# Patient Record
Sex: Female | Born: 1945 | Hispanic: No | Marital: Married | Smoking: Current every day smoker
Health system: Southern US, Community
[De-identification: ages and names within clinical notes are randomized; demographics above are authoritative.]

## PROBLEM LIST (undated history)

## (undated) DIAGNOSIS — F419 Anxiety disorder, unspecified: Secondary | ICD-10-CM

## (undated) DIAGNOSIS — I10 Essential (primary) hypertension: Secondary | ICD-10-CM

## (undated) DIAGNOSIS — D381 Neoplasm of uncertain behavior of trachea, bronchus and lung: Secondary | ICD-10-CM

## (undated) DIAGNOSIS — Z87442 Personal history of urinary calculi: Secondary | ICD-10-CM

## (undated) DIAGNOSIS — K219 Gastro-esophageal reflux disease without esophagitis: Secondary | ICD-10-CM

## (undated) DIAGNOSIS — N289 Disorder of kidney and ureter, unspecified: Secondary | ICD-10-CM

## (undated) DIAGNOSIS — C801 Malignant (primary) neoplasm, unspecified: Secondary | ICD-10-CM

## (undated) DIAGNOSIS — I2510 Atherosclerotic heart disease of native coronary artery without angina pectoris: Secondary | ICD-10-CM

## (undated) DIAGNOSIS — Z9289 Personal history of other medical treatment: Secondary | ICD-10-CM

## (undated) DIAGNOSIS — M8580 Other specified disorders of bone density and structure, unspecified site: Secondary | ICD-10-CM

## (undated) DIAGNOSIS — M1990 Unspecified osteoarthritis, unspecified site: Secondary | ICD-10-CM

## (undated) HISTORY — DX: Neoplasm of uncertain behavior of trachea, bronchus and lung: D38.1

## (undated) HISTORY — PX: SKIN GRAFT: SHX250

## (undated) HISTORY — DX: Other specified disorders of bone density and structure, unspecified site: M85.80

## (undated) HISTORY — DX: Gastro-esophageal reflux disease without esophagitis: K21.9

---
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
## 2004-12-27 ENCOUNTER — Ambulatory Visit: Payer: Self-pay | Admitting: Internal Medicine

## 2005-01-08 ENCOUNTER — Ambulatory Visit: Payer: Self-pay | Admitting: Internal Medicine

## 2013-08-24 ENCOUNTER — Encounter (HOSPITAL_BASED_OUTPATIENT_CLINIC_OR_DEPARTMENT_OTHER): Payer: Self-pay | Admitting: Emergency Medicine

## 2013-08-24 ENCOUNTER — Emergency Department (HOSPITAL_BASED_OUTPATIENT_CLINIC_OR_DEPARTMENT_OTHER)
Admission: EM | Admit: 2013-08-24 | Discharge: 2013-08-24 | Disposition: A | Discharge status: Other Acute Inpatient Hospital | Payer: Medicare Other | Attending: Emergency Medicine | Admitting: Emergency Medicine

## 2013-08-24 ENCOUNTER — Emergency Department (HOSPITAL_BASED_OUTPATIENT_CLINIC_OR_DEPARTMENT_OTHER): Payer: Medicare Other

## 2013-08-24 DIAGNOSIS — K529 Noninfective gastroenteritis and colitis, unspecified: Secondary | ICD-10-CM

## 2013-08-24 DIAGNOSIS — R42 Dizziness and giddiness: Secondary | ICD-10-CM | POA: Insufficient documentation

## 2013-08-24 DIAGNOSIS — K5289 Other specified noninfective gastroenteritis and colitis: Secondary | ICD-10-CM | POA: Insufficient documentation

## 2013-08-24 DIAGNOSIS — F17200 Nicotine dependence, unspecified, uncomplicated: Secondary | ICD-10-CM | POA: Insufficient documentation

## 2013-08-24 LAB — BASIC METABOLIC PANEL
BUN: 20 mg/dL (ref 6–23)
CHLORIDE: 99 meq/L (ref 96–112)
CO2: 30 meq/L (ref 19–32)
Calcium: 10.1 mg/dL (ref 8.4–10.5)
Creatinine, Ser: 0.7 mg/dL (ref 0.50–1.10)
GFR calc Af Amer: >90 mL/min (ref >90)
GFR calc non Af Amer: 86 mL/min — ABNORMAL LOW (ref >90)
Glucose, Bld: 171 mg/dL — ABNORMAL HIGH (ref 70–99)
POTASSIUM: 3.8 meq/L (ref 3.7–5.3)
Sodium: 146 mEq/L (ref 137–147)

## 2013-08-24 LAB — CBC WITH DIFFERENTIAL/PLATELET
BASOS ABS: 0 10*3/uL (ref 0.0–0.1)
BASOS PCT: 0 % (ref 0–1)
Eosinophils Absolute: 0 10*3/uL (ref 0.0–0.7)
Eosinophils Relative: 0 % (ref 0–5)
HCT: 42.9 % (ref 36.0–46.0)
HEMOGLOBIN: 14 g/dL (ref 12.0–15.0)
LYMPHS PCT: 4 % — AB (ref 12–46)
Lymphs Abs: 0.8 10*3/uL (ref 0.7–4.0)
MCH: 30.6 pg (ref 26.0–34.0)
MCHC: 32.6 g/dL (ref 30.0–36.0)
MCV: 93.9 fL (ref 78.0–100.0)
MONOS PCT: 4 % (ref 3–12)
Monocytes Absolute: 0.8 10*3/uL (ref 0.1–1.0)
NEUTROS ABS: 17.3 10*3/uL — AB (ref 1.7–7.7)
NEUTROS PCT: 91 % — AB (ref 43–77)
Platelets: 496 10*3/uL — ABNORMAL HIGH (ref 150–400)
RBC: 4.57 MIL/uL (ref 3.87–5.11)
RDW: 12.9 % (ref 11.5–15.5)
WBC: 19 10*3/uL — ABNORMAL HIGH (ref 4.0–10.5)

## 2013-08-24 LAB — URINALYSIS, ROUTINE W REFLEX MICROSCOPIC
Bilirubin Urine: NEGATIVE
Glucose, UA: NEGATIVE mg/dL
Hgb urine dipstick: NEGATIVE
Ketones, ur: NEGATIVE mg/dL
LEUKOCYTES UA: NEGATIVE
NITRITE: NEGATIVE
PH: 8.5 — AB (ref 5.0–8.0)
Protein, ur: NEGATIVE mg/dL
SPECIFIC GRAVITY, URINE: 1.01 (ref 1.005–1.030)
Urobilinogen, UA: 0.2 mg/dL (ref 0.0–1.0)

## 2013-08-24 MED ORDER — ONDANSETRON HCL 4 MG/2ML IJ SOLN
4.0000 mg | Freq: Once | INTRAMUSCULAR | Status: AC
  Administered 2013-08-24: 4 mg via INTRAVENOUS

## 2013-08-24 MED ORDER — MORPHINE SULFATE 4 MG/ML IJ SOLN
INTRAMUSCULAR | Status: AC
  Administered 2013-08-24: 4 mg
  Filled 2013-08-24: qty 1

## 2013-08-24 MED ORDER — ONDANSETRON HCL 4 MG/2ML IJ SOLN
4.0000 mg | Freq: Once | INTRAMUSCULAR | Status: AC
  Administered 2013-08-24: 4 mg via INTRAVENOUS
  Filled 2013-08-24: qty 2

## 2013-08-24 MED ORDER — MORPHINE SULFATE 2 MG/ML IJ SOLN
2.0000 mg | Freq: Once | INTRAMUSCULAR | Status: AC
  Administered 2013-08-24: 2 mg via INTRAVENOUS
  Filled 2013-08-24: qty 1

## 2013-08-24 MED ORDER — SODIUM CHLORIDE 0.9 % IV BOLUS (SEPSIS)
1000.0000 mL | Freq: Once | INTRAVENOUS | Status: AC
  Administered 2013-08-24: 1000 mL via INTRAVENOUS

## 2013-08-24 MED ORDER — ONDANSETRON HCL 4 MG/2ML IJ SOLN
INTRAMUSCULAR | Status: AC
  Administered 2013-08-24: 4 mg via INTRAVENOUS
  Filled 2013-08-24: qty 2

## 2013-08-24 NOTE — ED Notes (Signed)
Pt and husband developed vomiting and diarrhea at 10 pm Tuesday night. Denies fever, c/o abd cramping.

## 2013-08-24 NOTE — ED Provider Notes (Signed)
CSN: [id number]     Arrival date & time 08/24/13  0151 History   First MD Initiated Contact with Patient 08/24/13 [phone]     Chief Complaint  Patient presents with  . Emesis     (Consider location/radiation/quality/duration/timing/severity/associated sxs/prior Treatment) HPI Comments: PT comes in with cc of emesis, diarrhea. Pt started having some crampy abd pain, followed by multiple episodes of emesis. Pt has 10+ episodes of emesis already, and 5+ episodes of stools, loose. No blood in either one. Pt is also having chills and is feeling weak. Husband is having similar sx.   Patient is a 68 y.o. female presenting with vomiting. The history is provided by the patient.  Emesis Associated symptoms: abdominal pain, chills and diarrhea   Associated symptoms: no headaches     History reviewed. No pertinent past medical history. History reviewed. No pertinent past surgical history. No family history on file. History  Substance Use Topics  . Smoking status: Current Some Day Smoker  . Smokeless tobacco: Not on file  . Alcohol Use: Yes   OB History   Grav Para Term Preterm Abortions TAB SAB Ect Mult Living                 Review of Systems  Constitutional: Positive for chills and activity change. Negative for fever.  Respiratory: Negative for shortness of breath.   Cardiovascular: Negative for chest pain.  Gastrointestinal: Positive for nausea, vomiting, abdominal pain and diarrhea.  Genitourinary: Negative for dysuria.  Musculoskeletal: Negative for neck pain.  Skin: Negative for rash.  Neurological: Positive for weakness and light-headedness. Negative for headaches.  All other systems reviewed and are negative.      Allergies  Review of patient's allergies indicates no known allergies.  Home Medications  No current outpatient prescriptions on file. BP 156/91  Pulse 92  Temp(Src) 98.4 F (36.9 C) (Oral)  Resp 22  Ht 5\' 1"  (1.549 m)  Wt 100 lb (45.36 kg)  BMI 18.90  kg/m2  SpO2 95% Physical Exam  Nursing note and vitals reviewed. Constitutional: She is oriented to person, place, and time. She appears well-developed and well-nourished.  HENT:  Head: Normocephalic and atraumatic.  Eyes: EOM are normal. Pupils are equal, round, and reactive to light.  Neck: Neck supple.  Cardiovascular: Normal rate, regular rhythm and normal heart sounds.   No murmur heard. Pulmonary/Chest: Effort normal. No respiratory distress.  Abdominal: Soft. She exhibits no distension. There is no tenderness. There is no rebound and no guarding.  Neurological: She is alert and oriented to person, place, and time.  Skin: Skin is warm and dry.    ED Course  Procedures (including critical care time) Labs Review Labs Reviewed  CBC WITH DIFFERENTIAL - Abnormal; Notable for the following:    WBC 19.0 (*)    Platelets 496 (*)    Neutrophils Relative % 91 (*)    Neutro Abs 17.3 (*)    Lymphocytes Relative 4 (*)    All other components within normal limits  BASIC METABOLIC PANEL - Abnormal; Notable for the following:    Glucose, Bld 171 (*)    GFR calc non Af Amer 86 (*)    All other components within normal limits  URINALYSIS, ROUTINE W REFLEX MICROSCOPIC - Abnormal; Notable for the following:    pH 8.5 (*)    All other components within normal limits  CLOSTRIDIUM DIFFICILE BY PCR   Imaging Review No results found.  EKG Interpretation   None  MDM   Final diagnoses:  None    Pt comes in with cc of abd pain, nausea, emesis, diarrhea. Several episodes of emesis and diarrhea already. Pt's abd exam is benign. This appears to be viral gastro / food poisoning clinically - given that husband is having similar sx just now. CBC is showing WBC > 19K. Repeat exam - still no peritoneal signs. Oral challenge started, but patient is feeling unwell still, so we will admit for n/v/dehydration. No indication for CT imaging at this time. No dysentery, no fevers so no  antibiotics started    Varney Biles, MD 08/24/13 [id number]

## 2014-07-07 HISTORY — PX: CATARACT EXTRACTION W/ INTRAOCULAR LENS  IMPLANT, BILATERAL: SHX1307

## 2014-11-02 ENCOUNTER — Encounter: Payer: Self-pay | Admitting: Internal Medicine

## 2015-04-03 ENCOUNTER — Other Ambulatory Visit: Payer: Self-pay | Admitting: Obstetrics and Gynecology

## 2015-04-03 DIAGNOSIS — Z139 Encounter for screening, unspecified: Secondary | ICD-10-CM

## 2015-05-21 ENCOUNTER — Encounter: Payer: Self-pay | Admitting: Gastroenterology

## 2015-07-20 ENCOUNTER — Ambulatory Visit (AMBULATORY_SURGERY_CENTER): Payer: Self-pay | Admitting: *Deleted

## 2015-07-20 ENCOUNTER — Other Ambulatory Visit: Payer: Self-pay | Admitting: Gastroenterology

## 2015-07-20 VITALS — Ht 61.0 in | Wt 96.2 lb

## 2015-07-20 DIAGNOSIS — Z1211 Encounter for screening for malignant neoplasm of colon: Secondary | ICD-10-CM

## 2015-07-20 NOTE — Progress Notes (Signed)
No allergies to eggs or soy. No problems with anesthesia.  Pt given Emmi instructions for colonoscopy  No oxygen use  No diet drug use  

## 2015-08-03 ENCOUNTER — Ambulatory Visit (AMBULATORY_SURGERY_CENTER): Payer: Medicare HMO | Admitting: Gastroenterology

## 2015-08-03 ENCOUNTER — Encounter (HOSPITAL_BASED_OUTPATIENT_CLINIC_OR_DEPARTMENT_OTHER): Payer: Self-pay | Admitting: Emergency Medicine

## 2015-08-03 ENCOUNTER — Emergency Department (HOSPITAL_BASED_OUTPATIENT_CLINIC_OR_DEPARTMENT_OTHER)
Admission: EM | Admit: 2015-08-03 | Discharge: 2015-08-03 | Disposition: A | Discharge status: Home / Self Care | Payer: Medicare HMO | Attending: Emergency Medicine | Admitting: Emergency Medicine

## 2015-08-03 ENCOUNTER — Telehealth: Payer: Self-pay | Admitting: Gastroenterology

## 2015-08-03 ENCOUNTER — Emergency Department (HOSPITAL_BASED_OUTPATIENT_CLINIC_OR_DEPARTMENT_OTHER): Payer: Medicare HMO

## 2015-08-03 ENCOUNTER — Encounter: Payer: Self-pay | Admitting: Gastroenterology

## 2015-08-03 VITALS — BP 186/97 | HR 92 | Temp 98.2°F | Resp 20 | Ht 61.0 in | Wt 96.0 lb

## 2015-08-03 DIAGNOSIS — Z1211 Encounter for screening for malignant neoplasm of colon: Secondary | ICD-10-CM | POA: Diagnosis present

## 2015-08-03 DIAGNOSIS — Z8739 Personal history of other diseases of the musculoskeletal system and connective tissue: Secondary | ICD-10-CM | POA: Insufficient documentation

## 2015-08-03 DIAGNOSIS — R112 Nausea with vomiting, unspecified: Secondary | ICD-10-CM

## 2015-08-03 DIAGNOSIS — K219 Gastro-esophageal reflux disease without esophagitis: Secondary | ICD-10-CM | POA: Insufficient documentation

## 2015-08-03 DIAGNOSIS — Z79899 Other long term (current) drug therapy: Secondary | ICD-10-CM | POA: Insufficient documentation

## 2015-08-03 DIAGNOSIS — R079 Chest pain, unspecified: Secondary | ICD-10-CM | POA: Diagnosis not present

## 2015-08-03 DIAGNOSIS — F17210 Nicotine dependence, cigarettes, uncomplicated: Secondary | ICD-10-CM | POA: Diagnosis not present

## 2015-08-03 DIAGNOSIS — R109 Unspecified abdominal pain: Secondary | ICD-10-CM | POA: Insufficient documentation

## 2015-08-03 LAB — CBC WITH DIFFERENTIAL/PLATELET
BASOS ABS: 0 10*3/uL (ref 0.0–0.1)
BASOS PCT: 0 %
EOS ABS: 0 10*3/uL (ref 0.0–0.7)
Eosinophils Relative: 0 %
HEMATOCRIT: 43.4 % (ref 36.0–46.0)
HEMOGLOBIN: 14 g/dL (ref 12.0–15.0)
Lymphocytes Relative: 11 %
Lymphs Abs: 1.1 10*3/uL (ref 0.7–4.0)
MCH: 29.9 pg (ref 26.0–34.0)
MCHC: 32.3 g/dL (ref 30.0–36.0)
MCV: 92.5 fL (ref 78.0–100.0)
MONO ABS: 0.2 10*3/uL (ref 0.1–1.0)
MONOS PCT: 2 %
NEUTROS ABS: 9.2 10*3/uL — AB (ref 1.7–7.7)
NEUTROS PCT: 87 %
Platelets: 359 10*3/uL (ref 150–400)
RBC: 4.69 MIL/uL (ref 3.87–5.11)
RDW: 13 % (ref 11.5–15.5)
WBC: 10.5 10*3/uL (ref 4.0–10.5)

## 2015-08-03 LAB — TROPONIN I

## 2015-08-03 LAB — BASIC METABOLIC PANEL
ANION GAP: 14 (ref 5–15)
BUN: 12 mg/dL (ref 6–20)
CALCIUM: 9.3 mg/dL (ref 8.9–10.3)
CO2: 25 mmol/L (ref 22–32)
CREATININE: 0.58 mg/dL (ref 0.44–1.00)
Chloride: 101 mmol/L (ref 101–111)
Glucose, Bld: 143 mg/dL — ABNORMAL HIGH (ref 65–99)
Potassium: 3.6 mmol/L (ref 3.5–5.1)
Sodium: 140 mmol/L (ref 135–145)

## 2015-08-03 MED ORDER — METOCLOPRAMIDE HCL 5 MG/ML IJ SOLN
10.0000 mg | Freq: Once | INTRAMUSCULAR | Status: AC
  Administered 2015-08-03: 10 mg via INTRAVENOUS
  Filled 2015-08-03: qty 2

## 2015-08-03 MED ORDER — SODIUM CHLORIDE 0.9 % IV SOLN
Freq: Once | INTRAVENOUS | Status: AC
  Administered 2015-08-03: 500 mL via INTRAVENOUS

## 2015-08-03 MED ORDER — SODIUM CHLORIDE 0.9 % IV SOLN: 500.0000 mL | INTRAVENOUS | Status: DC

## 2015-08-03 MED ORDER — PROMETHAZINE HCL 12.5 MG PO TABS: 12.5000 mg | ORAL_TABLET | Freq: Three times a day (TID) | ORAL | Status: DC | PRN

## 2015-08-03 MED ORDER — SODIUM CHLORIDE 0.9 % IV BOLUS (SEPSIS)
1000.0000 mL | Freq: Once | INTRAVENOUS | Status: AC
Start: 2015-08-03 — End: 2015-08-03
  Administered 2015-08-03: 1000 mL via INTRAVENOUS

## 2015-08-03 MED ORDER — PROMETHAZINE HCL 25 MG/ML IJ SOLN
12.5000 mg | Freq: Once | INTRAMUSCULAR | Status: AC
  Administered 2015-08-03: 12.5 mg via INTRAVENOUS
  Filled 2015-08-03: qty 1

## 2015-08-03 MED ORDER — PANTOPRAZOLE SODIUM 40 MG IV SOLR
40.0000 mg | Freq: Once | INTRAVENOUS | Status: AC
  Administered 2015-08-03: 40 mg via INTRAVENOUS
  Filled 2015-08-03: qty 40

## 2015-08-03 NOTE — ED Notes (Signed)
Pt able to eat crackers and drink ginger ale

## 2015-08-03 NOTE — Progress Notes (Signed)
Pt came into recovery awake but could not pass any gas, adb was soft non distended, had pt try different techniques to relieve the air, pt started to feel nausea and c/o abd cramping, Dr Janey Greaser came in gave report, findings and ordered a dose of simethicone for pt, simethicone was given to pt, pt was on left side and said she thought she was getting better but still had not passed any gas, abd was still soft non distended, pt still continued to have discomfort in recovery after all techniques were tried to help relieve the gas, pt began to vomit in recovery, pt was sat up in bed and warm wash cloth was given, Dr Silverio Decamp was notified of pt condition, Dr advised that pt was very tiny and thought the smallest amount of air in the colon was the culprit of the pt problems, advised to have pt get dressed and walk, once pt felt better pt was ambulated around recovery by Shelia C transporter, pt was then escorted to restroom where she reports she was able to pass a little air out of her rectum, pt was allowed to get dressed at this point, while pt was getting dressed pt began to vomit again, pt was sat in quiet room to rest, pt as walked again in recovery when she thought she felt better, she then threw up again in recovery, (pt had not been given medications due to her being allergic to zofran and not having an IV) pt was then sat back in quiet room and given ice chips, pt was there for about 15 more mins then stated that she was ready to go home, pt was discharged at this time.

## 2015-08-03 NOTE — Telephone Encounter (Signed)
Called pt back to check on her, as I was speaking with pt she began to vomit again, advised pt she would probably need to go to the ED but I would check with Dr Silverio Decamp and call her back, spoke with Dr Silverio Decamp and she wants her to go to ED since pt can not keep liquids down, advised pt of this, pt verbalized understanding-adm

## 2015-08-03 NOTE — ED Notes (Signed)
Pt had colonscopy this am and has had continuous vomiting and nausea since. Pt states she feels weak and shaky.denies pain.

## 2015-08-03 NOTE — ED Provider Notes (Signed)
CSN: [id number]     Arrival date & time 08/03/15  1734 History   First MD Initiated Contact with Patient 08/03/15 1740     Chief Complaint  Patient presents with  . Emesis     (Consider location/radiation/quality/duration/timing/severity/associated sxs/prior Treatment) HPI Comments: Patient presents with nausea vomiting. She had a colonoscopy this morning at [facility]. She states that while she was in the recovery room she started having nausea and vomiting. She wasn't able to pass gas initially. She was given simethicone. She was eventually able to pass some gas but had ongoing nausea and vomiting. She states that she continued to have vomiting at home. A prescription for Phenergan was called into the pharmacy. Patient is unable to take Zofran as she states that it makes her vomit more. She took a dose of Phenergan about 25 minutes prior to arrival but had vomiting following this. She denies any past issues with anesthesia. She thinks that she was given fentanyl and Versed for the anesthesia today. She denies any abdominal pain. She does say that she's passing gas. She has had some discomfort to the center of her chest. She states it hurts primarily with vomiting. She denies any exertional symptoms. No associated shortness of breath.   Past Medical History  Diagnosis Date  . GERD (gastroesophageal reflux disease)   . Blood transfusion without reported diagnosis 1963    after tonsillectomy   . Osteopenia    Past Surgical History  Procedure Laterality Date  . Tonsillectomy  1963  . Cataract extraction w/ intraocular lens  implant, bilateral  2016  . Cesarean section  1980, 1974   Family History  Problem Relation Age of Onset  . Colon cancer Neg Hx    Social History  Substance Use Topics  . Smoking status: Current Some Day Smoker    Types: Cigarettes  . Smokeless tobacco: Never Used     Comment: smokes 4 cigarettes a day  . Alcohol Use: 2.4 oz/week    4 Standard drinks  or equivalent per week   OB History    No data available     Review of Systems  Constitutional: Negative for fever, chills, diaphoresis and fatigue.  HENT: Negative for congestion, rhinorrhea and sneezing.   Eyes: Negative.   Respiratory: Negative for cough, chest tightness and shortness of breath.   Cardiovascular: Negative for chest pain and leg swelling.  Gastrointestinal: Positive for nausea and vomiting. Negative for abdominal pain, diarrhea and blood in stool.  Genitourinary: Negative for frequency, hematuria, flank pain and difficulty urinating.  Musculoskeletal: Negative for back pain and arthralgias.  Skin: Negative for rash.  Neurological: Negative for dizziness, speech difficulty, weakness, numbness and headaches.      Allergies  Zofran and Morphine and related  Home Medications   Prior to Admission medications   Medication Sig Start Date End Date Taking? Authorizing Provider  alendronate (FOSAMAX) 35 MG tablet TAKE 1 TABLET BY MOUTH ONCE WEEKLY ON AN EMPTY STOMACH 30-60 MINUTES BEFORE EATING 06/25/15   Historical Provider, MD  BuPROPion HCl (WELLBUTRIN PO) Take by mouth 2 (two) times daily.    Historical Provider, MD  Calcium-Magnesium-Vitamin D (CALCIUM 500 PO) Take by mouth.    Historical Provider, MD  Multiple Vitamins-Minerals (EYE VITAMINS PO) Take by mouth daily.    Historical Provider, MD  Omega-3 Fatty Acids (FISH OIL PO) Take by mouth daily.    Historical Provider, MD  promethazine (PHENERGAN) 12.5 MG tablet Take 1 tablet (12.5 mg total) by  mouth every 8 (eight) hours as needed for nausea or vomiting. 08/03/15   Mauri Pole, MD  RaNITidine HCl (ZANTAC 75 PO) Take by mouth 2 (two) times daily.    Historical Provider, MD   BP 163/70 mmHg  Pulse 99  Temp(Src) 97.9 F (36.6 C) (Oral)  Resp 19  Wt 96 lb (43.545 kg)  SpO2 97% Physical Exam  Constitutional: She is oriented to person, place, and time. She appears well-developed and well-nourished.   HENT:  Head: Normocephalic and atraumatic.  Eyes: Pupils are equal, round, and reactive to light.  Neck: Normal range of motion. Neck supple.  Cardiovascular: Normal rate, regular rhythm and normal heart sounds.   Pulmonary/Chest: Effort normal and breath sounds normal. No respiratory distress. She has no wheezes. She has no rales. She exhibits no tenderness.  Abdominal: Soft. Bowel sounds are normal. She exhibits distension (Mild distention without tenderness). There is no tenderness. There is no rebound and no guarding.  Musculoskeletal: Normal range of motion. She exhibits no edema.  Lymphadenopathy:    She has no cervical adenopathy.  Neurological: She is alert and oriented to person, place, and time.  Skin: Skin is warm and dry. No rash noted.  Psychiatric: She has a normal mood and affect.    ED Course  Procedures (including critical care time) Labs Review Labs Reviewed  BASIC METABOLIC PANEL - Abnormal; Notable for the following:    Glucose, Bld 143 (*)    All other components within normal limits  CBC WITH DIFFERENTIAL/PLATELET - Abnormal; Notable for the following:    Neutro Abs 9.2 (*)    All other components within normal limits  TROPONIN I    Imaging Review Dg Abd Acute W/chest  08/03/2015  CLINICAL DATA:  Colonoscopy this morning. Continuous vomiting and nausea. EXAM: DG ABDOMEN ACUTE W/ 1V CHEST COMPARISON:  08/24/2013 FINDINGS: Diffuse gaseous distention of the colon. Gas within mildly prominent distal small bowel. No free air. No organomegaly or suspicious calcification. Heart and mediastinal contours are within normal limits. No focal opacities or effusions. No acute bony abnormality. IMPRESSION: Mild diffuse gaseous distention of the colon, likely related to earlier colonoscopy. No free air. No acute cardiopulmonary disease. Electronically Signed   By: Rolm Baptise M.D.   On: 08/03/2015 18:41   I have personally reviewed and evaluated these images and lab results  as part of my medical decision-making.   EKG Interpretation   Date/Time:  Friday August 03 2015 18:58:34 EST Ventricular Rate:  97 PR Interval:  141 QRS Duration: 95 QT Interval:  389 QTC Calculation: 494 R Axis:   58 Text Interpretation:  Sinus rhythm Right atrial enlargement Nonspecific  repol abnormality, diffuse leads since last tracing no significant change  Confirmed by Arizbeth Cawthorn  MD, Adalaya Irion ([id number]) on 08/03/2015 7:00:49 PM      MDM   Final diagnoses:  Non-intractable vomiting with nausea, vomiting of unspecified type    Patient presents with nausea and vomiting after colonoscopy this morning. The vomiting started just after the procedure. I feel this is most likely related to the sedation and procedure itself. She has no abdominal pain that would be more concerning for perforation. I did do an x-ray which doesn't show any evidence of free air. She did have some associated chest pain but she states the chest pain is only with vomiting. There is no exertional symptoms. She did not have any complaints of chest pain throughout her ED course. No associated SOB. She did have an  EKG which showed some ST depression. I don't have any old EKGs for comparison. Given this, I did check a troponin which was negative. She doesn't have other symptoms that would be more consistent with acute coronary syndrome. She was given IV fluids and antiemetics in the ED. She currently is feeling back to baseline. She denies any nausea. She has been able to drink fluids and eat some crackers without vomiting. She feels like she is ready to go home. Her repeat abdominal exam was benign without any tenderness. She was discharged home in good condition. I did advise her to return if she has ongoing vomiting or any abdominal pain.    Malvin Johns, MD 08/03/15 2220

## 2015-08-03 NOTE — Discharge Instructions (Signed)

## 2015-08-03 NOTE — Op Note (Signed)
[facility]  [address]. [place], [id number]   COLONOSCOPY PROCEDURE REPORT  PATIENT: Leslie Boyd, Leslie Boyd  MR#: [id number] BIRTHDATE: Aug 05, 1945 , 69  yrs. old GENDER: female ENDOSCOPIST: Harl Bowie, MD REFERRED IH:[id number] Suzy Bouchard, MD PROCEDURE DATE:  08/03/2015 PROCEDURE:   Colonoscopy, screening First Screening Colonoscopy - Avg.  risk and is 50 yrs.  old or older - No.  Prior Negative Screening - Now for repeat screening. 10 or more years since last screening  History of Adenoma - Now for follow-up colonoscopy & has been > or = to 3 yrs.  N/A  Polyps removed today? No Recommend repeat exam, <10 yrs? No ASA CLASS:   Class I INDICATIONS:Screening for colonic neoplasia and Colorectal Neoplasm Risk Assessment for this procedure is average risk. MEDICATIONS: Propofol 240 mg IV  DESCRIPTION OF PROCEDURE:   After the risks benefits and alternatives of the procedure were thoroughly explained, informed consent was obtained.  The digital rectal exam revealed no abnormalities of the rectum.   The [id number]  endoscope was introduced through the anus and advanced to the terminal ileum which was intubated for a short distance. No adverse events experienced.   The quality of the prep was good.  The instrument was then slowly withdrawn as the colon was fully examined. Estimated blood loss is zero unless otherwise noted in this procedure report.   COLON FINDINGS: A normal appearing cecum, ileocecal valve, and appendiceal orifice were identified.  The ascending, transverse, descending, sigmoid colon, and rectum appeared unremarkable.   The examined terminal ileum appeared to be normal.  Retroflexed views revealed no abnormalities other than prominent anal papilla The time to cecum = 14.3 Withdrawal time = 6.3   The scope was withdrawn and the procedure completed. COMPLICATIONS: There were no immediate complications.  ENDOSCOPIC IMPRESSION: 1.   Normal  colonoscopy 2.   The examined terminal ileum appeared to be normal  RECOMMENDATIONS: You should continue to follow colorectal cancer screening guidelines for "routine risk" patients with a repeat colonoscopy in 10 years. There is no need for FOBT (stool) testing for at least 5 years.  eSigned:  Harl Bowie, MD 08/03/2015 10:05 AM

## 2015-08-03 NOTE — Progress Notes (Signed)
approx right at end (retroflextion), pt had one spell of vomitus.  Head was immediatley lowere and mouth suctioned and got nothing.  Sedation was stopped and DR N aware.  Pt allowed to wake up in OR until she responded to questions and no problems reported.  I did counsel pt on a possible sore throat and why her dentures may be loose (suctioning).  April PACU RN also notifeid  Report to PACU, RN, vss, BBS= Clear.

## 2015-08-03 NOTE — ED Notes (Signed)
Pt states she is feeling better .

## 2015-08-03 NOTE — Patient Instructions (Signed)
YOU HAD AN ENDOSCOPIC PROCEDURE TODAY AT [facility]:   Refer to the procedure report that was given to you for any specific questions about what was found during the examination.  If the procedure report does not answer your questions, please call your gastroenterologist to clarify.  If you requested that your care partner not be given the details of your procedure findings, then the procedure report has been included in a sealed envelope for you to review at your convenience later.  YOU SHOULD EXPECT: Some feelings of bloating in the abdomen. Passage of more gas than usual.  Walking can help get rid of the air that was put into your GI tract during the procedure and reduce the bloating. If you had a lower endoscopy (such as a colonoscopy or flexible sigmoidoscopy) you may notice spotting of blood in your stool or on the toilet paper. If you underwent a bowel prep for your procedure, you may not have a normal bowel movement for a few days.  Please Note:  You might notice some irritation and congestion in your nose or some drainage.  This is from the oxygen used during your procedure.  There is no need for concern and it should clear up in a day or so.  SYMPTOMS TO REPORT IMMEDIATELY:   Following lower endoscopy (colonoscopy or flexible sigmoidoscopy):  Excessive amounts of blood in the stool  Significant tenderness or worsening of abdominal pains  Swelling of the abdomen that is new, acute  Fever of 100F or higher   For urgent or emergent issues, a gastroenterologist can be reached at any hour by calling [phone].   DIET: Your first meal following the procedure should be a small meal and then it is ok to progress to your normal diet. Heavy or fried foods are harder to digest and may make you feel nauseous or bloated.  Likewise, meals heavy in dairy and vegetables can increase bloating.  Drink plenty of fluids but you should avoid alcoholic beverages for 24  hours.  ACTIVITY:  You should plan to take it easy for the rest of today and you should NOT DRIVE or use heavy machinery until tomorrow (because of the sedation medicines used during the test).    FOLLOW UP: Our staff will call the number listed on your records the next business day following your procedure to check on you and address any questions or concerns that you may have regarding the information given to you following your procedure. If we do not reach you, we will leave a message.  However, if you are feeling well and you are not experiencing any problems, there is no need to return our call.  We will assume that you have returned to your regular daily activities without incident.  If any biopsies were taken you will be contacted by phone or by letter within the next 1-3 weeks.  Please call us at [phone] if you have not heard about the biopsies in 3 weeks.    SIGNATURES/CONFIDENTIALITY: You and/or your care partner have signed paperwork which will be entered into your electronic medical record.  These signatures attest to the fact that that the information above on your After Visit Summary has been reviewed and is understood.  Full responsibility of the confidentiality of this discharge information lies with you and/or your care-partner.  Normal exam  Repeat colonoscopy in 10 years-2027.

## 2015-08-03 NOTE — Telephone Encounter (Addendum)
Call pt and spoke with her about her vomiting, pt states she is still having "dry heaves" and can not keep anything down, pt states she can barely get out of bed she is so weak, adv pt I would speak with Dr Silverio Decamp and call her back, have tried to call pt multiple times but no one answers.  finally got back in touch with pt, her cell phone was acting up, advised pt prescription for phenergan was sent to pharmacy, and for her to take the phenergan then wait 20 mins and try to drink something like gatorade, pt verbalized understanding of instruction, will check on pt before 5pm-adm

## 2015-08-06 ENCOUNTER — Telehealth: Payer: Self-pay | Admitting: *Deleted

## 2015-08-06 NOTE — Telephone Encounter (Signed)
Name identifier, left message, follow-up 

## 2015-08-07 ENCOUNTER — Telehealth: Payer: Self-pay | Admitting: Gastroenterology

## 2016-03-17 DIAGNOSIS — K30 Functional dyspepsia: Secondary | ICD-10-CM | POA: Insufficient documentation

## 2016-03-17 DIAGNOSIS — F4329 Adjustment disorder with other symptoms: Secondary | ICD-10-CM | POA: Insufficient documentation

## 2016-03-17 DIAGNOSIS — M8580 Other specified disorders of bone density and structure, unspecified site: Secondary | ICD-10-CM | POA: Insufficient documentation

## 2016-07-18 DIAGNOSIS — E7800 Pure hypercholesterolemia, unspecified: Secondary | ICD-10-CM | POA: Insufficient documentation

## 2016-09-01 DIAGNOSIS — F17200 Nicotine dependence, unspecified, uncomplicated: Secondary | ICD-10-CM | POA: Insufficient documentation

## 2016-09-01 DIAGNOSIS — I10 Essential (primary) hypertension: Secondary | ICD-10-CM | POA: Insufficient documentation

## 2016-09-01 DIAGNOSIS — R911 Solitary pulmonary nodule: Secondary | ICD-10-CM | POA: Insufficient documentation

## 2017-07-15 DIAGNOSIS — I2510 Atherosclerotic heart disease of native coronary artery without angina pectoris: Secondary | ICD-10-CM | POA: Insufficient documentation

## 2017-07-21 DIAGNOSIS — E559 Vitamin D deficiency, unspecified: Secondary | ICD-10-CM | POA: Insufficient documentation

## 2017-12-29 ENCOUNTER — Encounter: Payer: Self-pay | Admitting: *Deleted

## 2017-12-29 DIAGNOSIS — F1910 Other psychoactive substance abuse, uncomplicated: Secondary | ICD-10-CM | POA: Insufficient documentation

## 2017-12-29 DIAGNOSIS — D381 Neoplasm of uncertain behavior of trachea, bronchus and lung: Secondary | ICD-10-CM | POA: Insufficient documentation

## 2017-12-30 ENCOUNTER — Institutional Professional Consult (permissible substitution) (INDEPENDENT_AMBULATORY_CARE_PROVIDER_SITE_OTHER): Payer: Medicare HMO | Admitting: Thoracic Surgery (Cardiothoracic Vascular Surgery)

## 2017-12-30 VITALS — BP 124/70 | HR 80 | Resp 20 | Ht 61.0 in | Wt 100.0 lb

## 2017-12-30 DIAGNOSIS — R911 Solitary pulmonary nodule: Secondary | ICD-10-CM | POA: Diagnosis not present

## 2017-12-30 NOTE — Progress Notes (Signed)
PCP is Nicola Girt, DO Referring Provider is Nicola Girt, DO  Chief Complaint  Patient presents with  . Lung Lesion    Surgical eval, Chest CT's 10/29/17, 731/18, 08/04/16, PET Scan 03/23/17    HPI: Leslie Boyd is sent for consultation regarding a left upper lobe lung nodule.  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  She started smoking at age 22.  She is still smoking 1/2 pack/day.  She has been given a prescription for Chantix but has not yet stopped.  She started lung cancer screening in January 2018.  On her scans she was found to have a 13.8 mm mixed density nodule in the posterior left upper lobe.  There was a 5 mm solid component with mild retraction of the major fissure.  There was no mediastinal or adenopathy.  A short-term follow-up CT in 6 months was recommended.  That was unchanged.  She had a PET CT in September 2018 which showed no change in the nodule.  There was very low level metabolic activity with an SUV of 1.0.  She did not want a surgical referral at that time but wished to continue with observation a recent CT on 11/19/2017 showed the nodule was relatively stable although slightly increased in size from her initial scan.  She has been feeling well.  She has been very anxious about the nodule.  She is smoking about 1/2 pack daily.  She says that she sometimes gets some chest discomfort when she is anxious, but does not have any exertional discomfort.  She can walk without shortness of breath and would have no difficulty walking up a flight of stairs.  Her appetite is good.  Her weight is stable.  No headaches or visual changes.  Zubrod Score: At the time of surgery this patient's most appropriate activity status/level should be described as: [x]     0    Normal activity, no  symptoms []     1    Restricted in physical strenuous activity but ambulatory, able to do out light work []     2    Ambulatory and capable of self care, unable to do work activities, up and about >50 % of waking hours                              []     3    Only limited self care, in bed greater than 50% of waking hours []     4    Completely disabled, no self care, confined to bed or chair []     5    Moribund  Past Medical History:  Diagnosis Date  . Blood transfusion without reported diagnosis 1963   after tonsillectomy   . GERD (gastroesophageal reflux disease)   . 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 Osteopenia   . Substance abuse ([place])    episodic tobacco dependance    Past Surgical History:  Procedure Laterality Date  . CATARACT EXTRACTION W/ INTRAOCULAR LENS  IMPLANT, BILATERAL  2016  . [place]  . TONSILLECTOMY  1963    Family History  Problem Relation Age of Onset  . Heart disease Father   . COPD Sister   . Heart disease Brother   . Colon  cancer Neg Hx     Social History Social History   Tobacco Use  . Smoking status: Current Some Day Smoker    Types: Cigarettes  . Smokeless tobacco: Never Used  . Tobacco comment: smokes 4 cigarettes a day  Substance Use Topics  . Alcohol use: Yes    Alcohol/week: 2.4 oz    Types: 4 Standard drinks or equivalent per week  . Drug use: No    Current Outpatient Medications  Medication Sig Dispense Refill  . amLODipine (NORVASC) 5 MG tablet Take 5 mg by mouth daily.    Marland Kitchen aspirin EC 81 MG tablet Take 81 mg by mouth daily.    . calcium-vitamin D (OSCAL WITH D) 500-200 MG-UNIT tablet Take 1 tablet by mouth 2 (two) times daily.    . cetirizine (ZYRTEC) 10 MG tablet Take by mouth.    . Omega-3 1000 MG CAPS Take 1 capsule by mouth daily.    . pravastatin (PRAVACHOL) 20 MG tablet Take 20 mg by mouth daily.    . ranitidine (ZANTAC) 150 MG tablet Take 150 mg by mouth 2 (two) times daily.     . varenicline (CHANTIX) 0.5 MG tablet Take 0.5 mg by mouth 2 (two) times daily. Increase as instructed     No current facility-administered medications for this visit.     Allergies  Allergen Reactions  . Zofran [Ondansetron Hcl]   . Morphine And Related Nausea And Vomiting and Rash    Review of Systems  Constitutional: Negative for activity change, appetite change, fatigue and unexpected weight change.  HENT: Negative for trouble swallowing and voice change.   Respiratory: Positive for cough (Occasional). Negative for chest tightness, shortness of breath and wheezing.   Cardiovascular: Negative for chest pain and leg swelling.  Gastrointestinal: Positive for abdominal pain (Reflux). Negative for abdominal distention.  Genitourinary: Negative for difficulty urinating and dysuria.  Musculoskeletal: Positive for arthralgias.       Osteoporosis  Neurological: Negative for seizures, syncope and headaches.  Hematological: Negative for adenopathy. Bruises/bleeds easily.  Psychiatric/Behavioral: The patient is nervous/anxious.   All other systems reviewed and are negative.   BP 124/70   Pulse 80   Resp 20   Ht 5\' 1"  (1.549 m)   Wt 100 lb (45.4 kg)   SpO2 97% Comment: RA  BMI 18.89 kg/m  Physical Exam  Constitutional: She is oriented to person, place, and time. She appears well-developed and well-nourished. No distress.  HENT:  Head: Normocephalic and atraumatic.  Mouth/Throat: No oropharyngeal exudate.  Eyes: Conjunctivae and EOM are normal. No scleral icterus.  Neck: No thyromegaly present.  Cardiovascular: Normal rate, regular rhythm, normal heart sounds and intact distal pulses. Exam reveals no gallop and no friction rub.  No murmur heard. Pulmonary/Chest: Effort normal and breath sounds normal. No respiratory distress. She has no wheezes. She has no rales.  Abdominal: Soft. She exhibits no distension. There is no tenderness.  Musculoskeletal: She exhibits no edema.   Lymphadenopathy:    She has no cervical adenopathy.  Neurological: She is alert and oriented to person, place, and time. No cranial nerve deficit. She exhibits normal muscle tone. Coordination normal.  Skin: Skin is warm and dry.  Vitals reviewed.   Diagnostic Tests: I personally reviewed the CT and PET/CT images which are available through our PACS system despite not being available in EPIC.  I concur with the findings of a 1.3 cm solid/sub-solid nodule in the left upper lobe adjacent to and tenting the major  fissure.  Minimal activity on PET/CT. There is a smaller groundglass opacity in the superior segment of the left lower lobe.   Impression: Leslie Boyd is a 72 year old woman with a long history of tobacco abuse, hypertension, hyperlipidemia, anxiety, and osteoporosis.  She does not have a history of COPD.  She first had a lung cancer screening exam about 18 months ago.  A mixed density solid/sub-solid nodule was noted in the left upper lobe.  On follow-up scan this is increased slightly in size.  Had minimal activity on PET/CT about 9 months ago.  This nodule is highly suspicious for a low-grade adenocarcinoma and should be treated as such unless it can be proven otherwise.  Infectious and inflammatory nodules are also in the differential, but much less likely.  I discussed potential treatment modalities for this nodule with Leslie Boyd.  Options include surgical resection and stereotactic radiation.  We talked about the relative advantages and disadvantages of each of those approaches.  She is interested in pursuing surgical resection.  I recommended that we proceed with left VATS for wedge resection and possible lingular sparing left upper lobectomy.  I discussed the general nature of the procedure with her including the need for general anesthesia, the incisions to be used, the intraoperative decision making, the use of a drainage tube postoperatively, the expected hospital stay, and the  overall recovery.  I informed her of the indications, risks, benefits, and alternatives.  She understands the risks include, but are not limited to death, MI, DVT, PE, bleeding, possible need for transfusion, infection, prolonged air leak, cardiac arrhythmias, chronic pain, as well as possibility of other unforeseeable complications.  She accepts these risks and wishes to proceed.  Coronary atherosclerosis-no definite angina although she does have some chest discomfort with anxiety.  She does have a strong family history.  She was seen by Dr. Atilano Median who told her she would need a stress test prior to having surgery.  We will have her evaluated by Dr. Atilano Median.  She needs pulmonary function testing with and without bronchodilators prior to surgery.  Plan: Pulmonary function testing Cardiology evaluation by Dr. Atilano Median Left VATS for wedge resection and possible lingular sparing left upper lobectomy  [name], MD [facility] [phone]

## 2017-12-31 ENCOUNTER — Other Ambulatory Visit: Payer: Self-pay | Admitting: *Deleted

## 2017-12-31 DIAGNOSIS — R911 Solitary pulmonary nodule: Secondary | ICD-10-CM

## 2018-01-08 ENCOUNTER — Encounter (HOSPITAL_COMMUNITY): Payer: Medicare HMO

## 2018-01-13 ENCOUNTER — Ambulatory Visit (HOSPITAL_COMMUNITY)
Admission: RE | Admit: 2018-01-13 | Discharge: 2018-01-13 | Disposition: A | Discharge status: Home / Self Care | Payer: Medicare HMO | Source: Ambulatory Visit | Attending: Thoracic Surgery (Cardiothoracic Vascular Surgery) | Admitting: Thoracic Surgery (Cardiothoracic Vascular Surgery)

## 2018-01-13 DIAGNOSIS — J449 Chronic obstructive pulmonary disease, unspecified: Secondary | ICD-10-CM | POA: Insufficient documentation

## 2018-01-13 DIAGNOSIS — R911 Solitary pulmonary nodule: Secondary | ICD-10-CM | POA: Insufficient documentation

## 2018-01-13 LAB — PULMONARY FUNCTION TEST
DL/VA % pred: 87 %
DL/VA: 3.64 ml/min/mmHg/L
DLCO UNC: 11.66 ml/min/mmHg
DLCO unc % pred: 64 %
FEF 25-75 POST: 1.03 L/s
FEF 25-75 Pre: 1.3 L/sec
FEF2575-%Change-Post: -21 %
FEF2575-%PRED-PRE: 90 %
FEF2575-%Pred-Post: 71 %
FEV1-%Change-Post: -5 %
FEV1-%PRED-PRE: 91 %
FEV1-%Pred-Post: 86 %
FEV1-POST: 1.48 L
FEV1-PRE: 1.57 L
FEV1FVC-%Change-Post: 0 %
FEV1FVC-%PRED-PRE: 104 %
FEV6-%Change-Post: -5 %
FEV6-%PRED-POST: 86 %
FEV6-%Pred-Pre: 91 %
FEV6-POST: 1.88 L
FEV6-Pre: 2 L
FEV6FVC-%CHANGE-POST: 0 %
FEV6FVC-%PRED-POST: 105 %
FEV6FVC-%Pred-Pre: 106 %
FVC-%Change-Post: -4 %
FVC-%PRED-POST: 82 %
FVC-%Pred-Pre: 86 %
FVC-Post: 1.9 L
FVC-Pre: 2 L
POST FEV6/FVC RATIO: 99 %
PRE FEV6/FVC RATIO: 100 %
Post FEV1/FVC ratio: 78 %
Pre FEV1/FVC ratio: 78 %
RV % pred: 128 %
RV: 2.62 L
TLC % PRED: 102 %
TLC: 4.47 L

## 2018-01-13 MED ORDER — ALBUTEROL SULFATE (2.5 MG/3ML) 0.083% IN NEBU
2.5000 mg | INHALATION_SOLUTION | Freq: Once | RESPIRATORY_TRACT | Status: AC
  Administered 2018-01-13: 2.5 mg via RESPIRATORY_TRACT

## 2018-03-14 ENCOUNTER — Other Ambulatory Visit: Payer: Self-pay

## 2018-03-14 ENCOUNTER — Emergency Department (HOSPITAL_BASED_OUTPATIENT_CLINIC_OR_DEPARTMENT_OTHER)
Admission: EM | Admit: 2018-03-14 | Discharge: 2018-03-14 | Disposition: A | Discharge status: Home / Self Care | Payer: Medicare HMO | Attending: Emergency Medicine | Admitting: Emergency Medicine

## 2018-03-14 ENCOUNTER — Emergency Department (HOSPITAL_BASED_OUTPATIENT_CLINIC_OR_DEPARTMENT_OTHER): Payer: Medicare HMO

## 2018-03-14 ENCOUNTER — Encounter (HOSPITAL_BASED_OUTPATIENT_CLINIC_OR_DEPARTMENT_OTHER): Payer: Self-pay | Admitting: Emergency Medicine

## 2018-03-14 DIAGNOSIS — N1339 Other hydronephrosis: Secondary | ICD-10-CM

## 2018-03-14 DIAGNOSIS — I10 Essential (primary) hypertension: Secondary | ICD-10-CM | POA: Insufficient documentation

## 2018-03-14 DIAGNOSIS — R1032 Left lower quadrant pain: Secondary | ICD-10-CM | POA: Insufficient documentation

## 2018-03-14 DIAGNOSIS — F17210 Nicotine dependence, cigarettes, uncomplicated: Secondary | ICD-10-CM | POA: Diagnosis not present

## 2018-03-14 DIAGNOSIS — R112 Nausea with vomiting, unspecified: Secondary | ICD-10-CM | POA: Diagnosis not present

## 2018-03-14 DIAGNOSIS — Z7902 Long term (current) use of antithrombotics/antiplatelets: Secondary | ICD-10-CM | POA: Diagnosis not present

## 2018-03-14 DIAGNOSIS — Z79899 Other long term (current) drug therapy: Secondary | ICD-10-CM | POA: Insufficient documentation

## 2018-03-14 DIAGNOSIS — Z7982 Long term (current) use of aspirin: Secondary | ICD-10-CM | POA: Diagnosis not present

## 2018-03-14 LAB — CBC WITH DIFFERENTIAL/PLATELET
Basophils Absolute: 0 10*3/uL (ref 0.0–0.1)
Basophils Relative: 0 %
Eosinophils Absolute: 0 10*3/uL (ref 0.0–0.7)
Eosinophils Relative: 0 %
HEMATOCRIT: 41.3 % (ref 36.0–46.0)
Hemoglobin: 14 g/dL (ref 12.0–15.0)
LYMPHS PCT: 10 %
Lymphs Abs: 1.5 10*3/uL (ref 0.7–4.0)
MCH: 31.7 pg (ref 26.0–34.0)
MCHC: 33.9 g/dL (ref 30.0–36.0)
MCV: 93.4 fL (ref 78.0–100.0)
MONO ABS: 0.7 10*3/uL (ref 0.1–1.0)
MONOS PCT: 5 %
NEUTROS ABS: 12.2 10*3/uL — AB (ref 1.7–7.7)
Neutrophils Relative %: 85 %
Platelets: 274 10*3/uL (ref 150–400)
RBC: 4.42 MIL/uL (ref 3.87–5.11)
RDW: 13.8 % (ref 11.5–15.5)
WBC: 14.5 10*3/uL — ABNORMAL HIGH (ref 4.0–10.5)

## 2018-03-14 LAB — COMPREHENSIVE METABOLIC PANEL
ALT: 12 U/L (ref 0–44)
ANION GAP: 13 (ref 5–15)
AST: 23 U/L (ref 15–41)
Albumin: 4.4 g/dL (ref 3.5–5.0)
Alkaline Phosphatase: 72 U/L (ref 38–126)
BILIRUBIN TOTAL: 0.5 mg/dL (ref 0.3–1.2)
BUN: 15 mg/dL (ref 8–23)
CO2: 24 mmol/L (ref 22–32)
Calcium: 9.4 mg/dL (ref 8.9–10.3)
Chloride: 106 mmol/L (ref 98–111)
Creatinine, Ser: 0.9 mg/dL (ref 0.44–1.00)
GFR calc non Af Amer: >60 mL/min (ref >60)
Glucose, Bld: 139 mg/dL — ABNORMAL HIGH (ref 70–99)
POTASSIUM: 4 mmol/L (ref 3.5–5.1)
Sodium: 143 mmol/L (ref 135–145)
TOTAL PROTEIN: 7.4 g/dL (ref 6.5–8.1)

## 2018-03-14 LAB — URINALYSIS, ROUTINE W REFLEX MICROSCOPIC
BILIRUBIN URINE: NEGATIVE
Glucose, UA: NEGATIVE mg/dL
Ketones, ur: 15 mg/dL — AB
Leukocytes, UA: NEGATIVE
Nitrite: NEGATIVE
Protein, ur: NEGATIVE mg/dL
Specific Gravity, Urine: 1.015 (ref 1.005–1.030)
pH: 7 (ref 5.0–8.0)

## 2018-03-14 LAB — URINALYSIS, MICROSCOPIC (REFLEX)

## 2018-03-14 MED ORDER — METOCLOPRAMIDE HCL 5 MG/ML IJ SOLN
10.0000 mg | Freq: Once | INTRAMUSCULAR | Status: DC
Start: 2018-03-14 — End: 2018-03-14
  Filled 2018-03-14: qty 2

## 2018-03-14 MED ORDER — SODIUM CHLORIDE 0.9 % IV BOLUS
1000.0000 mL | Freq: Once | INTRAVENOUS | Status: AC
  Administered 2018-03-14: 1000 mL via INTRAVENOUS

## 2018-03-14 MED ORDER — PROMETHAZINE HCL 12.5 MG PO TABS: 12.5000 mg | ORAL_TABLET | Freq: Four times a day (QID) | ORAL | 0 refills | Status: DC | PRN

## 2018-03-14 MED ORDER — FENTANYL CITRATE (PF) 100 MCG/2ML IJ SOLN
50.0000 ug | Freq: Once | INTRAMUSCULAR | Status: AC
  Administered 2018-03-14: 50 ug via INTRAVENOUS
  Filled 2018-03-14: qty 2

## 2018-03-14 MED ORDER — PROMETHAZINE HCL 25 MG/ML IJ SOLN
12.5000 mg | Freq: Once | INTRAMUSCULAR | Status: AC
  Administered 2018-03-14: 12.5 mg via INTRAVENOUS
  Filled 2018-03-14: qty 1

## 2018-03-14 MED ORDER — IOPAMIDOL (ISOVUE-300) INJECTION 61%: 100.0000 mL | Freq: Once | INTRAVENOUS | Status: DC | PRN

## 2018-03-14 MED ORDER — DIPHENHYDRAMINE HCL 50 MG/ML IJ SOLN
12.5000 mg | Freq: Once | INTRAMUSCULAR | Status: AC
  Administered 2018-03-14: 12.5 mg via INTRAVENOUS
  Filled 2018-03-14: qty 1

## 2018-03-14 MED ORDER — METOCLOPRAMIDE HCL 5 MG/ML IJ SOLN
10.0000 mg | Freq: Once | INTRAMUSCULAR | Status: AC
  Administered 2018-03-14: 10 mg via INTRAVENOUS
  Filled 2018-03-14: qty 2

## 2018-03-14 MED ORDER — DIPHENHYDRAMINE HCL 50 MG/ML IJ SOLN
12.5000 mg | Freq: Once | INTRAMUSCULAR | Status: AC
Start: 2018-03-14 — End: 2018-03-14
  Administered 2018-03-14: 12.5 mg via INTRAVENOUS
  Filled 2018-03-14: qty 1

## 2018-03-14 NOTE — ED Notes (Signed)
ED Provider at bedside. 

## 2018-03-14 NOTE — ED Triage Notes (Signed)
Pt c/o lower abd pain since 0700; vomiting since 0900

## 2018-03-14 NOTE — ED Provider Notes (Signed)
[facility] EMERGENCY DEPARTMENT Provider Note   CSN: [id number] Arrival date & time: 03/14/18  1857     History   Chief Complaint Chief Complaint  Patient presents with  . Emesis  . Abdominal Pain    HPI Leslie Boyd is a 72 y.o. female history of reflux here presenting with left lower quadrant pain, vomiting, loose stools.  She states that she has been having left lower quadrant pain since 7 AM this morning.  He has vomited 4-5 times since this morning.  She states that she tried ice chips and ginger ale but vomited right afterwards.  She also has some loose stools but denies any diarrhea.  She states that she is still passing gas.  She denies any recent travel or eating uncooked food.  Denies any fevers or chills.  Patient states that she had previous C-sections in the past but never had a history of small bowel obstruction or history of diverticulitis.  The history is provided by the patient.    Past Medical History:  Diagnosis Date  . Blood transfusion without reported diagnosis 1963   after tonsillectomy   . GERD (gastroesophageal reflux disease)   . 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 Osteopenia   . Substance abuse ([place])    episodic tobacco dependance    Patient Active Problem List   Diagnosis Date Noted  . Substance abuse ([place])   . 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 Vitamin D deficiency 07/21/2017  . Coronary artery calcification seen on CAT scan 07/15/2017  . Episodic tobacco dependence 09/01/2016  . Essential hypertension 09/01/2016  . Nodule of left lung 09/01/2016  . Hypercholesteremia 07/18/2016  . Indigestion 03/17/2016  . Mixed emotional features as adjustment reaction 03/17/2016  . Osteopenia 03/17/2016    Past Surgical History:  Procedure Laterality Date  . CATARACT EXTRACTION W/ INTRAOCULAR LENS  IMPLANT, BILATERAL  2016  . [place]  . TONSILLECTOMY  1963     OB  History   None      Home Medications    Prior to Admission medications   Medication Sig Start Date End Date Taking? Authorizing Provider  amLODipine (NORVASC) 5 MG tablet Take 5 mg by mouth daily.    [provider]  aspirin EC 81 MG tablet Take 81 mg by mouth daily.    [provider]  calcium-vitamin D (OSCAL WITH D) 500-200 MG-UNIT tablet Take 1 tablet by mouth 2 (two) times daily.    [provider]  cetirizine (ZYRTEC) 10 MG tablet Take by mouth.    [provider]  Omega-3 1000 MG CAPS Take 1 capsule by mouth daily.    [provider]  pravastatin (PRAVACHOL) 20 MG tablet Take 20 mg by mouth daily.    [provider]  ranitidine (ZANTAC) 150 MG tablet Take 150 mg by mouth 2 (two) times daily.    [provider]  varenicline (CHANTIX) 0.5 MG tablet Take 0.5 mg by mouth 2 (two) times daily. Increase as instructed    [provider]    Family History Family History  Problem Relation Age of Onset  . Heart disease Father   . COPD Sister   . Heart disease Brother   . Colon cancer Neg Hx     Social History Social History   Tobacco Use  . Smoking status: Current Every Day Smoker    Types:  Cigarettes  . Smokeless tobacco: Never Used  . Tobacco comment: smokes 4 cigarettes a day  Substance Use Topics  . Alcohol use: Yes    Comment: occ  . Drug use: No     Allergies   Zofran [ondansetron hcl] and Morphine and related   Review of Systems Review of Systems  Gastrointestinal: Positive for abdominal pain and vomiting.  All other systems reviewed and are negative.    Physical Exam Updated Vital Signs BP (!) 166/83 (BP Location: Right Arm)   Pulse 82   Temp 98.8 F (37.1 C) (Oral)   Resp 17   Ht 5\' 1"  (1.549 m)   Wt 44.5 kg   SpO2 98%   BMI 18.52 kg/m   Physical Exam  Constitutional: She is oriented to person, place, and time.  Uncomfortable   HENT:  Head: Normocephalic.  MM slightly  dry   Eyes: Pupils are equal, round, and reactive to light. EOM are normal.  Cardiovascular: Normal rate, regular rhythm and normal heart sounds.  Pulmonary/Chest: Effort normal and breath sounds normal.  Abdominal:  Mild LLQ tenderness, no rebound   Neurological: She is alert and oriented to person, place, and time.  Skin: Skin is warm. Capillary refill takes less than 2 seconds.  Psychiatric: She has a normal mood and affect. Her behavior is normal.  Nursing note and vitals reviewed.    ED Treatments / Results  Labs (all labs ordered are listed, but only abnormal results are displayed) Labs Reviewed  CBC WITH DIFFERENTIAL/PLATELET - Abnormal; Notable for the following components:      Result Value   WBC 14.5 (*)    Neutro Abs 12.2 (*)    All other components within normal limits  COMPREHENSIVE METABOLIC PANEL - Abnormal; Notable for the following components:   Glucose, Bld 139 (*)    All other components within normal limits  URINALYSIS, ROUTINE W REFLEX MICROSCOPIC - Abnormal; Notable for the following components:   Hgb urine dipstick TRACE (*)    Ketones, ur 15 (*)    All other components within normal limits  URINALYSIS, MICROSCOPIC (REFLEX) - Abnormal; Notable for the following components:   Bacteria, UA MANY (*)    All other components within normal limits    EKG None  Radiology Ct Abdomen Pelvis Wo Contrast  Result Date: 03/14/2018 CLINICAL DATA:  72 year old female with acute abdominal and pelvic pain and vomiting today. EXAM: CT ABDOMEN AND PELVIS WITHOUT CONTRAST TECHNIQUE: Multidetector CT imaging of the abdomen and pelvis was performed following the standard protocol without IV contrast. COMPARISON:  10/29/2017 chest CT, 03/23/2017 PET CT and 08/24/2013 abdominal/pelvic CT FINDINGS: Please note that parenchymal abnormalities may be missed without intravenous contrast. Lower chest: No acute abnormality. Hepatobiliary: The liver and gallbladder are unremarkable. No  biliary dilatation. Pancreas: Unremarkable Spleen: Unremarkable Adrenals/Urinary Tract: Moderate LEFT hydroureteronephrosis to the bladder is noted with equivocal subtle soft tissue at the UVJ. 1 cm area of slightly higher density material within the lower LEFT renal collecting system is indeterminate (series 4: Image 26 and series 6: Image 39). A smaller area of higher density material within the LEFT renal collecting system (4:20) is identified. At least 3 slightly hyperdense LEFT renal lesions appear unchanged from 03/23/2017 PET-CT and were not hypermetabolic. The RIGHT kidney is unremarkable. No adrenal abnormalities are identified. Stomach/Bowel: Stomach is within normal limits except for small hiatal hernia. Appendix appears normal. No evidence of bowel wall thickening, distention, or inflammatory changes. Vascular/Lymphatic: Aortic atherosclerosis. No enlarged  abdominal or pelvic lymph nodes. Reproductive: Uterus and bilateral adnexa are unremarkable. Other: No ascites, focal collection or pneumoperitoneum. Musculoskeletal: No acute or suspicious bony abnormalities. IMPRESSION: 1. Moderate LEFT hydroureteronephrosis to the bladder with equivocal subtle soft tissue at the UVJ and areas of slightly higher density material within the LEFT renal collecting system. Synchronous uroepithelial malignancy not excluded and urology consultation is recommended. 2. Unchanged LEFT renal lesions, probably representing slightly hyperdense renal cysts given prior studies. 3.  Aortic Atherosclerosis (ICD10-I70.0). Electronically Signed   By: Margarette Canada M.D.   On: 03/14/2018 21:28    Procedures Procedures (including critical care time)  Medications Ordered in ED Medications  iopamidol (ISOVUE-300) 61 % injection 100 mL (has no administration in time range)  sodium chloride 0.9 % bolus 1,000 mL (1,000 mLs Intravenous New Bag/Given 03/14/18 2243)  sodium chloride 0.9 % bolus 1,000 mL (0 mLs Intravenous Stopped 03/14/18  2223)  diphenhydrAMINE (BENADRYL) injection 12.5 mg (12.5 mg Intravenous Given 03/14/18 1959)  promethazine (PHENERGAN) injection 12.5 mg (12.5 mg Intravenous Given 03/14/18 1958)  promethazine (PHENERGAN) injection 12.5 mg (12.5 mg Intravenous Given 03/14/18 2113)  diphenhydrAMINE (BENADRYL) injection 12.5 mg (12.5 mg Intravenous Given 03/14/18 2113)  metoCLOPramide (REGLAN) injection 10 mg (10 mg Intravenous Given 03/14/18 2243)  fentaNYL (SUBLIMAZE) injection 50 mcg (50 mcg Intravenous Given 03/14/18 2243)     Initial Impression / Assessment and Plan / ED Course  I have reviewed the triage vital signs and the nursing notes.  Pertinent labs & imaging results that were available during my care of the patient were reviewed by me and considered in my medical decision making (see chart for details).     Brindle Leyba is a 72 y.o. female here with LLQ pain, vomiting. Likely gastroenteritis vs diverticulitis. Will get labs, UA, CT ab/pel. Will hydrate and give antiemetics and reassess.   10:30 pm CT showed L hydro from possible bladder mass. Labs showed WBC 14 but UA showed no UTI. Cr normal. Still nauseated after phenergan. Will give reglan and PO trial.   11:29 PM Given reglan and felt better. Will dc home with nausea meds. Will have her call urology for follow up.   Final Clinical Impressions(s) / ED Diagnoses   Final diagnoses:  None    ED Discharge Orders    None       Drenda Freeze, MD 03/14/18 2330

## 2018-03-14 NOTE — ED Notes (Signed)
Pt reports continued nausea and pain.

## 2018-03-14 NOTE — Discharge Instructions (Addendum)
Take phenergan as needed for nausea.   Stay hydrated.   You have a mass around your bladder causing swelling of your kidney. You need to call urology tomorrow for appointment   Return to ER if you have worse vomiting, abdominal pain, dehydration, fever, trouble urinating

## 2018-03-14 NOTE — ED Notes (Signed)
Pt failed PO challenge, continuing to vomit and dry heave. ED provider notified, see new orders.

## 2018-03-14 NOTE — ED Notes (Signed)
Pt refused Oral contrast;   CT waiting on labs before performing exam;   pt stated that she wants to wait for CT scan until her medication takes effect.

## 2018-03-15 DIAGNOSIS — I700 Atherosclerosis of aorta: Secondary | ICD-10-CM | POA: Insufficient documentation

## 2018-04-02 DIAGNOSIS — N289 Disorder of kidney and ureter, unspecified: Secondary | ICD-10-CM | POA: Insufficient documentation

## 2018-04-12 ENCOUNTER — Other Ambulatory Visit: Payer: Self-pay

## 2018-04-12 ENCOUNTER — Other Ambulatory Visit: Payer: Self-pay | Admitting: *Deleted

## 2018-04-12 ENCOUNTER — Encounter: Payer: Self-pay | Admitting: *Deleted

## 2018-04-12 ENCOUNTER — Encounter: Payer: Self-pay | Admitting: Thoracic Surgery (Cardiothoracic Vascular Surgery)

## 2018-04-12 ENCOUNTER — Ambulatory Visit (INDEPENDENT_AMBULATORY_CARE_PROVIDER_SITE_OTHER): Payer: Medicare HMO | Admitting: Thoracic Surgery (Cardiothoracic Vascular Surgery)

## 2018-04-12 VITALS — BP 158/58 | HR 87 | Resp 18 | Ht 61.0 in | Wt 96.6 lb

## 2018-04-12 DIAGNOSIS — R911 Solitary pulmonary nodule: Secondary | ICD-10-CM | POA: Diagnosis not present

## 2018-04-12 NOTE — H&P (View-Only) (Signed)
[address]       ,[place] [address]             [phone]      HPI: Leslie Boyd returns regarding her left upper lobe lung nodule  Leslie Boyd is a 72 year old woman with a history of tobacco abuse (50 pack years), hypertension, hyperlipidemia, anxiety, reflux, hyperlipidemia, and coronary calcification on CT.  She started smoking at age 64.  She is still smoking 3 or 4 cigarettes a day.  She had a lung cancer screening study done in January 2018.  She had a 13 mm mixed density nodule in the posterior left upper lobe.  There was a 5 mm solid component.  There is no mediastinal or hilar adenopathy.  Short-term CT 6 months later showed no change.  She had a PET CT in September 2018 which showed low-level metabolic activity with an SUV of 1.0.  She refused surgical referral at that time.  A CT in May 2019 showed a slight increase in size from her initial scan.  She was referred for surgical evaluation.  I saw her in June.  I recommended we do a left VATS for wedge resection and possible lingular sparing left upper lobectomy.  She said that Leslie Boyd who told her she needed cardiology clearance prior to any surgery and she went to see him in July.  He did a stress echo which apparently showed some EKG changes but no echocardiographic changes.  She was told she was okay to proceed with surgery.  In the meantime she developed left lower quadrant pain with nausea and vomiting.  She went to the emergency room.  She was found to have a left hydronephrosis.  The story becomes quite confusing thereafter.  She says she was told she did not have cancer and then told that she might have it.  She was scheduled for a stent but decided to seek a second opinion.  She has an appointment with [facility] on 04/19/2018.  Zubrod Score: At the time of surgery this patient's most appropriate activity status/level should be described as: [x]     0    Normal activity, no symptoms []     1     Restricted in physical strenuous activity but ambulatory, able to do out light work []     2    Ambulatory and capable of self care, unable to do work activities, up and about >50 % of waking hours                              []     3    Only limited self care, in bed greater than 50% of waking hours []     4    Completely disabled, no self care, confined to bed or chair []     5    Moribund  Past Medical History:  Diagnosis Date  . Blood transfusion without reported diagnosis 1963   after tonsillectomy   . GERD (gastroesophageal reflux disease)   . 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 Osteopenia   . Substance abuse ([place])    episodic tobacco dependance   Past Surgical History:  Procedure Laterality Date  . CATARACT EXTRACTION W/ INTRAOCULAR LENS  IMPLANT, BILATERAL  2016  . [place]  . TONSILLECTOMY  1963    Current Outpatient Medications  Medication Sig  Dispense Refill  . amLODipine (NORVASC) 5 MG tablet Take 5 mg by mouth daily.    . pravastatin (PRAVACHOL) 20 MG tablet Take 20 mg by mouth daily.    . promethazine (PHENERGAN) 12.5 MG tablet Take 1 tablet (12.5 mg total) by mouth every 6 (six) hours as needed for nausea or vomiting. 10 tablet 0  . ranitidine (ZANTAC) 150 MG tablet Take 150 mg by mouth 2 (two) times daily.    . varenicline (CHANTIX) 0.5 MG tablet Take 0.5 mg by mouth 2 (two) times daily. Increase as instructed     No current facility-administered medications for this visit.    Social History   Socioeconomic History  . Marital status: Married    Spouse name: Not on file  . Number of children: Not on file  . Years of education: Not on file  . Highest education level: Not on file  Occupational History  . Not on file  Social Needs  . Financial resource strain: Not on file  . Food insecurity:    Worry: Not on file    Inability: Not on file  . Transportation needs:    Medical: Not on file    Non-medical: Not on  file  Tobacco Use  . Smoking status: Current Every Day Smoker    Types: Cigarettes  . Smokeless tobacco: Never Used  . Tobacco comment: smokes 4 cigarettes a day  Substance and Sexual Activity  . Alcohol use: Yes    Comment: occ  . Drug use: No  . Sexual activity: Not on file  Lifestyle  . Physical activity:    Days per week: Not on file    Minutes per session: Not on file  . Stress: Not on file  Relationships  . Social connections:    Talks on phone: Not on file    Gets together: Not on file    Attends religious service: Not on file    Active member of club or organization: Not on file    Attends meetings of clubs or organizations: Not on file    Relationship status: Not on file  . Intimate partner violence:    Fear of current or ex partner: Not on file    Emotionally abused: Not on file    Physically abused: Not on file    Forced sexual activity: Not on file  Other Topics Concern  . Not on file  Social History Narrative  . Not on file    Physical Exam BP (!) 158/58 (BP Location: Right Arm, Patient Position: Sitting, Cuff Size: Normal)   Pulse 87   Resp 18   Ht 5\' 1"  (1.549 m)   Wt 96 lb 9.6 oz (43.8 kg)   SpO2 98% Comment: RA  BMI 18.54 kg/m  72 year old woman in no acute distress Alert and oriented x3 with no focal deficits No cervical or supraclavicular adenopathy Lungs clear with equal breath sounds bilaterally Cardiac regular rate and rhythm normal S1 and S2 Abdomen soft nontender No clubbing cyanosis or edema  Diagnostic Tests: CT CHEST WITHOUT CONTRAST  TECHNIQUE: Multidetector CT imaging of the chest was performed following the standard protocol without IV contrast.  COMPARISON: Chest CTs, 10/29/2017, 02/03/2017 and 08/04/2016. PET-CT, 03/23/2017.  FINDINGS: Cardiovascular: Heart normal in size. No pericardial effusion. Dense left coronary artery calcifications. Great vessels normal in caliber. Mild aortic  atherosclerosis.  Mediastinum/Nodes: No enlarged mediastinal or axillary lymph nodes. Thyroid gland, trachea, and esophagus demonstrate no significant findings.  Lungs/Pleura: Sub solid nodule, left  upper lobe, centered on image 48, series 3, with mild associated anterior retraction of the oblique fissure. Nodule currently measures 14 x 11 mm in greatest transverse dimension. On the coronal re-formatted image, measures 12 mm in greatest right to left dimension and on the sagittal re-formatted image measures 19 mm in greatest superior to inferior dimension. Nodule has slightly increased in size when compared to the 08/04/2016 exam were measured 13 x 10 mm transversely.  No other nodules. Mild stable scarring in the lung bases, associated in the posteromedial right lower lobe with mild bronchiectasis. Stable apical scarring with associated calcifications.  No acute findings. No evidence of pneumonia or edema. No pleural effusion or pneumothorax.  Upper Abdomen: No acute abnormality.  Musculoskeletal: Old sternal fracture. No acute fracture. No osteoblastic or osteolytic lesions.  IMPRESSION: 1. Sub solid left upper lobe pulmonary nodule, slightly increased in size when compared to the study dated 08/04/2016. This remains suspicious for low-grade adenocarcinoma.  2. No new nodules. No acute findings in the chest.  Aortic Atherosclerosis (ICD10-I70.0).   Electronically Signed  By: Lajean Manes M.D.  On: 04/05/2018 11:21 I personally reviewed the CT images and concur with the findings noted above  Pulmonary function testing FVC 2.00 (86%) FEV1 1.57 (91%) DLCO 11.66 (64%)   Impression: Leslie Boyd is a 72 year old woman with a history of tobacco abuse who has a 14 mm mixed density left upper lobe nodule on CT scan.  This was initially found about a year ago.  There was low-level metabolic activity.  There has been a slight interval growth on serial CT scans.  I saw her  back in June and recommended surgery at that time.  She went for cardiology clearance and then disappeared for several weeks.  She had a repeat CT on 04/05/2018 which showed slight interval growth of the nodule but no other significant changes and still no evidence of regional or distant metastases.  I discussed with Mrs. Wanner the importance of dealing with his lung nodule at this point rather than waiting for it to potentially grow or spread.  I recommended that we proceed with left VATS for wedge resection and possible lingular sparing left upper lobectomy.  Is unlikely to be necessary but she does have adequate lung function to tolerate a lobectomy if needed.  I discussed the general nature of the procedure with her again.  We discussed the need for general anesthesia, the incision to be used, the use of a drainage tube postoperatively, the expected hospital stay, and the overall recovery.  We again reviewed the indications, risks, benefits, alternatives.  She understands the risks include, but not limited to death, MI, DVT, PE, bleeding, possible need for transfusion, infection, prolonged air leak, cardiac arrhythmias, as well as the possibility of other unforeseeable complications.  Hydronephrosis - this lung nodule has been relatively slow-growing and this is not an emergent procedure.  Hopefully the urologic issue can be dealt with prior to surgery.  [facility] has [facility] appointment [facility] for a second opinion.  We will schedule her surgery for 05/03/2018 in hopes that any needed urology procedure can be done prior to that.  Coronary calcification - we will need definitive confirmation from Leslie Boyd that she is okay to proceed with surgery.  Plan: Keep appointment with [facility] on 04/19/2018.  Tentatively plan for left VATS for wedge resection and possible lingular sparing left upper lobectomy on Monday, 05/03/2018.  [name], MD [facility] [phone]

## 2018-04-12 NOTE — Progress Notes (Signed)
[address]       [place] [address]             [phone]      HPI: Mrs. Demuro returns regarding her left upper lobe lung nodule  Leslie Boyd is a 72 year old woman with a history of tobacco abuse (50 pack years), hypertension, hyperlipidemia, anxiety, reflux, hyperlipidemia, and coronary calcification on CT.  She started smoking at age 19.  She is still smoking 3 or 4 cigarettes a day.  She had a lung cancer screening study done in January 2018.  She had a 13 mm mixed density nodule in the posterior left upper lobe.  There was a 5 mm solid component.  There is no mediastinal or hilar adenopathy.  Short-term CT 6 months later showed no change.  She had a PET CT in September 2018 which showed low-level metabolic activity with an SUV of 1.0.  She refused surgical referral at that time.  A CT in May 2019 showed a slight increase in size from her initial scan.  She was referred for surgical evaluation.  I saw her in June.  I recommended we do a left VATS for wedge resection and possible lingular sparing left upper lobectomy.  She said that Dr. Atilano Median who told her she needed cardiology clearance prior to any surgery and she went to see him in July.  He did a stress echo which apparently showed some EKG changes but no echocardiographic changes.  She was told she was okay to proceed with surgery.  In the meantime she developed left lower quadrant pain with nausea and vomiting.  She went to the emergency room.  She was found to have a left hydronephrosis.  The story becomes quite confusing thereafter.  She says she was told she did not have cancer and then told that she might have it.  She was scheduled for a stent but decided to seek a second opinion.  She has an appointment with [facility] on 04/19/2018.  Zubrod Score: At the time of surgery this patient's most appropriate activity status/level should be described as: [x]     0    Normal activity, no symptoms []     1     Restricted in physical strenuous activity but ambulatory, able to do out light work []     2    Ambulatory and capable of self care, unable to do work activities, up and about >50 % of waking hours                              []     3    Only limited self care, in bed greater than 50% of waking hours []     4    Completely disabled, no self care, confined to bed or chair []     5    Moribund  Past Medical History:  Diagnosis Date  . Blood transfusion without reported diagnosis 1963   after tonsillectomy   . GERD (gastroesophageal reflux disease)   . 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 Osteopenia   . Substance abuse ([place])    episodic tobacco dependance   Past Surgical History:  Procedure Laterality Date  . CATARACT EXTRACTION W/ INTRAOCULAR LENS  IMPLANT, BILATERAL  2016  . [place]  . TONSILLECTOMY  1963    Current Outpatient Medications  Medication Sig  Dispense Refill  . amLODipine (NORVASC) 5 MG tablet Take 5 mg by mouth daily.    . pravastatin (PRAVACHOL) 20 MG tablet Take 20 mg by mouth daily.    . promethazine (PHENERGAN) 12.5 MG tablet Take 1 tablet (12.5 mg total) by mouth every 6 (six) hours as needed for nausea or vomiting. 10 tablet 0  . ranitidine (ZANTAC) 150 MG tablet Take 150 mg by mouth 2 (two) times daily.    . varenicline (CHANTIX) 0.5 MG tablet Take 0.5 mg by mouth 2 (two) times daily. Increase as instructed     No current facility-administered medications for this visit.    Social History   Socioeconomic History  . Marital status: Married    Spouse name: Not on file  . Number of children: Not on file  . Years of education: Not on file  . Highest education level: Not on file  Occupational History  . Not on file  Social Needs  . Financial resource strain: Not on file  . Food insecurity:    Worry: Not on file    Inability: Not on file  . Transportation needs:    Medical: Not on file    Non-medical: Not on  file  Tobacco Use  . Smoking status: Current Every Day Smoker    Types: Cigarettes  . Smokeless tobacco: Never Used  . Tobacco comment: smokes 4 cigarettes a day  Substance and Sexual Activity  . Alcohol use: Yes    Comment: occ  . Drug use: No  . Sexual activity: Not on file  Lifestyle  . Physical activity:    Days per week: Not on file    Minutes per session: Not on file  . Stress: Not on file  Relationships  . Social connections:    Talks on phone: Not on file    Gets together: Not on file    Attends religious service: Not on file    Active member of club or organization: Not on file    Attends meetings of clubs or organizations: Not on file    Relationship status: Not on file  . Intimate partner violence:    Fear of current or ex partner: Not on file    Emotionally abused: Not on file    Physically abused: Not on file    Forced sexual activity: Not on file  Other Topics Concern  . Not on file  Social History Narrative  . Not on file    Physical Exam BP (!) 158/58 (BP Location: Right Arm, Patient Position: Sitting, Cuff Size: Normal)   Pulse 87   Resp 18   Ht 5\' 1"  (1.549 m)   Wt 96 lb 9.6 oz (43.8 kg)   SpO2 98% Comment: RA  BMI 18.74 kg/m  72 year old woman in no acute distress Alert and oriented x3 with no focal deficits No cervical or supraclavicular adenopathy Lungs clear with equal breath sounds bilaterally Cardiac regular rate and rhythm normal S1 and S2 Abdomen soft nontender No clubbing cyanosis or edema  Diagnostic Tests: CT CHEST WITHOUT CONTRAST  TECHNIQUE: Multidetector CT imaging of the chest was performed following the standard protocol without IV contrast.  COMPARISON: Chest CTs, 10/29/2017, 02/03/2017 and 08/04/2016. PET-CT, 03/23/2017.  FINDINGS: Cardiovascular: Heart normal in size. No pericardial effusion. Dense left coronary artery calcifications. Great vessels normal in caliber. Mild aortic  atherosclerosis.  Mediastinum/Nodes: No enlarged mediastinal or axillary lymph nodes. Thyroid gland, trachea, and esophagus demonstrate no significant findings.  Lungs/Pleura: Sub solid nodule, left  upper lobe, centered on image 48, series 3, with mild associated anterior retraction of the oblique fissure. Nodule currently measures 14 x 11 mm in greatest transverse dimension. On the coronal re-formatted image, measures 12 mm in greatest right to left dimension and on the sagittal re-formatted image measures 19 mm in greatest superior to inferior dimension. Nodule has slightly increased in size when compared to the 08/04/2016 exam were measured 13 x 10 mm transversely.  No other nodules. Mild stable scarring in the lung bases, associated in the posteromedial right lower lobe with mild bronchiectasis. Stable apical scarring with associated calcifications.  No acute findings. No evidence of pneumonia or edema. No pleural effusion or pneumothorax.  Upper Abdomen: No acute abnormality.  Musculoskeletal: Old sternal fracture. No acute fracture. No osteoblastic or osteolytic lesions.  IMPRESSION: 1. Sub solid left upper lobe pulmonary nodule, slightly increased in size when compared to the study dated 08/04/2016. This remains suspicious for low-grade adenocarcinoma.  2. No new nodules. No acute findings in the chest.  Aortic Atherosclerosis (ICD10-I70.0).   Electronically Signed  By: Lajean Manes M.D.  On: 04/05/2018 11:21 I personally reviewed the CT images and concur with the findings noted above  Pulmonary function testing FVC 2.00 (86%) FEV1 1.57 (91%) DLCO 11.66 (64%)   Impression: Leslie Boyd is a 72 year old woman with a history of tobacco abuse who has a 14 mm mixed density left upper lobe nodule on CT scan.  This was initially found about a year ago.  There was low-level metabolic activity.  There has been a slight interval growth on serial CT scans.  I saw her  back in June and recommended surgery at that time.  She went for cardiology clearance and then disappeared for several weeks.  She had a repeat CT on 04/05/2018 which showed slight interval growth of the nodule but no other significant changes and still no evidence of regional or distant metastases.  I discussed with Mrs. Ryant the importance of dealing with his lung nodule at this point rather than waiting for it to potentially grow or spread.  I recommended that we proceed with left VATS for wedge resection and possible lingular sparing left upper lobectomy.  Is unlikely to be necessary but she does have adequate lung function to tolerate a lobectomy if needed.  I discussed the general nature of the procedure with her again.  We discussed the need for general anesthesia, the incision to be used, the use of a drainage tube postoperatively, the expected hospital stay, and the overall recovery.  We again reviewed the indications, risks, benefits, alternatives.  She understands the risks include, but not limited to death, MI, DVT, PE, bleeding, possible need for transfusion, infection, prolonged air leak, cardiac arrhythmias, as well as the possibility of other unforeseeable complications.  Hydronephrosis - this lung nodule has been relatively slow-growing and this is not an emergent procedure.  Hopefully the urologic issue can be dealt with prior to surgery.  She has an appointment with [facility] next week for a second opinion.  We will schedule her surgery for 05/03/2018 in hopes that any needed urology procedure can be done prior to that.  Coronary calcification - we will need definitive confirmation from Dr. Atilano Median that she is okay to proceed with surgery.  Plan: Keep appointment with [facility] on 04/19/2018.  Tentatively plan for left VATS for wedge resection and possible lingular sparing left upper lobectomy on Monday, 05/03/2018.  [name], MD [facility] [phone]

## 2018-04-20 ENCOUNTER — Other Ambulatory Visit: Payer: Self-pay | Admitting: Urology

## 2018-04-22 ENCOUNTER — Encounter (HOSPITAL_BASED_OUTPATIENT_CLINIC_OR_DEPARTMENT_OTHER): Payer: Self-pay | Admitting: *Deleted

## 2018-04-26 ENCOUNTER — Other Ambulatory Visit: Payer: Self-pay

## 2018-04-26 ENCOUNTER — Encounter (HOSPITAL_BASED_OUTPATIENT_CLINIC_OR_DEPARTMENT_OTHER): Payer: Self-pay | Admitting: *Deleted

## 2018-04-26 NOTE — Progress Notes (Signed)
Spoke with patient via telephone for pre op interview. NPO after MN. Patient to take zantac with a sip of water AM of surgery. Patient to arrive at 0800.

## 2018-04-26 NOTE — Progress Notes (Signed)
Spoke with Se'Lita at Dr. Gilford Rile office, she had spoken with Caren Griffins at Dr. Luisa Dago office and stated the patient was low risk for surgery. Office to fax over cardiac clearance for patient, will place in chart.

## 2018-04-26 NOTE — H&P (Signed)
Urology Preoperative H&P   Chief Complaint: Left renal pelvis lesion  History of Present Illness: Leslie Boyd is a 72 y.o. female with a left renal pelvis lesion seen on recent CT from 04/02/2018. She also has a left pulmonary nodule that has features suspicious for urothelial carcinoma. She is a longtime smoker (~1ppd since she was 74).   The scans were initially ordered for a one month history of left lower quadrant pain associated with nausea and vomiting. Her sxs have since resolved, which she attributes to changes in her diet. She does report 1 year history of progressively worsening urgency and frequency. UA clear today.   She denies a prior history of kidney stones or personal/family history of GU malignancies. No general history of cancer. She does have a left lung nodule that is followed by Dr. Roxan Hockey and is planning on having it resected at some point in the near future.   CT abdomen/pelvis without and with contrast, 04/02/2018  1. No residual left-sided hydronephrosis demonstrated.  2. There is a persistent hyperdense filling defect within the left lower pole renal calyces, which is moderately suspicious for transitional cell carcinoma. Correlation with urine cytology and retrograde evaluation of the left ureter recommended.  3. No bladder lesion, urinary tract calculus or suspicious renal mass. There are subtle hyperdense left renal cyst.  4. No evidence of metastatic disease  5. Aortic atherosclerosis   CT chest without contrast 04/05/2018  1. Sub solid left upper lobe pulmonary nodule, slightly increased in size when compared to the study dated 08/04/2017. This remains suspicious for low-grade adenocarcinoma.  2. No new nodules. No acute findings in the chest.       Past Medical History:  Diagnosis Date  . Coronary artery calcification seen on CT scan    cardiologist-- dr Atilano Median [facility] in [facility])  . GERD (gastroesophageal reflux disease)   . History of stress test     stress echo 01-29-2018 with dr Atilano Median, cardiologist with [facility] in [facility]  . Hypertension   . 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 Osteopenia   . Renal lesion    left renal pelvis    Past Surgical History:  Procedure Laterality Date  . CATARACT EXTRACTION W/ INTRAOCULAR LENS  IMPLANT, BILATERAL  2016  . [place]  . SKIN GRAFT    . TONSILLECTOMY  1963    Allergies:  Allergies  Allergen Reactions  . Zofran [Ondansetron Hcl] Other (See Comments)    Excessive sweating  . Morphine And Related Nausea And Vomiting and Rash    Family History  Problem Relation Age of Onset  . Heart disease Father   . COPD Sister   . Heart disease Brother   . Colon cancer Neg Hx     Social History:  reports that she has been smoking cigarettes. She has never used smokeless tobacco. She reports that she drinks alcohol. She reports that she does not use drugs.  ROS: A complete review of systems was performed.  All systems are negative except for pertinent findings as noted.  Physical Exam:  Vital signs in last 24 hours: Weight:  [43.5 kg] 43.5 kg (10/21 1420) Constitutional:  Alert and oriented, No acute distress Cardiovascular: Regular rate and rhythm, No JVD Respiratory: Normal respiratory effort, Lungs clear bilaterally GI: Abdomen is soft, nontender, nondistended, no abdominal masses GU: No CVA tenderness Lymphatic:  No lymphadenopathy Neurologic: Grossly intact, no focal deficits Psychiatric: Normal mood and affect  Laboratory Data:  No results for input(s): WBC, HGB, HCT, PLT in the last 72 hours.  No results for input(s): NA, K, CL, GLUCOSE, BUN, CALCIUM, CREATININE in the last 72 hours.  Invalid input(s): CO3   No results found for this or any previous visit (from the past 24 hour(s)). No results found for this or any previous visit (from the past 240 hour(s)).  Renal  Function: No results for input(s): CREATININE in the last 168 hours. CrCl cannot be calculated (Patient's most recent lab result is older than the maximum 21 days allowed.).  Radiologic Imaging: No results found.  I independently reviewed the above imaging studies.  Assessment and Plan Leslie Boyd is a 72 y.o. female with a left renal pelvis lesion   The risks, benefits and alternatives of cystoscopy with LEFT ureteroscopy, bilateral RPGs and left ureteral stent placement was discussed the patient. Risks included, but are not limited to: bleeding, urinary tract infection, ureteral injury/avulsion, ureteral stricture formation, MI, stroke, PE and the inherent risks of general anesthesia. The patient voices understanding and wishes to proceed.    Ellison Hughs, MD 04/26/2018, 4:59 PM  [facility] Pager: [phone]

## 2018-04-27 ENCOUNTER — Ambulatory Visit (HOSPITAL_BASED_OUTPATIENT_CLINIC_OR_DEPARTMENT_OTHER)
Admission: RE | Admit: 2018-04-27 | Discharge: 2018-04-27 | Disposition: A | Discharge status: Home / Self Care | Payer: Medicare HMO | Source: Ambulatory Visit | Attending: Urology | Admitting: Urology

## 2018-04-27 ENCOUNTER — Encounter (HOSPITAL_BASED_OUTPATIENT_CLINIC_OR_DEPARTMENT_OTHER): Payer: Self-pay | Admitting: Anesthesiology

## 2018-04-27 ENCOUNTER — Ambulatory Visit (HOSPITAL_BASED_OUTPATIENT_CLINIC_OR_DEPARTMENT_OTHER): Payer: Medicare HMO | Admitting: Anesthesiology

## 2018-04-27 ENCOUNTER — Encounter (HOSPITAL_BASED_OUTPATIENT_CLINIC_OR_DEPARTMENT_OTHER)
Admission: RE | Disposition: A | Discharge status: Home / Self Care | Payer: Self-pay | Source: Ambulatory Visit | Attending: Urology

## 2018-04-27 DIAGNOSIS — M8580 Other specified disorders of bone density and structure, unspecified site: Secondary | ICD-10-CM | POA: Diagnosis not present

## 2018-04-27 DIAGNOSIS — Z885 Allergy status to narcotic agent status: Secondary | ICD-10-CM | POA: Diagnosis not present

## 2018-04-27 DIAGNOSIS — R911 Solitary pulmonary nodule: Secondary | ICD-10-CM | POA: Diagnosis not present

## 2018-04-27 DIAGNOSIS — Z888 Allergy status to other drugs, medicaments and biological substances status: Secondary | ICD-10-CM | POA: Insufficient documentation

## 2018-04-27 DIAGNOSIS — N210 Calculus in bladder: Secondary | ICD-10-CM | POA: Insufficient documentation

## 2018-04-27 DIAGNOSIS — Z8249 Family history of ischemic heart disease and other diseases of the circulatory system: Secondary | ICD-10-CM | POA: Insufficient documentation

## 2018-04-27 DIAGNOSIS — F17210 Nicotine dependence, cigarettes, uncomplicated: Secondary | ICD-10-CM | POA: Diagnosis not present

## 2018-04-27 DIAGNOSIS — I2510 Atherosclerotic heart disease of native coronary artery without angina pectoris: Secondary | ICD-10-CM | POA: Diagnosis not present

## 2018-04-27 DIAGNOSIS — I700 Atherosclerosis of aorta: Secondary | ICD-10-CM | POA: Diagnosis not present

## 2018-04-27 DIAGNOSIS — I10 Essential (primary) hypertension: Secondary | ICD-10-CM | POA: Diagnosis not present

## 2018-04-27 DIAGNOSIS — N132 Hydronephrosis with renal and ureteral calculous obstruction: Secondary | ICD-10-CM | POA: Insufficient documentation

## 2018-04-27 DIAGNOSIS — K219 Gastro-esophageal reflux disease without esophagitis: Secondary | ICD-10-CM | POA: Diagnosis not present

## 2018-04-27 DIAGNOSIS — N1330 Unspecified hydronephrosis: Secondary | ICD-10-CM | POA: Diagnosis present

## 2018-04-27 HISTORY — DX: Personal history of other medical treatment: Z92.89

## 2018-04-27 HISTORY — DX: Essential (primary) hypertension: I10

## 2018-04-27 HISTORY — DX: Disorder of kidney and ureter, unspecified: N28.9

## 2018-04-27 HISTORY — PX: CYSTOSCOPY WITH RETROGRADE PYELOGRAM, URETEROSCOPY AND STENT PLACEMENT: SHX5789

## 2018-04-27 HISTORY — DX: Atherosclerotic heart disease of native coronary artery without angina pectoris: I25.10

## 2018-04-27 SURGERY — CYSTOURETEROSCOPY, WITH RETROGRADE PYELOGRAM AND STENT INSERTION
Anesthesia: General | Site: Ureter | Laterality: Left

## 2018-04-27 MED ORDER — DEXAMETHASONE SODIUM PHOSPHATE 4 MG/ML IJ SOLN
INTRAMUSCULAR | Status: DC | PRN
  Administered 2018-04-27: 10 mg via INTRAVENOUS

## 2018-04-27 MED ORDER — ONDANSETRON HCL 4 MG/2ML IJ SOLN
INTRAMUSCULAR | Status: AC
  Filled 2018-04-27: qty 2

## 2018-04-27 MED ORDER — OXYBUTYNIN CHLORIDE 5 MG PO TABS: 5.0000 mg | ORAL_TABLET | Freq: Three times a day (TID) | ORAL | 1 refills | Status: DC | PRN

## 2018-04-27 MED ORDER — OXYBUTYNIN CHLORIDE 5 MG PO TABS
5.0000 mg | ORAL_TABLET | Freq: Once | ORAL | Status: AC
  Administered 2018-04-27: 5 mg via ORAL
  Filled 2018-04-27: qty 1

## 2018-04-27 MED ORDER — PHENAZOPYRIDINE HCL 200 MG PO TABS: 200.0000 mg | ORAL_TABLET | Freq: Three times a day (TID) | ORAL | 0 refills | Status: DC | PRN

## 2018-04-27 MED ORDER — SUCCINYLCHOLINE CHLORIDE 200 MG/10ML IV SOSY
PREFILLED_SYRINGE | INTRAVENOUS | Status: AC
  Filled 2018-04-27: qty 10

## 2018-04-27 MED ORDER — EPHEDRINE SULFATE-NACL 50-0.9 MG/10ML-% IV SOSY
PREFILLED_SYRINGE | INTRAVENOUS | Status: DC | PRN
  Administered 2018-04-27: 10 mg via INTRAVENOUS
  Administered 2018-04-27: 5 mg via INTRAVENOUS
  Administered 2018-04-27: 10 mg via INTRAVENOUS

## 2018-04-27 MED ORDER — LACTATED RINGERS IV SOLN
INTRAVENOUS | Status: DC
  Administered 2018-04-27 (×2): via INTRAVENOUS
  Filled 2018-04-27: qty 1000

## 2018-04-27 MED ORDER — CEFAZOLIN SODIUM-DEXTROSE 2-4 GM/100ML-% IV SOLN
2.0000 g | INTRAVENOUS | Status: AC
  Administered 2018-04-27: 2 g via INTRAVENOUS
  Filled 2018-04-27: qty 100

## 2018-04-27 MED ORDER — OXYCODONE HCL 5 MG PO TABS
5.0000 mg | ORAL_TABLET | Freq: Once | ORAL | Status: DC | PRN
  Filled 2018-04-27: qty 1

## 2018-04-27 MED ORDER — HYDROMORPHONE HCL 1 MG/ML IJ SOLN
0.2500 mg | INTRAMUSCULAR | Status: DC | PRN
  Filled 2018-04-27: qty 0.5

## 2018-04-27 MED ORDER — IOHEXOL 300 MG/ML  SOLN
INTRAMUSCULAR | Status: DC | PRN
  Administered 2018-04-27: 15 mL

## 2018-04-27 MED ORDER — PHENYLEPHRINE 40 MCG/ML (10ML) SYRINGE FOR IV PUSH (FOR BLOOD PRESSURE SUPPORT)
PREFILLED_SYRINGE | INTRAVENOUS | Status: DC | PRN
  Administered 2018-04-27: 40 ug via INTRAVENOUS

## 2018-04-27 MED ORDER — CIPROFLOXACIN HCL 500 MG PO TABS: 500.0000 mg | ORAL_TABLET | Freq: Two times a day (BID) | ORAL | 0 refills | Status: AC

## 2018-04-27 MED ORDER — CEFAZOLIN SODIUM-DEXTROSE 2-4 GM/100ML-% IV SOLN
INTRAVENOUS | Status: AC
  Filled 2018-04-27: qty 100

## 2018-04-27 MED ORDER — PHENAZOPYRIDINE HCL 200 MG PO TABS
200.0000 mg | ORAL_TABLET | Freq: Once | ORAL | Status: AC
  Administered 2018-04-27: 200 mg via ORAL
  Filled 2018-04-27: qty 1

## 2018-04-27 MED ORDER — SUCCINYLCHOLINE CHLORIDE 200 MG/10ML IV SOSY
PREFILLED_SYRINGE | INTRAVENOUS | Status: DC | PRN
  Administered 2018-04-27: 100 mg via INTRAVENOUS

## 2018-04-27 MED ORDER — PROPOFOL 10 MG/ML IV BOLUS
INTRAVENOUS | Status: DC | PRN
  Administered 2018-04-27: 100 mg via INTRAVENOUS

## 2018-04-27 MED ORDER — PHENAZOPYRIDINE HCL 100 MG PO TABS
ORAL_TABLET | ORAL | Status: AC
  Filled 2018-04-27: qty 2

## 2018-04-27 MED ORDER — HYDROCODONE-ACETAMINOPHEN 5-325 MG PO TABS: 1.0000 | ORAL_TABLET | ORAL | 0 refills | Status: DC | PRN

## 2018-04-27 MED ORDER — OXYCODONE HCL 5 MG/5ML PO SOLN
5.0000 mg | Freq: Once | ORAL | Status: DC | PRN
  Filled 2018-04-27: qty 5

## 2018-04-27 MED ORDER — LIDOCAINE 2% (20 MG/ML) 5 ML SYRINGE
INTRAMUSCULAR | Status: AC
  Filled 2018-04-27: qty 5

## 2018-04-27 MED ORDER — PROMETHAZINE HCL 25 MG/ML IJ SOLN
6.2500 mg | INTRAMUSCULAR | Status: DC | PRN
  Filled 2018-04-27: qty 1

## 2018-04-27 MED ORDER — LIDOCAINE 2% (20 MG/ML) 5 ML SYRINGE
INTRAMUSCULAR | Status: DC | PRN
  Administered 2018-04-27: 60 mg via INTRAVENOUS

## 2018-04-27 MED ORDER — FENTANYL CITRATE (PF) 100 MCG/2ML IJ SOLN
INTRAMUSCULAR | Status: DC | PRN
  Administered 2018-04-27: 50 ug via INTRAVENOUS

## 2018-04-27 MED ORDER — SODIUM CHLORIDE 0.9 % IR SOLN
Status: DC | PRN
  Administered 2018-04-27: 6000 mL

## 2018-04-27 MED ORDER — OXYBUTYNIN CHLORIDE 5 MG PO TABS
ORAL_TABLET | ORAL | Status: AC
  Filled 2018-04-27: qty 1

## 2018-04-27 MED ORDER — FENTANYL CITRATE (PF) 100 MCG/2ML IJ SOLN
INTRAMUSCULAR | Status: AC
  Filled 2018-04-27: qty 2

## 2018-04-27 SURGICAL SUPPLY — 29 items
BAG DRAIN URO-CYSTO SKYTR STRL (DRAIN) ×3 IMPLANT
BASKET ZERO TIP NITINOL 2.4FR (BASKET) IMPLANT
BENZOIN TINCTURE PRP APPL 2/3 (GAUZE/BANDAGES/DRESSINGS) IMPLANT
CATH URET 5FR 28IN OPEN ENDED (CATHETERS) IMPLANT
CLOSURE WOUND 1/2 X4 (GAUZE/BANDAGES/DRESSINGS)
CLOTH BEACON ORANGE TIMEOUT ST (SAFETY) ×3 IMPLANT
EXTRACTOR STONE NITINOL NGAGE (UROLOGICAL SUPPLIES) ×3 IMPLANT
FIBER LASER FLEXIVA 365 (UROLOGICAL SUPPLIES) IMPLANT
FIBER LASER TRAC TIP (UROLOGICAL SUPPLIES) IMPLANT
GLOVE BIO SURGEON STRL SZ 6.5 (GLOVE) ×2 IMPLANT
GLOVE BIO SURGEON STRL SZ7.5 (GLOVE) ×3 IMPLANT
GLOVE BIO SURGEONS STRL SZ 6.5 (GLOVE) ×1
GLOVE BIOGEL PI IND STRL 6.5 (GLOVE) ×1 IMPLANT
GLOVE BIOGEL PI INDICATOR 6.5 (GLOVE) ×2
GOWN STRL REUS W/TWL LRG LVL3 (GOWN DISPOSABLE) ×3 IMPLANT
GOWN STRL REUS W/TWL XL LVL3 (GOWN DISPOSABLE) ×3 IMPLANT
GUIDEWIRE ZIPWRE .038 STRAIGHT (WIRE) ×6 IMPLANT
IV NS IRRIG 3000ML ARTHROMATIC (IV SOLUTION) ×6 IMPLANT
KIT TURNOVER CYSTO (KITS) ×3 IMPLANT
MANIFOLD NEPTUNE II (INSTRUMENTS) ×3 IMPLANT
PACK CYSTO (CUSTOM PROCEDURE TRAY) ×3 IMPLANT
SHEATH URETERAL 12FRX28CM (UROLOGICAL SUPPLIES) ×3 IMPLANT
STENT URET 6FRX24 CONTOUR (STENTS) ×3 IMPLANT
STRIP CLOSURE SKIN 1/2X4 (GAUZE/BANDAGES/DRESSINGS) IMPLANT
SYR 10ML LL (SYRINGE) ×3 IMPLANT
TUBE CONNECTING 12'X1/4 (SUCTIONS) ×1
TUBE CONNECTING 12X1/4 (SUCTIONS) ×2 IMPLANT
TUBING UROLOGY SET (TUBING) ×3 IMPLANT
WATER STERILE IRR 1000ML POUR (IV SOLUTION) ×3 IMPLANT

## 2018-04-27 NOTE — Anesthesia Preprocedure Evaluation (Addendum)
Anesthesia Evaluation  Patient identified by MRN, date of birth, ID band Patient awake    Reviewed: Allergy & Precautions, NPO status , Patient's Chart, lab work & pertinent test results  Airway Mallampati: II  TM Distance: >3 FB Neck ROM: Full    Dental  (+) Edentulous Upper   Pulmonary neg pulmonary ROS, Current Smoker,    Pulmonary exam normal breath sounds clear to auscultation       Cardiovascular hypertension, Pt. on medications negative cardio ROS Normal cardiovascular exam Rhythm:Regular Rate:Normal     Neuro/Psych negative neurological ROS  negative psych ROS   GI/Hepatic Neg liver ROS, GERD  ,  Endo/Other  negative endocrine ROS  Renal/GU negative Renal ROS  negative genitourinary   Musculoskeletal negative musculoskeletal ROS (+)   Abdominal   Peds negative pediatric ROS (+)  Hematology negative hematology ROS (+)   Anesthesia Other Findings   Reproductive/Obstetrics negative OB ROS                            Anesthesia Physical Anesthesia Plan  ASA: II  Anesthesia Plan: General   Post-op Pain Management:    Induction: Intravenous, Rapid sequence and Cricoid pressure planned  PONV Risk Score and Plan: 2 and Ondansetron and Midazolam  Airway Management Planned: Oral ETT  Additional Equipment:   Intra-op Plan:   Post-operative Plan: Extubation in OR  Informed Consent: I have reviewed the patients History and Physical, chart, labs and discussed the procedure including the risks, benefits and alternatives for the proposed anesthesia with the patient or authorized representative who has indicated his/her understanding and acceptance.   Dental advisory given  Plan Discussed with: CRNA  Anesthesia Plan Comments:        Anesthesia Quick Evaluation

## 2018-04-27 NOTE — Anesthesia Procedure Notes (Signed)
Procedure Name: Intubation Date/Time: 04/27/2018 10:04 AM Performed by: Eulas Post, Artie Takayama W, CRNA Pre-anesthesia Checklist: Patient identified, Emergency Drugs available, Suction available and Patient being monitored Patient Re-evaluated:Patient Re-evaluated prior to induction Oxygen Delivery Method: Circle system utilized Preoxygenation: Pre-oxygenation with 100% oxygen Induction Type: IV induction, Rapid sequence and Cricoid Pressure applied Ventilation: Mask ventilation without difficulty Laryngoscope Size: Miller and 2 Grade View: Grade I Tube type: Oral Tube size: 7.0 mm Number of attempts: 1 Airway Equipment and Method: Stylet Placement Confirmation: ETT inserted through vocal cords under direct vision,  positive ETCO2 and breath sounds checked- equal and bilateral Secured at: 23 cm Tube secured with: Tape Dental Injury: Teeth and Oropharynx as per pre-operative assessment

## 2018-04-27 NOTE — Op Note (Signed)
Operative Note  Preoperative diagnosis:  1.  Left renal pelvis lesion 2.  Left flank pain 3.  Left hydronephrosis  Postoperative diagnosis: 1.  Multiple left renal stones 2.  Left flank pain 3.  Left hydronephrosis  Procedure(s): 1.  Cystoscopy with left ureteroscopy, basket stone extraction and left JJ stent placement 2.  Cystolitholapaxy of 1 cm stone 3.  Bilateral retrograde pyelograms with intraoperative interpretation of fluoroscopic imaging  Surgeon: Ellison Hughs, MD  Assistants:  None  Anesthesia:  General  Complications:  None  EBL: Less than 5 mL  Specimens: 1.  Multiple left renal stones  Drains/Catheters: 1.  Left 6 French by 24 cm JJ stent without tether  Intraoperative findings:   1. Multiple soft left renal stones measuring approximately 5 mm each.  No evidence of urothelial cancer 2. Solitary right collecting system with no filling defects or dilation involving the right ureter or right renal pelvis seen on retrograde pyelogram 3. Solitary left collecting system with no filling defects or dilation involving the left ureter or left renal pelvis seen on retrograde pyelogram  Indication:  Leslie Boyd is a 72 y.o. female with a 1 month history of intermittent episodes of left flank/left lower quadrant pain associated with nausea/vomiting.  She had a CT of the abdomen/pelvis on 04/02/2018 that showed a pulmonary nodule as well as a lesion in the left renal pelvis, concerning for urothelial carcinoma especially given her long smoking history.  She has been consented for the above procedures, voices understanding and wishes to proceed.  Description of procedure:  After informed consent was obtained, the patient was brought to the operating room and general LMA anesthesia was administered. The patient was then placed in the dorsolithotomy position and prepped and draped in usual sterile fashion. A timeout was performed. A 23 French rigid cystoscope was then  inserted into the urethral meatus and advanced into the bladder under direct vision. A complete bladder survey revealed a 1 cm bladder stone that was evacuated through the cystoscope sheath.  A 5 French ureteral catheter was then inserted into the left ureteral orifice and a left retrograde pyelogram was obtained, with the findings listed above.  A right retrograde pyelogram was obtained in a similar fashion, with the findings listed above.  A Glidewire was then advanced into the lumen of the left ureter and up to the left renal pelvis, under fluoroscopic guidance.  A flexible ureteroscope was then advanced alongside the wire up the left ureter and into position within the left renal pelvis.  Inspection of the left renal pelvis revealed multiple stones that appeared soft in consistency.  An additional Glidewire was placed through the flexible scope and a ureteral access sheath was placed over the working wire.  An engage basket was then used to extract the majority of her stone stones, however, I could not extract all of them due to the soft nature of the stones.  I did not move forward with laser lithotripsy as the stones would not fragment due to the soft consistency.  I extensively flushed the left renal pelvis and more of the stone debris flushed down the ureteral access sheath.  I inspected all renal calyces as well as the left renal pelvis and found no evidence of urothelial malignancy or other urothelial lesions involving the left collecting system.  The flexible ureteroscope and ureteral access sheath were then removed under direct vision, revealing no evidence of ureteral trauma or pathology.  A 6 [place] by 24 cm JJ  stent was then placed over the wire and into good position within the left collecting system, confirming placement via fluoroscopy.  The patient's bladder was drained.  She tolerated the procedure well and was transferred to the postanesthesia in stable condition.  Plan: Follow-up in 1  week for office cystoscopy and left JJ stent removal.    CC: Dr. Erasmo Leventhal

## 2018-04-27 NOTE — Discharge Instructions (Signed)

## 2018-04-27 NOTE — Transfer of Care (Signed)
Immediate Anesthesia Transfer of Care Note  Patient: Leslie Boyd  Procedure(s) Performed: CYSTOSCOPY WITH BILATERAL RETROGRADE PYELOGRAM, POSSIBLE LEFT URETEROSCOPY AND POSSIBLE STENT PLACEMENT (Left Ureter)  Patient Location: PACU  Anesthesia Type:General  Level of Consciousness: awake  Airway & Oxygen Therapy: Patient Spontanous Breathing and Patient connected to nasal cannula oxygen  Post-op Assessment: Report given to RN and Post -op Vital signs reviewed and stable  Post vital signs: Reviewed and stable  Last Vitals:  Vitals Value Taken Time  BP 126/65 04/27/2018 10:58 AM  Temp    Pulse 85 04/27/2018 11:00 AM  Resp 19 04/27/2018 11:00 AM  SpO2 100 % 04/27/2018 11:00 AM  Vitals shown include unvalidated device data.  Last Pain:  Vitals:   04/27/18 0910  TempSrc: Oral  PainSc: 10-Worst pain ever      Patients Stated Pain Goal: 7 (72/09/47 0962)  Complications: No apparent anesthesia complications

## 2018-04-27 NOTE — Anesthesia Postprocedure Evaluation (Signed)
Anesthesia Post Note  Patient: Leslie Boyd  Procedure(s) Performed: CYSTOSCOPY WITH BILATERAL RETROGRADE PYELOGRAM, POSSIBLE LEFT URETEROSCOPY AND POSSIBLE STENT PLACEMENT (Left Ureter)     Patient location during evaluation: PACU Anesthesia Type: General Level of consciousness: awake and alert Pain management: pain level controlled Vital Signs Assessment: post-procedure vital signs reviewed and stable Respiratory status: spontaneous breathing, nonlabored ventilation and respiratory function stable Cardiovascular status: blood pressure returned to baseline and stable Postop Assessment: no apparent nausea or vomiting Anesthetic complications: no    Last Vitals:  Vitals:   04/27/18 1130 04/27/18 1211  BP: 130/69 134/67  Pulse: 92 97  Resp: (!) 22 20  Temp:  36.8 C  SpO2: 95% 97%    Last Pain:  Vitals:   04/27/18 1115  TempSrc:   PainSc: 0-No pain                 Lynda Rainwater

## 2018-04-28 ENCOUNTER — Encounter (HOSPITAL_BASED_OUTPATIENT_CLINIC_OR_DEPARTMENT_OTHER): Payer: Self-pay | Admitting: Urology

## 2018-04-28 NOTE — Pre-Procedure Instructions (Signed)
Leslie Boyd  04/28/2018      [id number] [place], [place] - [address] [place] [address] Phone: [phone] Fax: [phone]    Your procedure is scheduled on October 28  Report to [facility] at 7:30 A.M.  Call this number if you have problems the morning of surgery:  [phone]   Remember:  Do not eat or drink after midnight.     Take these medicines the morning of surgery with A SIP OF WATER    Zantac   Nasal Spray - if needed   7 days prior to surgery STOP taking any Aspirin(unless otherwise instructed by your surgeon), Aleve, Naproxen, Ibuprofen, Motrin, Advil, Goody's, BC's, all herbal medications, fish oil, and all vitamins    Do not wear jewelry, make-up or nail polish.  Do not wear lotions, powders, or perfumes, or deodorant.  Do not shave 48 hours prior to surgery.  Men may shave face and neck.  Do not bring valuables to the hospital.  [facility] is not responsible for any belongings or valuables.   [facility]- Preparing For Surgery  Before surgery, you can play an important role. Because skin is not sterile, your skin needs to be as free of germs as possible. You can reduce the number of germs on your skin by washing with CHG (chlorahexidine gluconate) Soap before surgery.  CHG is an antiseptic cleaner which kills germs and bonds with the skin to continue killing germs even after washing.    Oral Hygiene is also important to reduce your risk of infection.  Remember - BRUSH YOUR TEETH THE MORNING OF SURGERY WITH YOUR REGULAR TOOTHPASTE  Please do not use if you have an allergy to CHG or antibacterial soaps. If your skin becomes reddened/irritated stop using the CHG.  Do not shave (including legs and underarms) for at least 48 hours prior to first CHG shower. It is OK to shave your face.  Please follow these instructions carefully.   1. Shower the NIGHT BEFORE SURGERY and the MORNING OF  SURGERY with CHG.   2. If you chose to wash your hair, wash your hair first as usual with your normal shampoo.  3. After you shampoo, rinse your hair and body thoroughly to remove the shampoo.  4. Use CHG as you would any other liquid soap. You can apply CHG directly to the skin and wash gently with a scrungie or a clean washcloth.   5. Apply the CHG Soap to your body ONLY FROM THE NECK DOWN.  Do not use on open wounds or open sores. Avoid contact with your eyes, ears, mouth and genitals (private parts). Wash Face and genitals (private parts)  with your normal soap.  6. Wash thoroughly, paying special attention to the area where your surgery will be performed.  7. Thoroughly rinse your body with warm water from the neck down.  8. DO NOT shower/wash with your normal soap after using and rinsing off the CHG Soap.  9. Pat yourself dry with a CLEAN TOWEL.  10. Wear CLEAN PAJAMAS to bed the night before surgery, wear comfortable clothes the morning of surgery  11. Place CLEAN SHEETS on your bed the night of your first shower and DO NOT SLEEP WITH PETS.    Day of Surgery:  Do not apply any deodorants/lotions.  Please wear clean clothes to the hospital/surgery center.   Remember to brush your teeth  WITH YOUR REGULAR TOOTHPASTE.   Contacts, dentures or bridgework may not be worn into surgery.  Leave your suitcase in the car.  After surgery it may be brought to your room.  For patients admitted to the hospital, discharge time will be determined by your treatment team.  Patients discharged the day of surgery will not be allowed to drive home.    Please read over the following fact sheets that you were given. Coughing and Deep Breathing and Surgical Site Infection Prevention

## 2018-04-29 ENCOUNTER — Encounter (HOSPITAL_COMMUNITY)
Admission: RE | Admit: 2018-04-29 | Discharge: 2018-04-29 | Disposition: A | Discharge status: Home / Self Care | Payer: Medicare HMO | Source: Ambulatory Visit | Attending: Thoracic Surgery (Cardiothoracic Vascular Surgery) | Admitting: Thoracic Surgery (Cardiothoracic Vascular Surgery)

## 2018-04-29 ENCOUNTER — Encounter (HOSPITAL_COMMUNITY): Payer: Self-pay

## 2018-04-29 ENCOUNTER — Other Ambulatory Visit: Payer: Self-pay

## 2018-04-29 DIAGNOSIS — Z01818 Encounter for other preprocedural examination: Secondary | ICD-10-CM | POA: Diagnosis present

## 2018-04-29 DIAGNOSIS — R911 Solitary pulmonary nodule: Secondary | ICD-10-CM | POA: Diagnosis not present

## 2018-04-29 HISTORY — DX: Personal history of urinary calculi: Z87.442

## 2018-04-29 HISTORY — DX: Anxiety disorder, unspecified: F41.9

## 2018-04-29 HISTORY — DX: Unspecified osteoarthritis, unspecified site: M19.90

## 2018-04-29 LAB — BLOOD GAS, ARTERIAL
Acid-Base Excess: 1.9 mmol/L (ref 0.0–2.0)
Bicarbonate: 25.5 mmol/L (ref 20.0–28.0)
Drawn by: 470591
FIO2: 21
O2 Saturation: 99.1 %
PATIENT TEMPERATURE: 98.6
PO2 ART: 139 mmHg — AB (ref 83.0–108.0)
pCO2 arterial: 37 mmHg (ref 32.0–48.0)
pH, Arterial: 7.453 — ABNORMAL HIGH (ref 7.350–7.450)

## 2018-04-29 LAB — COMPREHENSIVE METABOLIC PANEL
ALT: 12 U/L (ref 0–44)
AST: 22 U/L (ref 15–41)
Albumin: 4.4 g/dL (ref 3.5–5.0)
Alkaline Phosphatase: 59 U/L (ref 38–126)
Anion gap: 12 (ref 5–15)
BUN: 15 mg/dL (ref 8–23)
CHLORIDE: 104 mmol/L (ref 98–111)
CO2: 24 mmol/L (ref 22–32)
CREATININE: 0.63 mg/dL (ref 0.44–1.00)
Calcium: 9.8 mg/dL (ref 8.9–10.3)
GFR calc Af Amer: >60 mL/min (ref >60)
GFR calc non Af Amer: >60 mL/min (ref >60)
GLUCOSE: 104 mg/dL — AB (ref 70–99)
Potassium: 3.6 mmol/L (ref 3.5–5.1)
SODIUM: 140 mmol/L (ref 135–145)
Total Bilirubin: 0.6 mg/dL (ref 0.3–1.2)
Total Protein: 6.8 g/dL (ref 6.5–8.1)

## 2018-04-29 LAB — TYPE AND SCREEN
ABO/RH(D): O POS
Antibody Screen: NEGATIVE

## 2018-04-29 LAB — CBC
HCT: 40 % (ref 36.0–46.0)
Hemoglobin: 12.8 g/dL (ref 12.0–15.0)
MCH: 30.3 pg (ref 26.0–34.0)
MCHC: 32 g/dL (ref 30.0–36.0)
MCV: 94.6 fL (ref 80.0–100.0)
NRBC: 0 % (ref 0.0–0.2)
PLATELETS: 323 10*3/uL (ref 150–400)
RBC: 4.23 MIL/uL (ref 3.87–5.11)
RDW: 13.2 % (ref 11.5–15.5)
WBC: 13.5 10*3/uL — ABNORMAL HIGH (ref 4.0–10.5)

## 2018-04-29 LAB — URINALYSIS, ROUTINE W REFLEX MICROSCOPIC
BILIRUBIN URINE: NEGATIVE
Glucose, UA: NEGATIVE mg/dL
Ketones, ur: NEGATIVE mg/dL
NITRITE: NEGATIVE
PH: 6 (ref 5.0–8.0)
Protein, ur: 100 mg/dL — AB
RBC / HPF: >50 RBC/hpf — ABNORMAL HIGH (ref 0–5)
SPECIFIC GRAVITY, URINE: 1.003 — AB (ref 1.005–1.030)

## 2018-04-29 LAB — PROTIME-INR
INR: 0.99
Prothrombin Time: 13 seconds (ref 11.4–15.2)

## 2018-04-29 LAB — ABO/RH: ABO/RH(D): O POS

## 2018-04-29 LAB — APTT: APTT: 34 s (ref 24–36)

## 2018-04-29 LAB — SURGICAL PCR SCREEN
MRSA, PCR: NEGATIVE
STAPHYLOCOCCUS AUREUS: NEGATIVE

## 2018-04-30 NOTE — Anesthesia Preprocedure Evaluation (Addendum)
Anesthesia Evaluation  Patient identified by MRN, date of birth, ID band Patient awake    Reviewed: Allergy & Precautions, NPO status , Patient's Chart, lab work & pertinent test results  Airway Mallampati: I  TM Distance: >3 FB Neck ROM: Full    Dental   Pulmonary Current Smoker,    Pulmonary exam normal        Cardiovascular hypertension, Pt. on medications Normal cardiovascular exam     Neuro/Psych Anxiety    GI/Hepatic GERD  Medicated and Controlled,  Endo/Other    Renal/GU      Musculoskeletal   Abdominal   Peds  Hematology   Anesthesia Other Findings   Reproductive/Obstetrics                            Anesthesia Physical Anesthesia Plan  ASA: III  Anesthesia Plan: General   Post-op Pain Management:    Induction: Intravenous  PONV Risk Score and Plan: 1 and Ondansetron and Treatment may vary due to age or medical condition  Airway Management Planned: Oral ETT  Additional Equipment: Arterial line, CVP and Ultrasound Guidance Line Placement  Intra-op Plan:   Post-operative Plan: Extubation in OR  Informed Consent:   Plan Discussed with: CRNA and Surgeon  Anesthesia Plan Comments: (Cardiac clearance by Dr. Atilano Median stating pt low risk. Stress echo in care [facility]where 01/29/18 "Functional capacity is fair for age/sex -7 mets on the 2-min Bruce protocol. Appropriate hemodynamic response to exercise. ST-T wave changes with exercise. EKG portion is positive for ischemia by diagnostic criteria. Normal LV systolic function at rest with no stress induced segmental wall motion abnormalities. Negative echo portion of the test.")       Anesthesia Quick Evaluation

## 2018-05-03 ENCOUNTER — Inpatient Hospital Stay (HOSPITAL_COMMUNITY): Payer: Medicare HMO | Admitting: Physician Assistant

## 2018-05-03 ENCOUNTER — Encounter (HOSPITAL_COMMUNITY)
Admission: RE | Disposition: A | Discharge status: Home / Self Care | Payer: Self-pay | Source: Home / Self Care | Attending: Thoracic Surgery (Cardiothoracic Vascular Surgery)

## 2018-05-03 ENCOUNTER — Inpatient Hospital Stay (HOSPITAL_COMMUNITY): Payer: Medicare HMO

## 2018-05-03 ENCOUNTER — Encounter (HOSPITAL_COMMUNITY): Payer: Self-pay | Admitting: Surgery

## 2018-05-03 ENCOUNTER — Inpatient Hospital Stay (HOSPITAL_COMMUNITY): Payer: Medicare HMO | Admitting: Anesthesiology

## 2018-05-03 ENCOUNTER — Inpatient Hospital Stay (HOSPITAL_COMMUNITY)
Admission: RE | Admit: 2018-05-03 | Discharge: 2018-05-07 | DRG: 164 | Disposition: A | Discharge status: Home / Self Care | Payer: Medicare HMO | Attending: Thoracic Surgery (Cardiothoracic Vascular Surgery) | Admitting: Thoracic Surgery (Cardiothoracic Vascular Surgery)

## 2018-05-03 ENCOUNTER — Other Ambulatory Visit: Payer: Self-pay

## 2018-05-03 DIAGNOSIS — J939 Pneumothorax, unspecified: Secondary | ICD-10-CM

## 2018-05-03 DIAGNOSIS — J95811 Postprocedural pneumothorax: Secondary | ICD-10-CM | POA: Diagnosis not present

## 2018-05-03 DIAGNOSIS — J9811 Atelectasis: Secondary | ICD-10-CM | POA: Diagnosis not present

## 2018-05-03 DIAGNOSIS — K219 Gastro-esophageal reflux disease without esophagitis: Secondary | ICD-10-CM | POA: Diagnosis present

## 2018-05-03 DIAGNOSIS — Z9842 Cataract extraction status, left eye: Secondary | ICD-10-CM

## 2018-05-03 DIAGNOSIS — I10 Essential (primary) hypertension: Secondary | ICD-10-CM | POA: Diagnosis present

## 2018-05-03 DIAGNOSIS — R911 Solitary pulmonary nodule: Secondary | ICD-10-CM

## 2018-05-03 DIAGNOSIS — C3412 Malignant neoplasm of upper lobe, left bronchus or lung: Principal | ICD-10-CM | POA: Diagnosis present

## 2018-05-03 DIAGNOSIS — M8580 Other specified disorders of bone density and structure, unspecified site: Secondary | ICD-10-CM | POA: Diagnosis present

## 2018-05-03 DIAGNOSIS — Z4682 Encounter for fitting and adjustment of non-vascular catheter: Secondary | ICD-10-CM

## 2018-05-03 DIAGNOSIS — Z961 Presence of intraocular lens: Secondary | ICD-10-CM | POA: Diagnosis present

## 2018-05-03 DIAGNOSIS — Z902 Acquired absence of lung [part of]: Secondary | ICD-10-CM

## 2018-05-03 DIAGNOSIS — Y836 Removal of other organ (partial) (total) as the cause of abnormal reaction of the patient, or of later complication, without mention of misadventure at the time of the procedure: Secondary | ICD-10-CM | POA: Diagnosis not present

## 2018-05-03 DIAGNOSIS — F419 Anxiety disorder, unspecified: Secondary | ICD-10-CM | POA: Diagnosis present

## 2018-05-03 DIAGNOSIS — F17210 Nicotine dependence, cigarettes, uncomplicated: Secondary | ICD-10-CM | POA: Diagnosis present

## 2018-05-03 DIAGNOSIS — Z9841 Cataract extraction status, right eye: Secondary | ICD-10-CM | POA: Diagnosis not present

## 2018-05-03 DIAGNOSIS — N1330 Unspecified hydronephrosis: Secondary | ICD-10-CM | POA: Diagnosis present

## 2018-05-03 DIAGNOSIS — D649 Anemia, unspecified: Secondary | ICD-10-CM | POA: Diagnosis present

## 2018-05-03 DIAGNOSIS — Z79899 Other long term (current) drug therapy: Secondary | ICD-10-CM | POA: Diagnosis not present

## 2018-05-03 DIAGNOSIS — J918 Pleural effusion in other conditions classified elsewhere: Secondary | ICD-10-CM | POA: Diagnosis not present

## 2018-05-03 DIAGNOSIS — C3492 Malignant neoplasm of unspecified part of left bronchus or lung: Secondary | ICD-10-CM

## 2018-05-03 DIAGNOSIS — R000 Tachycardia, unspecified: Secondary | ICD-10-CM | POA: Diagnosis present

## 2018-05-03 DIAGNOSIS — E785 Hyperlipidemia, unspecified: Secondary | ICD-10-CM | POA: Diagnosis present

## 2018-05-03 DIAGNOSIS — I2510 Atherosclerotic heart disease of native coronary artery without angina pectoris: Secondary | ICD-10-CM | POA: Diagnosis present

## 2018-05-03 DIAGNOSIS — Z09 Encounter for follow-up examination after completed treatment for conditions other than malignant neoplasm: Secondary | ICD-10-CM

## 2018-05-03 HISTORY — PX: VIDEO ASSISTED THORACOSCOPY (VATS)/WEDGE RESECTION: SHX6174

## 2018-05-03 LAB — POCT I-STAT 7, (LYTES, BLD GAS, ICA,H+H)
Acid-Base Excess: 4 mmol/L — ABNORMAL HIGH (ref 0.0–2.0)
Acid-base deficit: 1 mmol/L (ref 0.0–2.0)
Bicarbonate: 24.4 mmol/L (ref 20.0–28.0)
Bicarbonate: 29.1 mmol/L — ABNORMAL HIGH (ref 20.0–28.0)
Calcium, Ion: 1.19 mmol/L (ref 1.15–1.40)
Calcium, Ion: 1.15 mmol/L (ref 1.15–1.40)
HCT: 30 % — ABNORMAL LOW (ref 36.0–46.0)
HEMATOCRIT: 30 % — AB (ref 36.0–46.0)
HEMOGLOBIN: 10.2 g/dL — AB (ref 12.0–15.0)
HEMOGLOBIN: 10.2 g/dL — AB (ref 12.0–15.0)
O2 SAT: 100 %
O2 Saturation: 94 %
PCO2 ART: 39.4 mmHg (ref 32.0–48.0)
PCO2 ART: 41.4 mmHg (ref 32.0–48.0)
PH ART: 7.45 (ref 7.350–7.450)
PO2 ART: 510 mmHg — AB (ref 83.0–108.0)
POTASSIUM: 3.2 mmol/L — AB (ref 3.5–5.1)
Patient temperature: 35.5
Potassium: 3.5 mmol/L (ref 3.5–5.1)
Sodium: 143 mmol/L (ref 135–145)
Sodium: 140 mmol/L (ref 135–145)
TCO2: 26 mmol/L (ref 22–32)
TCO2: 30 mmol/L (ref 22–32)
pH, Arterial: 7.393 (ref 7.350–7.450)
pO2, Arterial: 66 mmHg — ABNORMAL LOW (ref 83.0–108.0)

## 2018-05-03 SURGERY — VIDEO ASSISTED THORACOSCOPY (VATS)/WEDGE RESECTION
Anesthesia: General | Site: Chest | Laterality: Left

## 2018-05-03 MED ORDER — SODIUM CHLORIDE 0.9 % IJ SOLN
INTRAMUSCULAR | Status: DC | PRN
  Administered 2018-05-03: 50 mL via INTRAVENOUS

## 2018-05-03 MED ORDER — SENNOSIDES-DOCUSATE SODIUM 8.6-50 MG PO TABS
1.0000 | ORAL_TABLET | Freq: Every day | ORAL | Status: DC
  Administered 2018-05-06: 1 via ORAL
  Filled 2018-05-03: qty 1

## 2018-05-03 MED ORDER — SALINE SPRAY 0.65 % NA SOLN
1.0000 | Freq: Four times a day (QID) | NASAL | Status: DC | PRN
  Filled 2018-05-03: qty 44

## 2018-05-03 MED ORDER — FENTANYL CITRATE (PF) 100 MCG/2ML IJ SOLN
50.0000 ug | Freq: Once | INTRAMUSCULAR | Status: AC
  Administered 2018-05-03: 50 ug via INTRAVENOUS

## 2018-05-03 MED ORDER — ACETAMINOPHEN 160 MG/5ML PO SOLN: 1000.0000 mg | Freq: Four times a day (QID) | ORAL | Status: DC

## 2018-05-03 MED ORDER — MIDAZOLAM HCL 2 MG/2ML IJ SOLN
2.0000 mg | Freq: Once | INTRAMUSCULAR | Status: AC
  Administered 2018-05-03: 2 mg via INTRAVENOUS

## 2018-05-03 MED ORDER — OXYCODONE HCL 5 MG PO TABS: 5.0000 mg | ORAL_TABLET | ORAL | Status: DC | PRN

## 2018-05-03 MED ORDER — AMLODIPINE BESYLATE 5 MG PO TABS
5.0000 mg | ORAL_TABLET | Freq: Every day | ORAL | Status: DC
  Administered 2018-05-04 – 2018-05-07 (×4): 5 mg via ORAL
  Filled 2018-05-03 (×5): qty 1

## 2018-05-03 MED ORDER — PHENAZOPYRIDINE HCL 200 MG PO TABS: 200.0000 mg | ORAL_TABLET | Freq: Three times a day (TID) | ORAL | Status: DC | PRN

## 2018-05-03 MED ORDER — POTASSIUM CHLORIDE 10 MEQ/50ML IV SOLN: 10.0000 meq | Freq: Every day | INTRAVENOUS | Status: DC | PRN

## 2018-05-03 MED ORDER — BUPIVACAINE HCL (PF) 0.5 % IJ SOLN
INTRAMUSCULAR | Status: DC | PRN
  Administered 2018-05-03: 30 mL

## 2018-05-03 MED ORDER — FENTANYL CITRATE (PF) 250 MCG/5ML IJ SOLN
INTRAMUSCULAR | Status: AC
  Filled 2018-05-03: qty 5

## 2018-05-03 MED ORDER — FAMOTIDINE 20 MG PO TABS
20.0000 mg | ORAL_TABLET | Freq: Every day | ORAL | Status: DC
  Administered 2018-05-04 – 2018-05-07 (×4): 20 mg via ORAL
  Filled 2018-05-03 (×4): qty 1

## 2018-05-03 MED ORDER — DEXAMETHASONE SODIUM PHOSPHATE 10 MG/ML IJ SOLN
INTRAMUSCULAR | Status: AC
  Filled 2018-05-03: qty 2

## 2018-05-03 MED ORDER — SUGAMMADEX SODIUM 200 MG/2ML IV SOLN
INTRAVENOUS | Status: DC | PRN
  Administered 2018-05-03: 100 mg via INTRAVENOUS

## 2018-05-03 MED ORDER — ONDANSETRON HCL 4 MG/2ML IJ SOLN: 4.0000 mg | Freq: Four times a day (QID) | INTRAMUSCULAR | Status: DC | PRN

## 2018-05-03 MED ORDER — HEMOSTATIC AGENTS (NO CHARGE) OPTIME
TOPICAL | Status: DC | PRN
  Administered 2018-05-03: 1 via TOPICAL

## 2018-05-03 MED ORDER — PHENYLEPHRINE 40 MCG/ML (10ML) SYRINGE FOR IV PUSH (FOR BLOOD PRESSURE SUPPORT)
PREFILLED_SYRINGE | INTRAVENOUS | Status: DC | PRN
  Administered 2018-05-03: 80 ug via INTRAVENOUS

## 2018-05-03 MED ORDER — BUPIVACAINE LIPOSOME 1.3 % IJ SUSP
20.0000 mL | INTRAMUSCULAR | Status: AC
  Administered 2018-05-03: 20 mL
  Filled 2018-05-03: qty 20

## 2018-05-03 MED ORDER — FENTANYL CITRATE (PF) 100 MCG/2ML IJ SOLN
INTRAMUSCULAR | Status: DC | PRN
  Administered 2018-05-03: 100 ug via INTRAVENOUS
  Administered 2018-05-03 (×3): 50 ug via INTRAVENOUS
  Administered 2018-05-03: 25 ug via INTRAVENOUS

## 2018-05-03 MED ORDER — LEVALBUTEROL HCL 0.63 MG/3ML IN NEBU
0.6300 mg | INHALATION_SOLUTION | Freq: Two times a day (BID) | RESPIRATORY_TRACT | Status: DC
  Administered 2018-05-04 – 2018-05-07 (×7): 0.63 mg via RESPIRATORY_TRACT
  Filled 2018-05-03 (×7): qty 3

## 2018-05-03 MED ORDER — FENTANYL 40 MCG/ML IV SOLN
INTRAVENOUS | Status: DC
  Administered 2018-05-03: 20 ug via INTRAVENOUS
  Administered 2018-05-03: 1000 ug via INTRAVENOUS
  Administered 2018-05-03 – 2018-05-04 (×2): 60 ug via INTRAVENOUS
  Administered 2018-05-04: 10 ug via INTRAVENOUS
  Administered 2018-05-04: 20 ug via INTRAVENOUS
  Administered 2018-05-04: 0 ug via INTRAVENOUS
  Administered 2018-05-04: 30 ug via INTRAVENOUS
  Administered 2018-05-04: 10 ug via INTRAVENOUS
  Administered 2018-05-05: 90 ug via INTRAVENOUS
  Administered 2018-05-05: 10 ug via INTRAVENOUS
  Administered 2018-05-05: 40 ug via INTRAVENOUS
  Administered 2018-05-05: 100 ug via INTRAVENOUS
  Administered 2018-05-05: 70 ug via INTRAVENOUS
  Administered 2018-05-05: 20 ug via INTRAVENOUS
  Administered 2018-05-06: 40 ug via INTRAVENOUS
  Filled 2018-05-03: qty 25

## 2018-05-03 MED ORDER — SUGAMMADEX SODIUM 500 MG/5ML IV SOLN
INTRAVENOUS | Status: AC
  Filled 2018-05-03: qty 5

## 2018-05-03 MED ORDER — DEXAMETHASONE SODIUM PHOSPHATE 10 MG/ML IJ SOLN
INTRAMUSCULAR | Status: DC | PRN
  Administered 2018-05-03: 10 mg via INTRAVENOUS

## 2018-05-03 MED ORDER — FENTANYL CITRATE (PF) 100 MCG/2ML IJ SOLN
INTRAMUSCULAR | Status: AC
  Administered 2018-05-03: 50 ug via INTRAVENOUS
  Filled 2018-05-03: qty 2

## 2018-05-03 MED ORDER — LIDOCAINE 2% (20 MG/ML) 5 ML SYRINGE
INTRAMUSCULAR | Status: AC
  Filled 2018-05-03: qty 5

## 2018-05-03 MED ORDER — HYDROMORPHONE HCL 1 MG/ML IJ SOLN
INTRAMUSCULAR | Status: AC
  Administered 2018-05-03: 0.25 mg via INTRAVENOUS
  Filled 2018-05-03: qty 1

## 2018-05-03 MED ORDER — ONDANSETRON HCL 4 MG/2ML IJ SOLN
INTRAMUSCULAR | Status: AC
  Filled 2018-05-03: qty 2

## 2018-05-03 MED ORDER — ONDANSETRON HCL 4 MG/2ML IJ SOLN
INTRAMUSCULAR | Status: AC
  Filled 2018-05-03: qty 4

## 2018-05-03 MED ORDER — OXYBUTYNIN CHLORIDE 5 MG PO TABS
5.0000 mg | ORAL_TABLET | Freq: Three times a day (TID) | ORAL | Status: DC | PRN
  Filled 2018-05-03: qty 1

## 2018-05-03 MED ORDER — BUPIVACAINE HCL (PF) 0.5 % IJ SOLN
INTRAMUSCULAR | Status: AC
  Filled 2018-05-03: qty 30

## 2018-05-03 MED ORDER — LACTATED RINGERS IV SOLN
INTRAVENOUS | Status: DC | PRN
  Administered 2018-05-03: 11:00:00 via INTRAVENOUS

## 2018-05-03 MED ORDER — CEFAZOLIN SODIUM-DEXTROSE 2-4 GM/100ML-% IV SOLN
2.0000 g | Freq: Three times a day (TID) | INTRAVENOUS | Status: AC
  Administered 2018-05-04: 2 g via INTRAVENOUS
  Filled 2018-05-03 (×2): qty 100

## 2018-05-03 MED ORDER — CEFAZOLIN SODIUM-DEXTROSE 2-4 GM/100ML-% IV SOLN
2.0000 g | INTRAVENOUS | Status: AC
  Administered 2018-05-03: 2 g via INTRAVENOUS
  Filled 2018-05-03: qty 100

## 2018-05-03 MED ORDER — LACTATED RINGERS IV SOLN
INTRAVENOUS | Status: DC | PRN
  Administered 2018-05-03 (×2): via INTRAVENOUS

## 2018-05-03 MED ORDER — LEVALBUTEROL HCL 0.63 MG/3ML IN NEBU
0.6300 mg | INHALATION_SOLUTION | Freq: Four times a day (QID) | RESPIRATORY_TRACT | Status: DC
  Administered 2018-05-03: 0.63 mg via RESPIRATORY_TRACT
  Filled 2018-05-03: qty 3

## 2018-05-03 MED ORDER — ROCURONIUM BROMIDE 50 MG/5ML IV SOSY
PREFILLED_SYRINGE | INTRAVENOUS | Status: AC
  Filled 2018-05-03: qty 5

## 2018-05-03 MED ORDER — LIDOCAINE 2% (20 MG/ML) 5 ML SYRINGE
INTRAMUSCULAR | Status: DC | PRN
  Administered 2018-05-03: 100 mg via INTRAVENOUS

## 2018-05-03 MED ORDER — PROPOFOL 10 MG/ML IV BOLUS
INTRAVENOUS | Status: AC
  Filled 2018-05-03: qty 20

## 2018-05-03 MED ORDER — MIDAZOLAM HCL 2 MG/2ML IJ SOLN
INTRAMUSCULAR | Status: AC
  Filled 2018-05-03: qty 2

## 2018-05-03 MED ORDER — ORAL CARE MOUTH RINSE
15.0000 mL | Freq: Two times a day (BID) | OROMUCOSAL | Status: DC
  Administered 2018-05-04 – 2018-05-05 (×2): 15 mL via OROMUCOSAL

## 2018-05-03 MED ORDER — 0.9 % SODIUM CHLORIDE (POUR BTL) OPTIME
TOPICAL | Status: DC | PRN
  Administered 2018-05-03: 2000 mL

## 2018-05-03 MED ORDER — ROCURONIUM BROMIDE 50 MG/5ML IV SOSY
PREFILLED_SYRINGE | INTRAVENOUS | Status: AC
  Filled 2018-05-03: qty 15

## 2018-05-03 MED ORDER — SODIUM CHLORIDE 0.9 % IV SOLN
INTRAVENOUS | Status: DC | PRN
  Administered 2018-05-03: 20 ug/min via INTRAVENOUS

## 2018-05-03 MED ORDER — EPHEDRINE SULFATE-NACL 50-0.9 MG/10ML-% IV SOSY
PREFILLED_SYRINGE | INTRAVENOUS | Status: DC | PRN
  Administered 2018-05-03: 10 mg via INTRAVENOUS

## 2018-05-03 MED ORDER — TRAMADOL HCL 50 MG PO TABS
50.0000 mg | ORAL_TABLET | Freq: Four times a day (QID) | ORAL | Status: DC | PRN
  Administered 2018-05-06: 100 mg via ORAL
  Filled 2018-05-03: qty 2

## 2018-05-03 MED ORDER — ACETAMINOPHEN 500 MG PO TABS
1000.0000 mg | ORAL_TABLET | Freq: Four times a day (QID) | ORAL | Status: DC
  Administered 2018-05-03 – 2018-05-07 (×14): 1000 mg via ORAL
  Filled 2018-05-03 (×14): qty 2

## 2018-05-03 MED ORDER — MIDAZOLAM HCL 2 MG/2ML IJ SOLN
INTRAMUSCULAR | Status: AC
  Administered 2018-05-03: 2 mg via INTRAVENOUS
  Filled 2018-05-03: qty 2

## 2018-05-03 MED ORDER — BISACODYL 5 MG PO TBEC
10.0000 mg | DELAYED_RELEASE_TABLET | Freq: Every day | ORAL | Status: DC
  Administered 2018-05-04 – 2018-05-07 (×4): 10 mg via ORAL
  Filled 2018-05-03 (×4): qty 2

## 2018-05-03 MED ORDER — NALOXONE HCL 0.4 MG/ML IJ SOLN
0.4000 mg | INTRAMUSCULAR | Status: DC | PRN
  Filled 2018-05-03: qty 10

## 2018-05-03 MED ORDER — SODIUM CHLORIDE 0.9 % IV SOLN
INTRAVENOUS | Status: DC
  Administered 2018-05-04: 100 mL/h via INTRAVENOUS

## 2018-05-03 MED ORDER — DIPHENHYDRAMINE HCL 12.5 MG/5ML PO ELIX
12.5000 mg | ORAL_SOLUTION | Freq: Four times a day (QID) | ORAL | Status: DC | PRN
  Filled 2018-05-03: qty 5

## 2018-05-03 MED ORDER — ROCURONIUM BROMIDE 10 MG/ML (PF) SYRINGE
PREFILLED_SYRINGE | INTRAVENOUS | Status: DC | PRN
  Administered 2018-05-03: 50 mg via INTRAVENOUS
  Administered 2018-05-03: 20 mg via INTRAVENOUS
  Administered 2018-05-03: 30 mg via INTRAVENOUS

## 2018-05-03 MED ORDER — HYDROMORPHONE HCL 1 MG/ML IJ SOLN
0.2500 mg | INTRAMUSCULAR | Status: DC | PRN
  Administered 2018-05-03 (×2): 0.25 mg via INTRAVENOUS

## 2018-05-03 MED ORDER — SODIUM CHLORIDE 0.9% FLUSH: 9.0000 mL | INTRAVENOUS | Status: DC | PRN

## 2018-05-03 MED ORDER — PRAVASTATIN SODIUM 40 MG PO TABS
20.0000 mg | ORAL_TABLET | Freq: Every day | ORAL | Status: DC
  Administered 2018-05-04 – 2018-05-07 (×4): 20 mg via ORAL
  Filled 2018-05-03 (×4): qty 1

## 2018-05-03 MED ORDER — DIPHENHYDRAMINE HCL 50 MG/ML IJ SOLN: 12.5000 mg | Freq: Four times a day (QID) | INTRAMUSCULAR | Status: DC | PRN

## 2018-05-03 MED ORDER — ONDANSETRON HCL 4 MG/2ML IJ SOLN: 4.0000 mg | Freq: Once | INTRAMUSCULAR | Status: DC | PRN

## 2018-05-03 MED ORDER — MEPERIDINE HCL 50 MG/ML IJ SOLN: 6.2500 mg | INTRAMUSCULAR | Status: DC | PRN

## 2018-05-03 MED ORDER — PROPOFOL 10 MG/ML IV BOLUS
INTRAVENOUS | Status: DC | PRN
  Administered 2018-05-03: 125 mg via INTRAVENOUS

## 2018-05-03 MED ORDER — EPHEDRINE 5 MG/ML INJ
INTRAVENOUS | Status: AC
  Filled 2018-05-03: qty 10

## 2018-05-03 MED ORDER — LACTATED RINGERS IV SOLN
INTRAVENOUS | Status: DC
  Administered 2018-05-03: 10:00:00 via INTRAVENOUS

## 2018-05-03 SURGICAL SUPPLY — 90 items
CANISTER SUCT 3000ML PPV (MISCELLANEOUS) ×8 IMPLANT
CATH THORACIC 28FR (CATHETERS) ×4 IMPLANT
CATH THORACIC 36FR (CATHETERS) IMPLANT
CATH THORACIC 36FR RT ANG (CATHETERS) IMPLANT
CLIP VESOCCLUDE MED 6/CT (CLIP) ×4 IMPLANT
CONN ST 1/4X3/8  BEN (MISCELLANEOUS)
CONN ST 1/4X3/8 BEN (MISCELLANEOUS) IMPLANT
CONN Y 3/8X3/8X3/8  BEN (MISCELLANEOUS)
CONN Y 3/8X3/8X3/8 BEN (MISCELLANEOUS) IMPLANT
CONT SPEC 4OZ CLIKSEAL STRL BL (MISCELLANEOUS) ×24 IMPLANT
COVER SURGICAL LIGHT HANDLE (MISCELLANEOUS) ×4 IMPLANT
COVER WAND RF STERILE (DRAPES) ×4 IMPLANT
CUTTER ECHEON FLEX ENDO 45 340 (ENDOMECHANICALS) ×16 IMPLANT
DERMABOND ADVANCED (GAUZE/BANDAGES/DRESSINGS) ×2
DERMABOND ADVANCED .7 DNX12 (GAUZE/BANDAGES/DRESSINGS) ×2 IMPLANT
DRAIN CHANNEL 28F RND 3/8 FF (WOUND CARE) IMPLANT
DRAIN CHANNEL 32F RND 10.7 FF (WOUND CARE) IMPLANT
DRAPE HALF SHEET 40X57 (DRAPES) ×4 IMPLANT
DRAPE LAPAROSCOPIC ABDOMINAL (DRAPES) ×4 IMPLANT
DRAPE WARM FLUID 44X44 (DRAPE) ×4 IMPLANT
ELECT BLADE 6.5 EXT (BLADE) ×4 IMPLANT
ELECT REM PT RETURN 9FT ADLT (ELECTROSURGICAL) ×4
ELECTRODE REM PT RTRN 9FT ADLT (ELECTROSURGICAL) ×2 IMPLANT
GAUZE SPONGE 4X4 12PLY STRL (GAUZE/BANDAGES/DRESSINGS) IMPLANT
GLOVE BIO SURGEON STRL SZ 6.5 (GLOVE) ×3 IMPLANT
GLOVE BIO SURGEONS STRL SZ 6.5 (GLOVE) ×1
GLOVE BIOGEL PI IND STRL 7.0 (GLOVE) ×2 IMPLANT
GLOVE BIOGEL PI INDICATOR 7.0 (GLOVE) ×2
GLOVE SURG SIGNA 7.5 PF LTX (GLOVE) ×8 IMPLANT
GLOVE SURG SS PI 6.5 STRL IVOR (GLOVE) ×4 IMPLANT
GOWN STRL REUS W/ TWL LRG LVL3 (GOWN DISPOSABLE) ×4 IMPLANT
GOWN STRL REUS W/ TWL XL LVL3 (GOWN DISPOSABLE) ×4 IMPLANT
GOWN STRL REUS W/TWL LRG LVL3 (GOWN DISPOSABLE) ×4
GOWN STRL REUS W/TWL XL LVL3 (GOWN DISPOSABLE) ×4
HEMOSTAT SURGICEL 2X14 (HEMOSTASIS) IMPLANT
KIT BASIN OR (CUSTOM PROCEDURE TRAY) ×4 IMPLANT
KIT SUCTION CATH 14FR (SUCTIONS) ×4 IMPLANT
KIT TURNOVER KIT B (KITS) ×4 IMPLANT
NEEDLE HYPO 25GX1X1/2 BEV (NEEDLE) ×8 IMPLANT
NEEDLE SPNL 18GX3.5 QUINCKE PK (NEEDLE) ×4 IMPLANT
NEEDLE SPNL 22GX3.5 QUINCKE BK (NEEDLE) ×4 IMPLANT
NS IRRIG 1000ML POUR BTL (IV SOLUTION) ×12 IMPLANT
PACK CHEST (CUSTOM PROCEDURE TRAY) ×4 IMPLANT
PAD ARMBOARD 7.5X6 YLW CONV (MISCELLANEOUS) ×8 IMPLANT
POUCH ENDO CATCH II 15MM (MISCELLANEOUS) ×4 IMPLANT
POUCH SPECIMEN RETRIEVAL 10MM (ENDOMECHANICALS) IMPLANT
SCISSORS ENDO CVD 5DCS (MISCELLANEOUS) IMPLANT
SEALANT PROGEL (MISCELLANEOUS) IMPLANT
SEALANT SURG COSEAL 4ML (VASCULAR PRODUCTS) IMPLANT
SEALANT SURG COSEAL 8ML (VASCULAR PRODUCTS) IMPLANT
SHEARS HARMONIC HDI 20CM (ELECTROSURGICAL) IMPLANT
SOLUTION ANTI FOG 6CC (MISCELLANEOUS) ×4 IMPLANT
SPECIMEN JAR MEDIUM (MISCELLANEOUS) ×4 IMPLANT
SPONGE INTESTINAL PEANUT (DISPOSABLE) ×4 IMPLANT
SPONGE TONSIL TAPE 1 RFD (DISPOSABLE) ×4 IMPLANT
STAPLE RELOAD 2.5MM WHITE (STAPLE) ×24 IMPLANT
STAPLE RELOAD 45 GRN (STAPLE) ×6 IMPLANT
STAPLE RELOAD 45MM GOLD (STAPLE) ×20 IMPLANT
STAPLE RELOAD 45MM GREEN (STAPLE) ×6
STAPLER VASCULAR ECHELON 35 (CUTTER) ×4 IMPLANT
SUT PROLENE 4 0 RB 1 (SUTURE)
SUT PROLENE 4-0 RB1 .5 CRCL 36 (SUTURE) IMPLANT
SUT SILK  1 MH (SUTURE) ×4
SUT SILK 1 MH (SUTURE) ×4 IMPLANT
SUT SILK 1 TIES 10X30 (SUTURE) ×4 IMPLANT
SUT SILK 2 0 SH (SUTURE) IMPLANT
SUT SILK 2 0SH CR/8 30 (SUTURE) IMPLANT
SUT SILK 3 0 SH 30 (SUTURE) IMPLANT
SUT SILK 3 0SH CR/8 30 (SUTURE) ×4 IMPLANT
SUT VIC AB 0 CTX 27 (SUTURE) IMPLANT
SUT VIC AB 1 CTX 27 (SUTURE) ×4 IMPLANT
SUT VIC AB 2-0 CT1 27 (SUTURE)
SUT VIC AB 2-0 CT1 TAPERPNT 27 (SUTURE) IMPLANT
SUT VIC AB 2-0 CTX 36 (SUTURE) ×8 IMPLANT
SUT VIC AB 3-0 MH 27 (SUTURE) IMPLANT
SUT VIC AB 3-0 SH 27 (SUTURE)
SUT VIC AB 3-0 SH 27X BRD (SUTURE) IMPLANT
SUT VIC AB 3-0 X1 27 (SUTURE) ×4 IMPLANT
SUT VICRYL 0 UR6 27IN ABS (SUTURE) IMPLANT
SUT VICRYL 2 TP 1 (SUTURE) IMPLANT
SYR 30ML LL (SYRINGE) ×4 IMPLANT
SYRINGE 20CC LL (MISCELLANEOUS) ×4 IMPLANT
SYSTEM SAHARA CHEST DRAIN ATS (WOUND CARE) ×4 IMPLANT
TIP APPLICATOR SPRAY EXTEND 16 (VASCULAR PRODUCTS) IMPLANT
TOWEL GREEN STERILE (TOWEL DISPOSABLE) ×4 IMPLANT
TOWEL GREEN STERILE FF (TOWEL DISPOSABLE) ×4 IMPLANT
TRAY FOLEY MTR SLVR 16FR STAT (SET/KITS/TRAYS/PACK) ×4 IMPLANT
TROCAR XCEL BLADELESS 5X75MML (TROCAR) ×4 IMPLANT
TROCAR XCEL NON-BLD 5MMX100MML (ENDOMECHANICALS) IMPLANT
WATER STERILE IRR 1000ML POUR (IV SOLUTION) ×8 IMPLANT

## 2018-05-03 NOTE — Anesthesia Postprocedure Evaluation (Signed)
Anesthesia Post Note  Patient: Leslie Boyd  Procedure(s) Performed: VIDEO ASSISTED THORACOSCOPY (VATS)/WEDGE RESECTION (Left Chest)     Patient location during evaluation: PACU Anesthesia Type: General Level of consciousness: awake and alert Pain management: pain level controlled Vital Signs Assessment: post-procedure vital signs reviewed and stable Respiratory status: spontaneous breathing, nonlabored ventilation, respiratory function stable and patient connected to nasal cannula oxygen Cardiovascular status: blood pressure returned to baseline and stable Postop Assessment: no apparent nausea or vomiting Anesthetic complications: no    Last Vitals:  Vitals:   05/03/18 0801  BP: (!) 168/70  Pulse: 82  Resp: 17  Temp: 36.9 C  SpO2: 99%    Last Pain:  Vitals:   05/03/18 1415  PainSc: [name]

## 2018-05-03 NOTE — Transfer of Care (Signed)
Immediate Anesthesia Transfer of Care Note  Patient: Leslie Boyd  Procedure(s) Performed: VIDEO ASSISTED THORACOSCOPY (VATS)/WEDGE RESECTION (Left Chest)  Patient Location: PACU  Anesthesia Type:General  Level of Consciousness: sedated  Airway & Oxygen Therapy: Patient Spontanous Breathing and Patient connected to face mask oxygen  Post-op Assessment: Report given to RN, Post -op Vital signs reviewed and stable and Patient moving all extremities X 4  Post vital signs: Reviewed and stable  Last Vitals:  Vitals Value Taken Time  BP 134/65 05/03/2018  2:14 PM  Temp    Pulse    Resp 18 05/03/2018  2:14 PM  SpO2    Vitals shown include unvalidated device data.  Last Pain:  Vitals:   05/03/18 0845  PainSc: 0-No pain      Patients Stated Pain Goal: 7 (21/30/86 5784)  Complications: No apparent anesthesia complications

## 2018-05-03 NOTE — Interval H&P Note (Signed)
History and Physical Interval Note:  05/03/2018 9:40 AM  Leslie Boyd  has presented today for surgery, with the diagnosis of LUL NODULE  The various methods of treatment have been discussed with the patient and family. After consideration of risks, benefits and other options for treatment, the patient has consented to  Procedure(s): VIDEO ASSISTED THORACOSCOPY (VATS)/WEDGE RESECTION (Left) POSSIBLE LINGULAR SPARING LEFT UPPER LOBECTOMY (Left) as a surgical intervention .  The patient's history has been reviewed, patient examined, no change in status, stable for surgery.  I have reviewed the patient's chart and labs.  Questions were answered to the patient's satisfaction.     [name]

## 2018-05-03 NOTE — Anesthesia Procedure Notes (Signed)
Central Venous Catheter Insertion Performed by: Lillia Abed, MD, anesthesiologist Start/End10/28/2019 9:30 AM, 05/03/2018 9:40 AM Patient location: OR. Preanesthetic checklist: patient identified, IV checked, risks and benefits discussed, surgical consent, monitors and equipment checked, pre-op evaluation, timeout performed and anesthesia consent Position: Trendelenburg Lidocaine 1% used for infiltration and patient sedated Hand hygiene performed , maximum sterile barriers used  and Seldinger technique used Catheter size: 7.5 Fr Central line was placed.Double lumen Procedure performed using ultrasound guided technique. Ultrasound Notes:anatomy identified, needle tip was noted to be adjacent to the nerve/plexus identified, no ultrasound evidence of intravascular and/or intraneural injection and image(s) printed for medical record Following insertion, line sutured, dressing applied and Biopatch. Post procedure assessment: blood return through all ports, free fluid flow and no air  Patient tolerated the procedure well with no immediate complications.

## 2018-05-03 NOTE — Anesthesia Procedure Notes (Signed)
Procedure Name: Intubation Date/Time: 05/03/2018 10:40 AM Performed by: Neldon Newport, CRNA Pre-anesthesia Checklist: Timeout performed, Patient being monitored, Suction available, Emergency Drugs available and Patient identified Patient Re-evaluated:Patient Re-evaluated prior to induction Oxygen Delivery Method: Circle system utilized Preoxygenation: Pre-oxygenation with 100% oxygen Induction Type: IV induction Ventilation: Mask ventilation without difficulty and Oral airway inserted - appropriate to patient size Laryngoscope Size: Mac and 3 Grade View: Grade I Endobronchial tube: Left, Double lumen EBT and EBT position confirmed by fiberoptic bronchoscope and 37 Fr Number of attempts: 1 Placement Confirmation: breath sounds checked- equal and bilateral,  positive ETCO2 and ETT inserted through vocal cords under direct vision Tube secured with: Tape Dental Injury: Teeth and Oropharynx as per pre-operative assessment

## 2018-05-03 NOTE — Op Note (Signed)
NAME: Leslie Boyd, Leslie Boyd MEDICAL RECORD [id number] ACCOUNT [id number] DATE OF BIRTH:Feb 26, 1946 FACILITY: [facility] LOCATION: [facility] PHYSICIAN:Milessa Hogan Chaya Jan, MD  OPERATIVE REPORT  DATE OF PROCEDURE:  05/03/2018  PREOPERATIVE DIAGNOSIS:  Left upper lobe lung nodule. Suspected T1N0 lung cancer  POSTOPERATIVE DIAGNOSIS:  Nonsmall-cell carcinoma, left upper lobe, clinical stage IA(T1N0).  PROCEDURE:   Left video-assisted thoracoscopy, Lingular sparing left upper lobectomy, Mediastinal lymph node dissection, Intercostal nerve block.  SURGEON:  Modesto Charon, MD  ASSISTANT:  Jadene Pierini, PA-C  ANESTHESIA:  General.  FINDINGS:  Frozen section revealed nonsmall-cell carcinoma.  Margin was negative for tumor.  CLINICAL NOTE:  The patient is a 72 year old woman with a history of tobacco abuse who was found to have a left upper lobe nodule on the lung cancer screening CT done in January of 2018.  At 6 months, there was no change.  Later, A PET CT showed  low-level metabolic activity, but she refused surgical referral.  A CT in May showed an increase in size, and she was referred for surgical evaluation.  She now finally presents for surgical resection.  The indications, risks, benefits, and alternatives  were discussed in detail with the patient.  She understood and accepted the risks and agreed to proceed.  OPERATIVE NOTE:  The patient was brought to the preoperative holding area on 05/03/2018.  Anesthesia placed a central line and an arterial blood pressure monitoring line.  She was taken to the operating room, anesthetized and intubated.  Intravenous  antibiotics were administered.  A Foley catheter was placed.  Sequential compression devices were placed on the calves for DVT prophylaxis.  She was placed in a right lateral decubitus position.  The left chest was prepped and draped in the usual sterile  fashion.  Single-lung ventilation of the right lung was initiated and was  tolerated well throughout the procedure.  A timeout was performed.  A solution containing 20 mL of liposomal bupivacaine, 30 mL of 0.5% bupivacaine and 50 mL of saline was prepared.  This was used to locally anesthetize the sites for the incisions, as well as for the intercostal nerve blocks.  An  incision was made in the 7th interspace in the mid axillary line.  A 5 mm port was inserted into the chest.  There was good isolation of the left lung.  The fissure was nearly complete.  There was no pleural effusion and no abnormality of the visceral  or parietal pleura.  A 5 cm working incision was made in the 4th interspace anterolaterally, and a second port incision was made anterior to the first.  The nodule was palpable in the midportion of the left upper lobe along the major fissure.  The  inferior ligament was divided.  All lymph nodes that were encountered during the dissection were removed and sent separately for permanent pathology.  The pleural reflection was divided at the hilum posteriorly initially and then anteriorly.  The  superior pulmonary vein was identified, and the lingular segmental branches were identified.  Those were preserved, and the remainder of the superior pulmonary vein was divided with the endoscopic vascular stapler.  There was an apical pulmonary artery  branch that was dissected out.  While dividing the hilum superiorly, a level 5 node was identified and removed.  The apical arterial branch was encircled and divided with the vascular stapler.  Dissection then was carried into the fissure.  Again, the  fissure was essentially complete.  The lingular arterial branches were identified and preserved.  The dissection was carried along the pulmonary artery posterior to the lingular branch.  The posterior branch was identified.  It was dissected out and then  divided with the endoscopic vascular stapler.  There was a large bifurcating pulmonary arterial branch between those 2 branches  which was carefully dissected out.  This took a considerable period of time given that it was an awkward angle behind the  left upper lobe bronchus.  Ultimately, this was encircled and divided with a vascular stapler.  The parenchymal division then was completed with sequential firings of an Echelon stapler.  A 45 mm stapler was used.  Both gold and green cartridges were  used for the staple lines.  As the bronchus was approached, the stapler was closed across the bronchus.  A test inflation showed good aeration of the lower lobe and the lingula.  The stapler was fired, transecting the bronchus.  There was a greater than  1 cm gross margin on the tumor.  The specimen was placed into an endoscopic retrieval bag, removed and sent for frozen section of the nodule and the margin.  The chest was copiously irrigated with warm saline.  A test inflation to 20 cm of water revealed  no leakage from the bronchial stump or the staple lines.  Intercostal nerve blocks were performed using the bupivacaine solution.  The 3rd through 10th interspaces were blocked using a posterior approach and injecting the solution into a subpleural plane.   Frozen section returned showing nonsmall-cell carcinoma in the nodule.  The margin was clear.  A 28-French chest tube was placed through the anterior port incision and secured with a #1 silk suture.  Dual-lung ventilation was resumed.  The remaining port  incision was closed in standard fashion.  The working incision was closed in 3 layers.  Dermabond was applied to the incisions.  The chest tube was placed to suction.  The patient was placed back in a supine position.  She was extubated in the operating  room and taken to the postanesthetic care unit in good condition.  LN/NUANCE  D:05/03/2018 T:05/03/2018 JOB:[id number]

## 2018-05-03 NOTE — Brief Op Note (Signed)
05/03/2018  1:57 PM  PATIENT:  Leslie Boyd  72 y.o. female  PRE-OPERATIVE DIAGNOSIS:  LUL NODULE  POST-OPERATIVE DIAGNOSIS:  LUL Nodule  PROCEDURE:  Procedure(s): VIDEO ASSISTED THORACOSCOPY (VATS)/WEDGE RESECTION (Left) LEFT UPPER LOBE SEGMENTECTOMY  SURGEON:  Surgeon(s) and Role:    * [name], MD - Primary  PHYSICIAN ASSISTANT: WAYNE GOLD PA-C   ANESTHESIA:   general  EBL:  150 mL   BLOOD ADMINISTERED:none  DRAINS: 1 Chest Tube(s) in the LEFT HEMITHORAX   LOCAL MEDICATIONS USED:  BUPIVICAINE   SPECIMEN:  Source of Specimen:  LUL SEGMENT, MULT LN BX SAMPLES  DISPOSITION OF SPECIMEN:  PATHOLOGY  COUNTS:  YES  TOURNIQUET:  * No tourniquets in log *  DICTATION: .PENDING  PLAN OF CARE: Admit to inpatient   PATIENT DISPOSITION:  PACU - hemodynamically stable.   Delay start of Pharmacological VTE agent (>24hrs) due to surgical blood loss or risk of bleeding: yes  COMPLICATIONS: NO KNOWN

## 2018-05-03 NOTE — Anesthesia Procedure Notes (Signed)
Arterial Line Insertion Start/End10/28/2019 9:30 AM, 05/03/2018 9:45 AM Performed by: Mariea Clonts, CRNA, CRNA  Patient location: Pre-op. Preanesthetic checklist: patient identified, IV checked and site marked Lidocaine 1% used for infiltration Right, radial was placed Catheter size: 20 G Hand hygiene performed  and maximum sterile barriers used   Attempts: 1 Procedure performed without using ultrasound guided technique. Following insertion, dressing applied and Biopatch. Post procedure assessment: normal and unchanged  Patient tolerated the procedure well with no immediate complications.

## 2018-05-04 ENCOUNTER — Encounter (HOSPITAL_COMMUNITY): Payer: Self-pay | Admitting: Thoracic Surgery (Cardiothoracic Vascular Surgery)

## 2018-05-04 ENCOUNTER — Inpatient Hospital Stay (HOSPITAL_COMMUNITY): Payer: Medicare HMO

## 2018-05-04 LAB — BLOOD GAS, ARTERIAL
ACID-BASE EXCESS: 1.9 mmol/L (ref 0.0–2.0)
Bicarbonate: 25.8 mmol/L (ref 20.0–28.0)
DRAWN BY: 418751
FIO2: 21
O2 SAT: 95.9 %
PATIENT TEMPERATURE: 98.6
pCO2 arterial: 39 mmHg (ref 32.0–48.0)
pH, Arterial: 7.435 (ref 7.350–7.450)
pO2, Arterial: 78.1 mmHg — ABNORMAL LOW (ref 83.0–108.0)

## 2018-05-04 LAB — BASIC METABOLIC PANEL
ANION GAP: 11 (ref 5–15)
BUN: 9 mg/dL (ref 8–23)
CALCIUM: 8.8 mg/dL — AB (ref 8.9–10.3)
CO2: 25 mmol/L (ref 22–32)
CREATININE: 0.6 mg/dL (ref 0.44–1.00)
Chloride: 103 mmol/L (ref 98–111)
Glucose, Bld: 130 mg/dL — ABNORMAL HIGH (ref 70–99)
Potassium: 3.8 mmol/L (ref 3.5–5.1)
SODIUM: 139 mmol/L (ref 135–145)

## 2018-05-04 LAB — CBC
HEMATOCRIT: 34 % — AB (ref 36.0–46.0)
Hemoglobin: 10.6 g/dL — ABNORMAL LOW (ref 12.0–15.0)
MCH: 29.6 pg (ref 26.0–34.0)
MCHC: 31.2 g/dL (ref 30.0–36.0)
MCV: 95 fL (ref 80.0–100.0)
NRBC: 0 % (ref 0.0–0.2)
PLATELETS: 307 10*3/uL (ref 150–400)
RBC: 3.58 MIL/uL — AB (ref 3.87–5.11)
RDW: 12.8 % (ref 11.5–15.5)
WBC: 15.9 10*3/uL — AB (ref 4.0–10.5)

## 2018-05-04 MED ORDER — NICOTINE 14 MG/24HR TD PT24
14.0000 mg | MEDICATED_PATCH | Freq: Every day | TRANSDERMAL | Status: DC
  Administered 2018-05-04 – 2018-05-07 (×4): 14 mg via TRANSDERMAL
  Filled 2018-05-04 (×4): qty 1

## 2018-05-04 MED ORDER — ENOXAPARIN SODIUM 30 MG/0.3ML ~~LOC~~ SOLN
30.0000 mg | Freq: Every day | SUBCUTANEOUS | Status: DC
  Administered 2018-05-04 – 2018-05-07 (×4): 30 mg via SUBCUTANEOUS
  Filled 2018-05-04 (×4): qty 0.3

## 2018-05-04 NOTE — Progress Notes (Signed)
1 Day Post-Op Procedure(s) (LRB): VIDEO ASSISTED THORACOSCOPY (VATS)/WEDGE RESECTION (Left) Subjective: No complaints this AM, denies pain and nausea  Objective: Vital signs in last 24 hours: Temp:  [98 F (36.7 C)-98.8 F (37.1 C)] 98.2 F (36.8 C) (10/29 0350) Pulse Rate:  [76-93] 86 (10/29 0716) Cardiac Rhythm: Normal sinus rhythm (10/29 0350) Resp:  [15-26] 18 (10/29 0737) BP: (121-168)/(61-86) 139/72 (10/29 0350) SpO2:  [96 %-100 %] 98 % (10/29 0737) Arterial Line BP: (94-167)/(57-76) 94/57 (10/29 0350)  Hemodynamic parameters for last 24 hours:    Intake/Output from previous day: 10/28 0701 - 10/29 0700 In: 3789 [P.O.:240; I.V.:3349; IV Piggyback:200] Out: 4888 [BVQXI:5038; Blood:150; Chest Tube:160] Intake/Output this shift: No intake/output data recorded.  General appearance: alert, cooperative and no distress Neurologic: intact Heart: regular rate and rhythm Lungs: clear to auscultation bilaterally minimal air leak  Lab Results: Recent Labs    05/03/18 1337 05/04/18 0405  WBC  --  15.9*  HGB 10.2* 10.6*  HCT 30.0* 34.0*  PLT  --  307   BMET:  Recent Labs    05/03/18 1337 05/04/18 0405  NA 140 139  K 3.5 3.8  CL  --  103  CO2  --  25  GLUCOSE  --  130*  BUN  --  9  CREATININE  --  0.60  CALCIUM  --  8.8*    PT/INR: No results for input(s): LABPROT, INR in the last 72 hours. ABG    Component Value Date/Time   PHART 7.435 05/04/2018 0500   HCO3 25.8 05/04/2018 0500   TCO2 30 05/03/2018 1337   ACIDBASEDEF 1.0 05/03/2018 1157   O2SAT 95.9 05/04/2018 0500   CBG (last 3)  No results for input(s): GLUCAP in the last 72 hours.  Assessment/Plan: S/P Procedure(s) (LRB): VIDEO ASSISTED THORACOSCOPY (VATS)/WEDGE RESECTION (Left) -Doing well POD# 1  IS, cough, deep breathe CT to water seal Regular diet IV to KVO SCD + enoxaparin for DVT prophylaxis Nicoderm patch ordered   LOS: 1 day    [name] 05/04/2018

## 2018-05-04 NOTE — Discharge Summary (Addendum)
Physician Discharge Summary       [address]       [place] [address]             [phone]    Patient ID: Leslie Boyd MRN: [id number] DOB/AGE: May 28, 1946 72 y.o.  Admit date: 05/03/2018 Discharge date: 05/07/2018  Admission Diagnoses: Left upper lung nodule  Discharge Diagnoses:  1. Nonsmall-cell carcinoma, left upper lobe, clinical stage IA(T1N0). 2. History of tobacco abuse 3. History of GERD (gastroesophageal reflux disease) 4. History of Neoplasm of uncertain behavior of left upper lobe of lung 5. History substance abuse ([place])  Procedure (s):  Left video-assisted thoracoscopy, lingular sparing left upper lobectomy, mediastinal lymph node dissection, intercostal nerve block by Dr. Roxan Hockey on 05/03/2018.  Pathology:INAL DIAGNOSIS 1. Lung, resection (segmental or lobe), left upper lobe - ADENOCARCINOMA, 1.1 CM. - MARGINS NOT INVOLVED. - TUMOR INVOLVES SUBPLEURAL CONNECTIVE TISSUE (PL1). - ANTHRACOTIC PERIBRONCHIAL LYMPH NODE WITH NO TUMOR IDENTIFIED. 2. Lymph node, biopsy, 9 - ANTHRACOTIC LYMPH NODE. - NO EVIDENCE OF MALIGNANCY. 3. Lymph node, biopsy, 7 - ANTHRACOTIC LYMPH NODE. - NO EVIDENCE OF MALIGNANCY. 4. Lymph node, biopsy, 5 - ANTHRACOTIC LYMPH NODE. - NO EVIDENCE OF MALIGNANCY. 5. Lymph node, biopsy, 12 - ANTHRACOTIC LYMPH NODE. - NO EVIDENCE OF MALIGNANCY. 6. Lymph node, biopsy, 10 - ANTHRACOTIC LYMPH NODE. - NO EVIDENCE OF MALIGNANCY.  History of Presenting Illness: Leslie Boyd is a 72 year old woman with a history of tobacco abuse (50 pack years), hypertension, hyperlipidemia, anxiety, reflux, hyperlipidemia, and coronary calcification on CT.  She started smoking at age 58.  She is still smoking 3 or 4 cigarettes a day.  She had a lung cancer screening study done in January 2018.  She had a 13 mm mixed density nodule in the posterior left upper lobe.  There was a 5 mm solid component.  There is no mediastinal or hilar  adenopathy.  Short-term CT 6 months later showed no change.  She had a PET CT in September 2018 which showed low-level metabolic activity with an SUV of 1.0.  She refused surgical referral at that time.  A CT in May 2019 showed a slight increase in size from her initial scan.  She was referred for surgical evaluation.  Dr. Roxan Hockey saw her in June.  Dr. Roxan Hockey recommended we do a left VATS for wedge resection and possible lingular sparing left upper lobectomy.  She said that Dr. Atilano Median who told her she needed cardiology clearance prior to any surgery and she went to see him in July.  He did a stress echo which apparently showed some EKG changes but no echocardiographic changes.  She was told she was okay to proceed with surgery.  In the meantime, she developed left lower quadrant pain with nausea and vomiting.  She went to the emergency room.  She was found to have a left hydronephrosis.  The story becomes quite confusing thereafter.  She says she was told she did not have cancer and then told that she might have it.  She was scheduled for a stent but decided to seek a second opinion.  She has an appointment with [facility] on 04/19/2018.  Dr. Roxan Hockey discussed with Leslie Boyd the importance of dealing with his lung nodule at this point rather than waiting for it to potentially grow or spread.  Dr.Idy Rawling recommended that we proceed with left VATS for wedge resection and possible lingular sparing left upper lobectomy.  Is unlikely to be necessary but she  does have adequate lung function to tolerate a lobectomy if needed.  Dr. Roxan Hockey discussed the general nature of the procedure with her again.  Dr. Roxan Hockey discussed the need for general anesthesia, the incision to be used, the use of a drainage tube postoperatively, the expected hospital stay, and the overall recovery.  Dr. Roxan Hockey again reviewed the indications, risks, benefits, alternatives. Patient underwent  a left  video-assisted thoracoscopy, lingular sparing left upper lobectomy, mediastinal lymph node dissection, intercostal nerve block on 05/03/2018.  Brief Hospital Course:  The patient remained afebrile and hemodynamically stable. A line and foley were removed early in the post operative course. Chest tube output gradually decreased. Daily chest x rays were obtained and remained stable. Chest tube was placed to water seal on 10/29. There was question of a small air leak so chest tube remained. Air leak did resolve and chest tube was removed on 05/06/2018. Patient is ambulating on room air. Patient is tolerating a diet and has had a bowel movement. Wounds are clean and dry. Final chest X ray showed small bilateral pleural effusions, residual left pneumothorax, and left base scarring or atelectasis . Patient is felt surgically stable for discharge today.   Latest Vital Signs: Blood pressure 121/87, pulse 92, temperature 99.1 F (37.3 C), temperature source Oral, resp. rate (!) 22, SpO2 94 %.  Physical Exam: Cardiovascular: RRR Pulmonary: Clear to auscultation bilaterally Abdomen: Soft, non tender, bowel sounds present. Extremities: No lower  extremity edema. Wounds: Clean and dry.  No erythema or signs of infection. Discharge Condition: Stable and discharged to home.  Recent laboratory studies:  Lab Results  Component Value Date   WBC 14.5 (H) 05/05/2018   HGB 10.0 (L) 05/05/2018   HCT 32.9 (L) 05/05/2018   MCV 95.9 05/05/2018   PLT 297 05/05/2018   Lab Results  Component Value Date   NA 141 05/05/2018   K 3.4 (L) 05/05/2018   CL 108 05/05/2018   CO2 27 05/05/2018   CREATININE 0.48 05/05/2018   GLUCOSE 104 (H) 05/05/2018      Diagnostic Studies: Dg Chest 2 View  Result Date: 05/07/2018 CLINICAL DATA:  Left chest tube EXAM: CHEST - 2 VIEW COMPARISON:  05/06/2018 FINDINGS: There is hyperinflation of the lungs compatible with COPD. Heart is borderline in size. Small bilateral effusions.  On the lateral view, there is an air-fluid level centrally which may be within the left major fissure. There appears to be a pleural lining anteriorly on the lateral view suggesting residual left pneumothorax. Left base atelectasis or scarring. IMPRESSION: COPD.  Residual left pneumothorax best seen on the lateral view. Small bilateral effusions. Left base scarring or atelectasis. Electronically Signed   By: Rolm Baptise M.D.   On: 05/07/2018 08:58   Dg Chest 2 View  Result Date: 05/03/2018 CLINICAL DATA:  Preop lung surgery.  Left lung nodule. EXAM: CHEST - 2 VIEW COMPARISON:  Chest CT 04/05/2018 FINDINGS: Nodule projects over the left upper lobe as seen on prior chest CT. Hyperinflation. Biapical scarring. Heart is normal size. No effusions or acute bony abnormality. IMPRESSION: Hyperinflation.  Left upper lobe nodule.  Biapical scarring. Electronically Signed   By: Rolm Baptise M.D.   On: 05/03/2018 08:39   Dg Chest 1v Repeat Same Day  Result Date: 05/06/2018 CLINICAL DATA:  Chest tube removal. EXAM: CHEST - 1 VIEW SAME DAY COMPARISON:  05/06/2018. FINDINGS: Interim left chest tube removal. Previously identified tiny left apical pneumothorax has diminished in size. Right IJ line stable position.  Heart size scratched it stable cardiomegaly. Postsurgical changes left lung. No pleural effusion or pneumothorax. Mild right base atelectasis/infiltrate. IMPRESSION: 1. Left chest tube removal. Previously identified tiny left apical pneumothorax has diminished in size. Right IJ line stable position. 2. Postsurgical changes left lung. Mild bibasilar atelectasis and small left pleural effusion. Electronically Signed   By: Marcello Moores  Register   On: 05/06/2018 10:24   Dg Chest Port 1 View  Result Date: 05/06/2018 CLINICAL DATA:  Follow-up pneumothorax EXAM: PORTABLE CHEST 1 VIEW COMPARISON:  05/05/2018 FINDINGS: Cardiac shadow is stable. Right jugular central line and left-sided thoracostomy catheter are again  noted and stable. Lungs are well aerated bilaterally. Some minimal basilar atelectasis is seen. Tiny left apical pneumothorax is noted better visualized than on the prior exam. Left ureteral stent is noted. No other focal abnormality is seen. Postsurgical changes are again noted on the left. IMPRESSION: Mild bibasilar atelectasis. Tiny left apical pneumothorax better visualized than on the previous exam. Electronically Signed   By: Inez Catalina M.D.   On: 05/06/2018 09:01   Dg Chest Port 1 View  Result Date: 05/05/2018 CLINICAL DATA:  Status post left upper lobectomy. Chest tube in place. EXAM: PORTABLE CHEST 1 VIEW COMPARISON:  May 03, 2018 and May 04, 2018 FINDINGS: Postoperative change on the left. Chest tube noted on the left with tip in the left apical region. No pneumothorax. Central catheter tip is at the cavoatrial junction. There is atelectasis in the left base. Lungs elsewhere clear. No edema or consolidation. Heart size and pulmonary vascularity are normal. No adenopathy. No bone lesions. IMPRESSION: Postoperative change on the left. Left base atelectasis. No pneumothorax. Tube and catheter positions as described. Stable cardiac silhouette. Electronically Signed   By: Lowella Grip III M.D.   On: 05/05/2018 10:46   Dg Chest Port 1 View  Result Date: 05/04/2018 CLINICAL DATA:  Status post left VATS EXAM: PORTABLE CHEST 1 VIEW COMPARISON:  05/03/2018 FINDINGS: Right jugular central line and left thoracostomy catheter are again identified and stable. No pneumothorax is seen. The lungs are well aerated. Postsurgical changes are noted on the left. No bony abnormality is seen. IMPRESSION: Tubes and lines as described without acute abnormality. Electronically Signed   By: Inez Catalina M.D.   On: 05/04/2018 09:38   Dg Chest Port 1 View  Result Date: 05/03/2018 CLINICAL DATA:  Status post VATS.  Assess support devices. EXAM: PORTABLE CHEST 1 VIEW COMPARISON:  Preoperative study of  May 03, 2018 FINDINGS: The lungs remain mildly hyperinflated. There is no pneumothorax or pleural effusion. There is no mediastinal shift. The left chest tube tip projects over the fourth-fifth rib interspace posteriorly. The heart and pulmonary vascularity are normal. The internal jugular venous catheter tip projects over the midportion of the SVC. IMPRESSION: There is no postprocedure complication following the VATS for a left lung nodule. Electronically Signed   By: David  Martinique M.D.   On: 05/03/2018 14:46     Discharge Medications: Allergies as of 05/07/2018      Reactions   Zofran [ondansetron Hcl] Other (See Comments)   Excessive sweating   Morphine And Related Nausea And Vomiting, Rash      Medication List    TAKE these medications   amLODipine 5 MG tablet Commonly known as:  NORVASC Take 5 mg by mouth daily.   HYDROcodone-acetaminophen 5-325 MG tablet Commonly known as:  NORCO/VICODIN Take one table every 4-6 hours PRN severe pain What changed:    how much to  take  how to take this  when to take this  reasons to take this  additional instructions   nicotine 14 mg/24hr patch Commonly known as:  NICODERM CQ - dosed in mg/24 hours Place 1 patch (14 mg total) onto the skin daily.   oxybutynin 5 MG tablet Commonly known as:  DITROPAN Take 1 tablet (5 mg total) by mouth every 8 (eight) hours as needed for bladder spasms.   phenazopyridine 200 MG tablet Commonly known as:  PYRIDIUM Take 1 tablet (200 mg total) by mouth 3 (three) times daily as needed (for pain with urination).   pravastatin 20 MG tablet Commonly known as:  PRAVACHOL Take 20 mg by mouth daily.   promethazine 12.5 MG tablet Commonly known as:  PHENERGAN Take 1 tablet (12.5 mg total) by mouth every 6 (six) hours as needed for nausea or vomiting.   ranitidine 150 MG tablet Commonly known as:  ZANTAC Take 150 mg by mouth 2 (two) times daily.   sodium chloride 0.65 % Soln nasal spray Commonly  known as:  OCEAN Place 1 spray into both nostrils 4 (four) times daily as needed for congestion.       Follow Up Appointments: Follow-up Information    [name], MD. Go on 06/01/2018.   Specialty:  Cardiothoracic Surgery Why:  PA/LAT CXR to be taken (at [facility] which is in the same building as Dr. Leonarda Salon office) on 06/01/2018 at 9:15 am;Appointment time is at 9:45 am Contact information: [address] [place] [address] [phone]        Nurse. Go on 05/19/2018.   Why:  Appointment is with nurse only to have chest tube sutures removed. Appointment time is at 10:00 am Contact information: [place] [address]          Signed: Sharalyn Ink [facility] 05/07/2018, 12:53 PM

## 2018-05-04 NOTE — Discharge Instructions (Signed)
Thoracoscopy, Care After °Refer to this sheet in the next few weeks. These instructions provide you with information about caring for yourself after your procedure. Your health care provider may also give you more specific instructions. Your treatment has been planned according to current medical practices, but problems sometimes occur. Call your health care provider if you have any problems or questions after your procedure. °What can I expect after the procedure? °After your procedure, it is common to feel sore for up to two weeks. °Follow these instructions at home: °· There are many different ways to close and cover an incision, including stitches (sutures), skin glue, and adhesive strips. Follow your health care provider's instructions about: °? Incision care. °? Bandage (dressing) changes and removal. °? Incision closure removal. °· Check your incision area every day for signs of infection. Watch for: °? Redness, swelling, or pain. °? Fluid, blood, or pus. °· Take medicines only as directed by your health care provider. °· Try to cough often. Coughing helps to protect against lung infection (pneumonia). It may hurt to cough. If this happens, hold a pillow against your chest when you cough. °· Take deep breaths. This also helps to protect against pneumonia. °· If you were given an incentive spirometer, use it as directed by your health care provider. °· Do not take baths, swim, or use a hot tub until your health care provider approves. You may take showers. °· Avoid lifting until your health care provider approves. °· Avoid driving until your health care provider approves. °· Do not travel by airplane after the chest tube is removed until your health care provider approves. °Contact a health care provider if: °· You have a fever. °· Pain medicines do not ease your pain. °· You have redness, swelling, or increasing pain in your incision area. °· You develop a cough that does not go away, or you are coughing up  mucus that is yellow or green. °Get help right away if: °· You have fluid, blood, or pus coming from your incision. °· There is a bad smell coming from your incision or dressing. °· You develop a rash. °· You have difficulty breathing. °· You cough up blood. °· You develop light-headedness or you feel faint. °· You develop chest pain. °· Your heartbeat feels irregular or very fast. °This information is not intended to replace advice given to you by your health care provider. Make sure you discuss any questions you have with your health care provider. °Document Released: 01/10/2005 Document Revised: 02/24/2016 Document Reviewed: 03/08/2014 °Elsevier Interactive Patient Education © 2018 Elsevier Inc. ° °

## 2018-05-05 ENCOUNTER — Inpatient Hospital Stay (HOSPITAL_COMMUNITY): Payer: Medicare HMO

## 2018-05-05 LAB — COMPREHENSIVE METABOLIC PANEL
ALK PHOS: 47 U/L (ref 38–126)
ALT: 12 U/L (ref 0–44)
AST: 20 U/L (ref 15–41)
Albumin: 3 g/dL — ABNORMAL LOW (ref 3.5–5.0)
Anion gap: 6 (ref 5–15)
BUN: 9 mg/dL (ref 8–23)
CALCIUM: 8.6 mg/dL — AB (ref 8.9–10.3)
CO2: 27 mmol/L (ref 22–32)
CREATININE: 0.48 mg/dL (ref 0.44–1.00)
Chloride: 108 mmol/L (ref 98–111)
GFR calc non Af Amer: >60 mL/min (ref >60)
GLUCOSE: 104 mg/dL — AB (ref 70–99)
Potassium: 3.4 mmol/L — ABNORMAL LOW (ref 3.5–5.1)
SODIUM: 141 mmol/L (ref 135–145)
Total Bilirubin: 0.3 mg/dL (ref 0.3–1.2)
Total Protein: 5.1 g/dL — ABNORMAL LOW (ref 6.5–8.1)

## 2018-05-05 LAB — CBC
HEMATOCRIT: 32.9 % — AB (ref 36.0–46.0)
HEMOGLOBIN: 10 g/dL — AB (ref 12.0–15.0)
MCH: 29.2 pg (ref 26.0–34.0)
MCHC: 30.4 g/dL (ref 30.0–36.0)
MCV: 95.9 fL (ref 80.0–100.0)
Platelets: 297 10*3/uL (ref 150–400)
RBC: 3.43 MIL/uL — AB (ref 3.87–5.11)
RDW: 13.1 % (ref 11.5–15.5)
WBC: 14.5 10*3/uL — ABNORMAL HIGH (ref 4.0–10.5)
nRBC: 0 % (ref 0.0–0.2)

## 2018-05-05 MED ORDER — METOPROLOL TARTRATE 5 MG/5ML IV SOLN
INTRAVENOUS | Status: AC
  Filled 2018-05-05: qty 5

## 2018-05-05 MED ORDER — POTASSIUM CHLORIDE CRYS ER 20 MEQ PO TBCR
40.0000 meq | EXTENDED_RELEASE_TABLET | Freq: Two times a day (BID) | ORAL | Status: AC
  Administered 2018-05-05 (×2): 40 meq via ORAL
  Filled 2018-05-05 (×2): qty 2

## 2018-05-05 MED ORDER — POTASSIUM CHLORIDE CRYS ER 20 MEQ PO TBCR: 40.0000 meq | EXTENDED_RELEASE_TABLET | Freq: Two times a day (BID) | ORAL | Status: DC

## 2018-05-05 MED ORDER — FOLIC ACID 5 MG/ML IJ SOLN
1.0000 mg | Freq: Every day | INTRAMUSCULAR | Status: DC
  Administered 2018-05-06: 1 mg via INTRAVENOUS
  Filled 2018-05-05 (×5): qty 0.2

## 2018-05-05 MED ORDER — VITAMIN B-1 100 MG PO TABS
100.0000 mg | ORAL_TABLET | Freq: Every day | ORAL | Status: DC
  Administered 2018-05-05 – 2018-05-07 (×3): 100 mg via ORAL
  Filled 2018-05-05 (×3): qty 1

## 2018-05-05 NOTE — Progress Notes (Addendum)
      [address]       [place] [address]             [phone]       2 Days Post-Op Procedure(s) (LRB): VIDEO ASSISTED THORACOSCOPY (VATS)/WEDGE RESECTION (Left)  Subjective: Patient without specific complaints this am.  Objective: Vital signs in last 24 hours: Temp:  [98.3 F (36.8 C)-98.9 F (37.2 C)] 98.7 F (37.1 C) (10/30 0402) Pulse Rate:  [80-100] 87 (10/30 0402) Cardiac Rhythm: Normal sinus rhythm (10/29 2340) Resp:  [19-31] 31 (10/30 0402) BP: (116-138)/(56-73) 135/65 (10/30 0402) SpO2:  [96 %-100 %] 97 % (10/30 0402)     Intake/Output from previous day: 10/29 0701 - 10/30 0700 In: 1178.3 [P.O.:480; I.V.:698.3] Out: 820 [Urine:600; Chest Tube:220]   Physical Exam:  Cardiovascular: RRR Pulmonary: Clear to auscultation bilaterally Abdomen: Soft, non tender, bowel sounds present. Extremities: No lower  extremity edema. Wounds: Clean and dry.  No erythema or signs of infection. Chest Tube: to water seal, no air leak  Lab Results: CBC: Recent Labs    05/04/18 0405 05/05/18 0530  WBC 15.9* 14.5*  HGB 10.6* 10.0*  HCT 34.0* 32.9*  PLT 307 297   BMET:  Recent Labs    05/04/18 0405 05/05/18 0530  NA 139 141  K 3.8 3.4*  CL 103 108  CO2 25 27  GLUCOSE 130* 104*  BUN 9 9  CREATININE 0.60 0.48  CALCIUM 8.8* 8.6*    PT/INR: No results for input(s): LABPROT, INR in the last 72 hours. ABG:  INR: Will add last result for INR, ABG once components are confirmed Will add last 4 CBG results once components are confirmed  Assessment/Plan:  1. CV - SR in the 80's this am. 2.  Pulmonary - Chest tube output 220 cc last 24 hours. Chest tube is to water seal. There is no air leak. CXR this am appears stable (no pneumothorax). Hope to remove chest tube.Encourage incentive spirometer. Await final pathology. 3. Nurse reported daughter states patient drinks a fair amount. Will start thiamine and monitor. 4. Supplement potassium 5.  Anemia-H and H 10 and 32.9 this am. 6. Stop PCA and remove central line if chest tube removed  Donielle M ZimmermanPA-C 05/05/2018,7:40 AM [phone]  Patient seen and examined, agree with above Path T1N0 adenocarcinoma- stage IA Has some air rattling in tube, possibly small air leak- keep tube in today, reevaluate in AM Ambulate  Remo Lipps C. Roxan Hockey, MD [facility] [phone]

## 2018-05-05 NOTE — Care Management Note (Signed)
Case Management Note  Patient Details  Name: Leslie Boyd MRN: [id number] Date of Birth: 05/02/1946  Subjective/Objective:   Pt is s/p VATS                 Action/Plan:  PTA from home, has PCP and active insurance.  Pt independently walking around the unit.  CM will continue to follow for discharge needs   Expected Discharge Date:                  Expected Discharge Plan:     In-House Referral:     Discharge planning Services  CM Consult  Post Acute Care Choice:    Choice offered to:     DME Arranged:    DME Agency:     HH Arranged:    HH Agency:     Status of Service:     If discussed at [employer], dates discussed:    Additional Comments:  Maryclare Labrador, RN 05/05/2018, 11:19 AM

## 2018-05-06 ENCOUNTER — Inpatient Hospital Stay (HOSPITAL_COMMUNITY): Payer: Medicare HMO

## 2018-05-06 MED ORDER — FENTANYL 40 MCG/ML IV SOLN
INTRAVENOUS | Status: DC
  Filled 2018-05-06: qty 25

## 2018-05-06 NOTE — Progress Notes (Signed)
Chest tube discontinued without complication. 23mL Fentanyl PCA wasted, witnessed by [employer]

## 2018-05-06 NOTE — Progress Notes (Addendum)
      [address]       [place] [address]             [phone]       3 Days Post-Op Procedure(s) (LRB): VIDEO ASSISTED THORACOSCOPY (VATS)/WEDGE RESECTION (Left)  Subjective: Patient without specific complaints this am.  Objective: Vital signs in last 24 hours: Temp:  [98 F (36.7 C)-99.5 F (37.5 C)] 98.6 F (37 C) (10/31 0342) Pulse Rate:  [90-101] 96 (10/31 0342) Cardiac Rhythm: Sinus tachycardia (10/31 0700) Resp:  [19-33] 28 (10/31 0342) BP: (118-137)/(62-76) 137/76 (10/31 0342) SpO2:  [94 %-98 %] 96 % (10/31 0342)     Intake/Output from previous day: 10/30 0701 - 10/31 0700 In: 462.6 [P.O.:240; I.V.:222.6] Out: 16 [Chest Tube:70]   Physical Exam:  Cardiovascular: RRR Pulmonary: Clear to auscultation bilaterally Abdomen: Soft, non tender, bowel sounds present. Extremities: No lower  extremity edema. Wounds: Clean and dry.  No erythema or signs of infection. Chest Tube: to water seal, no air leak  Lab Results: CBC: Recent Labs    05/04/18 0405 05/05/18 0530  WBC 15.9* 14.5*  HGB 10.6* 10.0*  HCT 34.0* 32.9*  PLT 307 297   BMET:  Recent Labs    05/04/18 0405 05/05/18 0530  NA 139 141  K 3.8 3.4*  CL 103 108  CO2 25 27  GLUCOSE 130* 104*  BUN 9 9  CREATININE 0.60 0.48  CALCIUM 8.8* 8.6*    PT/INR: No results for input(s): LABPROT, INR in the last 72 hours. ABG:  INR: Will add last result for INR, ABG once components are confirmed Will add last 4 CBG results once components are confirmed  Assessment/Plan:  1. CV - Slightly tachycardic at 100 this am. On Amlodipine 5 mg daily as taken prior to surgery. 2.  Pulmonary - Chest tube output 220 cc last 24 hours. Chest tube is to water seal. There is no air leak. CXR this am appears stable. Hope to remove chest tube.Encourage incentive spirometer.  3. Anemia-Last H and H 10 and 32.9 4. Stop PCA and remove central line if chest tube removed  Donielle M  ZimmermanPA-C 05/06/2018,7:41 AM [phone] Patient seen and examined, agree with above Dc chest tube Dc PCA Increase ambulation  Remo Lipps C. Roxan Hockey, MD [facility] [phone]

## 2018-05-06 NOTE — Care Management Important Message (Signed)
Important Message  Patient Details  Name: Leslie Boyd MRN: [id number] Date of Birth: 01-23-1946   Medicare Important Message Given:  Yes    [name] [name] 05/06/2018, 4:08 PM

## 2018-05-06 NOTE — Progress Notes (Signed)
Floor RN to DC central line

## 2018-05-07 ENCOUNTER — Inpatient Hospital Stay (HOSPITAL_COMMUNITY): Payer: Medicare HMO

## 2018-05-07 DIAGNOSIS — C3492 Malignant neoplasm of unspecified part of left bronchus or lung: Secondary | ICD-10-CM

## 2018-05-07 MED ORDER — HYDROCODONE-ACETAMINOPHEN 5-325 MG PO TABS: ORAL_TABLET | ORAL | 0 refills | Status: DC

## 2018-05-07 MED ORDER — NICOTINE 14 MG/24HR TD PT24: 14.0000 mg | MEDICATED_PATCH | Freq: Every day | TRANSDERMAL | 0 refills | Status: DC

## 2018-05-07 NOTE — Progress Notes (Addendum)
      [address]       [place] [address]             [phone]       4 Days Post-Op Procedure(s) (LRB): VIDEO ASSISTED THORACOSCOPY (VATS)/WEDGE RESECTION (Left)  Subjective: Patient has a little discomfort at wound site.  Objective: Vital signs in last 24 hours: Temp:  [98.1 F (36.7 C)-100 F (37.8 C)] 99 F (37.2 C) (11/01 0421) Pulse Rate:  [91-101] 97 (11/01 0421) Cardiac Rhythm: Normal sinus rhythm (11/01 0000) Resp:  [18-28] 20 (11/01 0421) BP: (117-132)/(63-82) 132/82 (11/01 0421) SpO2:  [95 %-100 %] 96 % (11/01 0421)     Intake/Output from previous day: 10/31 0701 - 11/01 0700 In: 480 [P.O.:480] Out: 0    Physical Exam:  Cardiovascular: RRR Pulmonary: Clear to auscultation bilaterally Abdomen: Soft, non tender, bowel sounds present. Extremities: No lower  extremity edema. Wounds: Clean and dry.  No erythema or signs of infection.   Lab Results: CBC: Recent Labs    05/05/18 0530  WBC 14.5*  HGB 10.0*  HCT 32.9*  PLT 297   BMET:  Recent Labs    05/05/18 0530  NA 141  K 3.4*  CL 108  CO2 27  GLUCOSE 104*  BUN 9  CREATININE 0.48  CALCIUM 8.6*    PT/INR: No results for input(s): LABPROT, INR in the last 72 hours. ABG:  INR: Will add last result for INR, ABG once components are confirmed Will add last 4 CBG results once components are confirmed  Assessment/Plan:  1. CV - Slightly tachycardic at 100 this am. On Amlodipine 5 mg daily as taken prior to surgery. 2.  Pulmonary - Chest tube removed yesterday. CXR this am appears stable. Encourage incentive spirometer.  3. Anemia-Last H and H 10 and 32.9 4.  Hope to discharge later today  Sharalyn Ink [facility] 05/07/2018,7:26 AM [phone] Patient seen and examined, agree with above Looks good Home later today  Remo Lipps C. Roxan Hockey, MD [facility] [phone]

## 2018-05-27 ENCOUNTER — Telehealth: Payer: Self-pay

## 2018-05-27 ENCOUNTER — Other Ambulatory Visit: Payer: Self-pay

## 2018-05-27 MED ORDER — HYDROCODONE-ACETAMINOPHEN 5-325 MG PO TABS: ORAL_TABLET | ORAL | 0 refills | Status: DC

## 2018-05-27 NOTE — Telephone Encounter (Signed)
-----   Message from [name], MD sent at 05/27/2018 11:56 AM EST ----- Fine, give her 4  SCH ----- Message ----- From: Donnella Sham, RN Sent: 05/27/2018   9:21 AM EST To: [name], MD  Patient is requesting a refill of Norco.  S/p VATS/ lobe. Dc'd 05/07/18. If ok I will print a script and leave it at the computer when you are in the office today.  Please specify quantity if so.  Thanks,  Caryl Pina

## 2018-05-27 NOTE — Telephone Encounter (Signed)
Ms. Brashears was made aware that her script is ready to pick up when convenient.

## 2018-06-01 ENCOUNTER — Ambulatory Visit: Payer: Medicare HMO | Admitting: Thoracic Surgery (Cardiothoracic Vascular Surgery)

## 2018-06-08 ENCOUNTER — Other Ambulatory Visit: Payer: Self-pay | Admitting: Thoracic Surgery (Cardiothoracic Vascular Surgery)

## 2018-06-08 ENCOUNTER — Ambulatory Visit (INDEPENDENT_AMBULATORY_CARE_PROVIDER_SITE_OTHER): Payer: Self-pay | Admitting: Surgical

## 2018-06-08 ENCOUNTER — Ambulatory Visit
Admission: RE | Admit: 2018-06-08 | Discharge: 2018-06-08 | Disposition: A | Discharge status: Home / Self Care | Payer: Medicare HMO | Source: Ambulatory Visit | Attending: Thoracic Surgery (Cardiothoracic Vascular Surgery) | Admitting: Thoracic Surgery (Cardiothoracic Vascular Surgery)

## 2018-06-08 VITALS — BP 112/64 | HR 92 | Resp 20 | Ht 61.0 in | Wt 88.0 lb

## 2018-06-08 DIAGNOSIS — C3492 Malignant neoplasm of unspecified part of left bronchus or lung: Secondary | ICD-10-CM

## 2018-06-08 NOTE — Patient Instructions (Signed)
Continue smoking cessation Resume routine activities

## 2018-06-08 NOTE — Progress Notes (Signed)
[address]       [place] [address]             [phone]      Savita S Heater Cantrall Medical Record #[id number] Date of Birth: [birth year]  Referring: Nicola Girt, DO Primary Care: Nicola Girt, DO Primary Cardiologist: No primary care provider on file.   Chief Complaint:   POST OP FOLLOW UP OPERATIVE REPORT  DATE OF PROCEDURE:  05/03/2018  PREOPERATIVE DIAGNOSIS:  Left upper lobe lung nodule. Suspected T1N0 lung cancer  POSTOPERATIVE DIAGNOSIS:  Nonsmall-cell carcinoma, left upper lobe, clinical stage IA(T1N0).  PROCEDURE:   Left video-assisted thoracoscopy, Lingular sparing left upper lobectomy, Mediastinal lymph node dissection, Intercostal nerve block.  SURGEON:  Modesto Charon, MD  ASSISTANT:  Jadene Pierini, PA-C  ANESTHESIA:  General.  FINDINGS:  Frozen section revealed nonsmall-cell carcinoma.  Margin was negative for tumor.  History of Present Illness:    Patient is a 71 year old female status post the above described procedure seen in the office on today's date and routine postsurgical follow-up.  She is doing quite well.  He denies any significant symptoms.  She has not resumed smoking.  Is not had any fevers, chills or other constitutional symptoms.  She has regained most of her strength.  She denies shortness of breath.  She is no longer requiring any pain medication.  He is anxious to get back to her garden and resume normal activities.      Past Medical History:  Diagnosis Date  . Anxiety   . Arthritis   . Coronary artery calcification seen on CT scan    cardiologist-- dr Atilano Median [facility] in [facility])  . GERD (gastroesophageal reflux disease)   . History of kidney stones    ureter stent in place  . History of stress test    stress echo 01-29-2018 with dr Atilano Median, cardiologist with [facility] in [facility]  . Hypertension   . Neoplasm of uncertain behavior of left upper lobe of lung    followed by pulmologist--  dr Duane Boston [facility] in [facility])  . Osteopenia   . Renal lesion    left renal pelvis     Social History   Tobacco Use  Smoking Status Current Every Day Smoker  . Packs/day: 1.00  . Years: 53.00  . Pack years: 53.00  . Types: Cigarettes  Smokeless Tobacco Never Used  Tobacco Comment   smokes 4 cigarettes a day    Social History   Substance and Sexual Activity  Alcohol Use Yes   Comment: occ     Allergies  Allergen Reactions  . Zofran [Ondansetron Hcl] Other (See Comments)    Excessive sweating  . Morphine And Related Nausea And Vomiting and Rash    Current Outpatient Medications  Medication Sig Dispense Refill  . amLODipine (NORVASC) 5 MG tablet Take 5 mg by mouth daily.    Marland Kitchen HYDROcodone-acetaminophen (NORCO) 5-325 MG tablet Take one table every 4-6 hours PRN severe pain 25 tablet 0  . nicotine (NICODERM CQ - DOSED IN MG/24 HOURS) 14 mg/24hr patch Place 1 patch (14 mg total) onto the skin daily. 28 patch 0  . oxybutynin (DITROPAN) 5 MG tablet Take 1 tablet (5 mg total) by mouth every 8 (eight) hours as needed for bladder spasms. 30 tablet 1  . pravastatin (PRAVACHOL) 20 MG tablet Take 20 mg by mouth daily.    . promethazine (PHENERGAN) 12.5 MG tablet Take 1 tablet (12.5  mg total) by mouth every 6 (six) hours as needed for nausea or vomiting. 10 tablet 0  . ranitidine (ZANTAC) 150 MG tablet Take 150 mg by mouth 2 (two) times daily.    . sodium chloride (OCEAN) 0.65 % SOLN nasal spray Place 1 spray into both nostrils 4 (four) times daily as needed for congestion.     No current facility-administered medications for this visit.        Physical Exam: BP 112/64   Pulse 92   Resp 20   Ht 5\' 1"  (1.549 m)   Wt 39.9 kg   SpO2 96% Comment: RA  BMI 16.63 kg/m   General appearance: alert, cooperative and no distress Heart: regular rate and rhythm Lungs: clear to auscultation bilaterally Abdomen: Benign exam with Extremities: No edema or calf tenderness Wound: Incision  healing well without evidence of infection   Diagnostic Studies & Laboratory data:     Recent Radiology Findings:   Dg Chest 2 View  Result Date: 06/08/2018 CLINICAL DATA:  Adenocarcinoma left lung. EXAM: CHEST - 2 VIEW COMPARISON:  05/07/2018.  05/06/2018.  CT 04/05/2018. FINDINGS: Patient is slightly rotated to the left. Mediastinum structures are normal. Postsurgical changes left lung. Cardiomegaly with normal pulmonary vascularity. Mild bibasilar atelectasis and pleural thickening. No pneumothorax. No pneumothorax. Prominent degenerative changes are again noted about the thoracic spine with prominent osteophytes noted particularly in the right mid thoracic spine the level of the right cardiophrenic angle. Similar findings noted on prior studies. No acute bony abnormality. IMPRESSION: Postsurgical changes left lung. Mild bibasilar atelectasis and pleural thickening. Electronically Signed   By: Marcello Moores  Register   On: 06/08/2018 15:52      Recent Lab Findings: Lab Results  Component Value Date   WBC 14.5 (H) 05/05/2018   HGB 10.0 (L) 05/05/2018   HCT 32.9 (L) 05/05/2018   PLT 297 05/05/2018   GLUCOSE 104 (H) 05/05/2018   ALT 12 05/05/2018   AST 20 05/05/2018   NA 141 05/05/2018   K 3.4 (L) 05/05/2018   CL 108 05/05/2018   CREATININE 0.48 05/05/2018   BUN 9 05/05/2018   CO2 27 05/05/2018   INR 0.99 04/29/2018      Assessment / Plan: Patient is doing quite well.  She is a stage Ib adenocarcinoma.  We will refer her to [facility] but it appears she will not require any adjuvant therapies at this time.  She will be established with Dr. Earlie Server for long-term follow-up.  Dr. Roxan Hockey wishes to see the patient after January of this year.  Her chest x-ray was reviewed and is as described above.  She may resume normal routine activities.  I have made no changes to her medications.  I encouraged her on long-term lifestyle modifications and she appears to be doing well in her smoking cessation  thus far.          John Giovanni, PA-C 06/08/2018 4:18 PM

## 2018-06-10 ENCOUNTER — Telehealth: Payer: Self-pay | Admitting: *Deleted

## 2018-06-10 ENCOUNTER — Encounter: Payer: Self-pay | Admitting: *Deleted

## 2018-06-10 NOTE — Progress Notes (Signed)
Oncology Nurse Navigator Documentation  Oncology Nurse Navigator Flowsheets 06/10/2018  Navigator Location [facility]  Navigator Encounter Type Telephone/Ms. Cuddeback called me back and stated she needs to wait until after the new year to be scheduled to see Dr. Julien Nordmann.  She states she will call me when she is ready to be scheduled. I updated Dr. Roxan Hockey and his office.   Telephone Incoming Call  Treatment Phase Abnormal Scans  Barriers/Navigation Needs Education;Coordination of Care  Education Other  Interventions Coordination of Care;Education  Coordination of Care Other  Education Method Verbal  Acuity Level 2  Time Spent with Patient 30

## 2018-06-10 NOTE — Telephone Encounter (Signed)
Oncology Nurse Navigator Documentation  Oncology Nurse Navigator Flowsheets 06/10/2018  Navigator Location [facility]  Referral date to RadOnc/MedOnc 06/08/2018  Navigator Encounter Type Telephone/I received referral from Dr. Roxan Hockey.  I called patient to schedule her an appt to be seen at thoracic clinic.  I was unable to reach but did leave vm message with my name and phone number to call.   Telephone Outgoing Call  Treatment Phase Pre-Tx/Tx Discussion  Barriers/Navigation Needs Education;Coordination of Care  Education Other  Interventions Coordination of Care;Education  Coordination of Care Other  Education Method Verbal  Acuity Level 1  Time Spent with Patient 15

## 2018-07-13 ENCOUNTER — Ambulatory Visit (INDEPENDENT_AMBULATORY_CARE_PROVIDER_SITE_OTHER): Payer: Self-pay | Admitting: Thoracic Surgery (Cardiothoracic Vascular Surgery)

## 2018-07-13 ENCOUNTER — Other Ambulatory Visit: Payer: Self-pay

## 2018-07-13 ENCOUNTER — Encounter: Payer: Self-pay | Admitting: Thoracic Surgery (Cardiothoracic Vascular Surgery)

## 2018-07-13 VITALS — BP 135/77 | HR 82 | Resp 16 | Ht 61.0 in | Wt 89.4 lb

## 2018-07-13 DIAGNOSIS — Z902 Acquired absence of lung [part of]: Secondary | ICD-10-CM

## 2018-07-13 DIAGNOSIS — C3492 Malignant neoplasm of unspecified part of left bronchus or lung: Secondary | ICD-10-CM

## 2018-07-13 NOTE — Progress Notes (Signed)
[address]       [place] [address]             [phone]     HPI: [name] returns for a scheduled follow-up visit  Leslie Boyd is a 73 year old woman with a history of ongoing tobacco abuse, hypertension, hyperlipidemia, anxiety, reflux, and coronary calcifications.  She had a lung cancer screening study done about 2 years ago.  She had a left upper lobe nodule.  Was unchanged at 6 months. In September 2018 PET/CT showed an SUV of 1.  She refused a surgical referral at that time.  A follow-up CT in May showed a slight increase in size.  I saw her last summer and recommended surgery but she delayed for multiple reasons.  Ultimately, I did a lingular sparing left upper lobectomy on 05/03/2017.  The nodule turned out to be a stage Ia (pT1b, N0) adenocarcinoma.  Her postoperative course was uncomplicated and she went home on day 4.  She does have some mild paresthesias in the area of her incision.  She is taking Tylenol for that.  She says that does help.  She is using a nicotine patch but is continuing to smoke 4 to 5 cigarettes daily.  She has not had any respiratory issues.  Past Medical History:  Diagnosis Date  . Anxiety   . Arthritis   . Coronary artery calcification seen on CT scan    cardiologist-- dr Atilano Median [facility] in [facility])  . GERD (gastroesophageal reflux disease)   . History of kidney stones    ureter stent in place  . History of stress test    stress echo 01-29-2018 with dr Atilano Median, cardiologist with [facility] in [facility]  . Hypertension   . Neoplasm of uncertain behavior of left upper lobe of lung    followed by pulmologist-- dr Duane [facility] in [facility])  . Osteopenia   . Renal lesion    left renal pelvis    Current Outpatient Medications  Medication Sig Dispense Refill  . amLODipine (NORVASC) 5 MG tablet Take 5 mg by mouth daily.    . nicotine (NICODERM CQ - DOSED IN MG/24 HOURS) 14 mg/24hr patch Place 1 patch (14 mg total) onto the skin daily.  28 patch 0  . pravastatin (PRAVACHOL) 20 MG tablet Take 20 mg by mouth daily.    . promethazine (PHENERGAN) 12.5 MG tablet Take 1 tablet (12.5 mg total) by mouth every 6 (six) hours as needed for nausea or vomiting. 10 tablet 0  . ranitidine (ZANTAC) 150 MG tablet Take 150 mg by mouth 2 (two) times daily.    . sodium chloride (OCEAN) 0.65 % SOLN nasal spray Place 1 spray into both nostrils 4 (four) times daily as needed for congestion.     No current facility-administered medications for this visit.     Physical Exam BP 135/77 (BP Location: Right Arm, Patient Position: Sitting, Cuff Size: Normal)   Pulse 82   Resp 16   Ht 5\' 1"  (1.549 m)   Wt 89 lb 6.4 oz (40.6 kg)   SpO2 96% Comment: RA  BMI 16.7 kg/m  73 year old woman in no acute distress Alert and oriented x3 with no focal deficits Lungs clear with equal breath sounds bilaterally Incision well-healed  Impression: Leslie Boyd is a 73 year old woman who is now about 3 months out from a lingular sparing left upper lobectomy for stage Ia adenocarcinoma.  Overall she is doing well at  this point time with only some minimal paresthesias related to her surgery.  She is not having any respiratory issues.  Tobacco abuse.  Unfortunately she continues to smoke.  She has been using a patch but continues to smoke in addition to that.  She seems very reluctant to even consider completely stopping smoking.  I emphasized the importance of that.  She has been stalling on making an appointment to see an oncologist.  She wanted to know why she had to see an oncologist.  Finally I did get her to agree to see an oncologist but she was to be seen at [facility].  We will set her up to see Dr. Marin Olp over at [facility].  She will need continued follow-up after she no longer comes back to see me.  Plan: Referral to Dr. Marin Olp Return in 6 months with PA and lateral chest x-ray  [name], MD [facility] [phone]

## 2018-07-14 ENCOUNTER — Encounter: Payer: Self-pay | Admitting: *Deleted

## 2018-07-14 NOTE — Progress Notes (Signed)
Reached out to Teryl Lucy to introduce myself as the office RN Navigator and explain our new patient process. Reviewed the reason for their referral and scheduled their new patient appointment along with labs. Provided address and directions to the office including call back phone number. Reviewed with patient any concerns they may have or any possible barriers to attending their appointment.   Informed patient about my role as a navigator and that I will meet with them prior to their New Patient appointment and more fully discuss what services I can provide. At this time patient has no further questions or needs.

## 2018-07-22 ENCOUNTER — Inpatient Hospital Stay: Payer: Medicare HMO | Attending: Hematology & Oncology | Admitting: Hematology & Oncology

## 2018-07-22 ENCOUNTER — Other Ambulatory Visit: Payer: Self-pay

## 2018-07-22 ENCOUNTER — Encounter: Payer: Self-pay | Admitting: *Deleted

## 2018-07-22 VITALS — BP 140/60 | HR 87 | Temp 99.1°F | Resp 16 | Wt 92.0 lb

## 2018-07-22 DIAGNOSIS — C3492 Malignant neoplasm of unspecified part of left bronchus or lung: Secondary | ICD-10-CM

## 2018-07-22 DIAGNOSIS — Z902 Acquired absence of lung [part of]: Secondary | ICD-10-CM | POA: Insufficient documentation

## 2018-07-22 DIAGNOSIS — C3412 Malignant neoplasm of upper lobe, left bronchus or lung: Secondary | ICD-10-CM | POA: Diagnosis present

## 2018-07-22 DIAGNOSIS — M8589 Other specified disorders of bone density and structure, multiple sites: Secondary | ICD-10-CM

## 2018-07-22 DIAGNOSIS — F17210 Nicotine dependence, cigarettes, uncomplicated: Secondary | ICD-10-CM | POA: Insufficient documentation

## 2018-07-22 DIAGNOSIS — I10 Essential (primary) hypertension: Secondary | ICD-10-CM

## 2018-07-22 NOTE — Progress Notes (Signed)
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 Follow-up  : I had lung cancer in my left lung.  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  She has a long history of tobacco use.  She probably has a 50-pack-year history of tobacco use.  Last year, she felt that she needed to have a CT scan done of her lungs.  She was not having any cough.  There is no chest pain.  She is having no hemoptysis.  She was having no fatigue or weakness.  She subsequently had a low-dose CT scan.  Surprisingly, this showed a 13.8 mm nodule in the left upper lung.  She ultimately under went resection.  She was referred to Dr. Merilynn Finland.  He did a left upper lobectomy on 05/03/2018.  The pathology report ([id number]) showed a 1.1 cm adenocarcinoma.  All margins were negative.  There were 6- lymph nodes.  There is no lymphovascular invasion.  She had subpleural connective tissue invasion.  Overall, she was stage IA2 (pT1bN0M0) adenocarcinoma of the left upper lung.  She is still smoking.  She is trying to cut back on tobacco use.  There is a history of breast cancer in a maternal great aunt.  She has had no headache.  There is been no nausea or vomiting.  There is been no change in bowel or bladder habits.  She was kindly referred to the [facility] so that we could develop a surveillance protocol for her.  She has 2 children.  She has 7 grandchildren.  Her husband is not doing well physically.  She is trying to stay healthy so that she can help him.  Overall, her performance status is ECOG 1.     Past Medical History:  Diagnosis Date  . Anxiety   . Arthritis   . Coronary artery calcification seen on CT scan    cardiologist-- dr Atilano Median [facility] in [facility])  . GERD  (gastroesophageal reflux disease)   . History of kidney stones    ureter stent in place  . History of stress test    stress echo 01-29-2018 with dr Atilano Median, cardiologist with [facility] in [facility]  . Hypertension   . Neoplasm of uncertain behavior of left upper lobe of lung    followed by pulmologist-- dr Duane [facility] in [facility])  . Osteopenia   . Renal lesion    left renal pelvis  :  Past Surgical History:  Procedure Laterality Date  . CATARACT EXTRACTION W/ INTRAOCULAR LENS  IMPLANT, BILATERAL  2016  . [place]  . CYSTOSCOPY WITH RETROGRADE PYELOGRAM, URETEROSCOPY AND STENT PLACEMENT Left 04/27/2018   Procedure: CYSTOSCOPY WITH BILATERAL RETROGRADE PYELOGRAM, POSSIBLE LEFT URETEROSCOPY AND POSSIBLE STENT PLACEMENT;  Surgeon: Ceasar Mons, MD;  Location: [facility];  Service: Urology;  Laterality: Left;  . SKIN GRAFT    . TONSILLECTOMY  1963  . VIDEO ASSISTED THORACOSCOPY (VATS)/WEDGE RESECTION Left 05/03/2018   Procedure: VIDEO ASSISTED THORACOSCOPY (VATS)/WEDGE RESECTION;  Surgeon: [name], MD;  Location: [facility] OR;  Service: Thoracic;  Laterality: Left;  :   Current Outpatient Medications:  .  amLODipine (NORVASC) 5 MG tablet, Take 5 mg by mouth daily., Disp: , Rfl:  .  LORazepam (ATIVAN) 0.5 MG tablet, Take  0.5 mg by mouth as needed., Disp: , Rfl:  .  nicotine (NICODERM CQ - DOSED IN MG/24 HOURS) 14 mg/24hr patch, Place 1 patch (14 mg total) onto the skin daily., Disp: 28 patch, Rfl: 0 .  pravastatin (PRAVACHOL) 20 MG tablet, Take 20 mg by mouth daily., Disp: , Rfl:  .  promethazine (PHENERGAN) 12.5 MG tablet, Take 1 tablet (12.5 mg total) by mouth every 6 (six) hours as needed for nausea or vomiting., Disp: 10 tablet, Rfl: 0 .  ranitidine (ZANTAC) 150 MG tablet, Take 150 mg by mouth 2 (two) times daily., Disp: , Rfl:  .  sodium chloride (OCEAN) 0.65 % SOLN nasal spray, Place 1 spray into both nostrils 4 (four) times daily as  needed for congestion., Disp: , Rfl: :  :  Allergies  Allergen Reactions  . Zofran [Ondansetron Hcl] Other (See Comments)    Excessive sweating  . Morphine And Related Nausea And Vomiting and Rash  :  Family History  Problem Relation Age of Onset  . Heart disease Father   . COPD Sister   . Heart disease Brother   . Colon cancer Neg Hx   :  Social History   Socioeconomic History  . Marital status: Married    Spouse name: Not on file  . Number of children: Not on file  . Years of education: Not on file  . Highest education level: Not on file  Occupational History  . Not on file  Social Needs  . Financial resource strain: Not on file  . Food insecurity:    Worry: Not on file    Inability: Not on file  . Transportation needs:    Medical: Not on file    Non-medical: Not on file  Tobacco Use  . Smoking status: Current Every Day Smoker    Packs/day: 1.00    Years: 53.00    Pack years: 53.00    Types: Cigarettes  . Smokeless tobacco: Never Used  . Tobacco comment: smokes 4 cigarettes a day  Substance and Sexual Activity  . Alcohol use: Yes    Comment: occ  . Drug use: No  . Sexual activity: Yes  Lifestyle  . Physical activity:    Days per week: Not on file    Minutes per session: Not on file  . Stress: Not on file  Relationships  . Social connections:    Talks on phone: Not on file    Gets together: Not on file    Attends religious service: Not on file    Active member of club or organization: Not on file    Attends meetings of clubs or organizations: Not on file    Relationship status: Not on file  . Intimate partner violence:    Fear of current or ex partner: Not on file    Emotionally abused: Not on file    Physically abused: Not on file    Forced sexual activity: Not on file  Other Topics Concern  . Not on file  Social History Narrative  . Not on file  :  Review of Systems  Constitutional: Negative.   HENT: Negative.   Eyes: Negative.    Respiratory: Negative.   Cardiovascular: Negative.   Gastrointestinal: Negative.   Genitourinary: Negative.   Musculoskeletal: Negative.   Skin: Negative.   Neurological: Negative.   Endo/Heme/Allergies: Negative.   Psychiatric/Behavioral: Negative.      Exam: Petite white female in no obvious distress.  Vital signs show temperature of 99.1.  Pulse 87.  Blood pressure 140/60.  Weight is 92 pounds.  Head neck exam shows no ocular or oral lesions.  She has no scleral icterus.  She has no intraoral lesions.  Her thyroid is nonpalpable.  She has no adenopathy in the neck.  Lungs are clear bilaterally.  She has no rales, wheeze or rhonchi.  Cardiac exam regular rate and rhythm with no murmurs, rubs or bruits.  Abdomen is soft.  She has good bowel sounds.  There is no fluid wave.  There is no palpable liver or spleen tip.  Back exam shows the VATS surgical scars on the left lateral chest wall.  These are well-healed.  There is some slight tenderness at the VATS site.  There is no tenderness over the spine.  Extremities shows no clubbing, cyanosis or edema.  Neurological exam shows no focal neurological deficits.  Skin exam shows no rashes, ecchymoses or petechia. @IPVITALS @   No results for input(s): WBC, HGB, HCT, PLT in the last 72 hours. No results for input(s): NA, K, CL, CO2, GLUCOSE, BUN, CREATININE, CALCIUM in the last 72 hours.  Blood smear review: None  Pathology: As above    Assessment and Plan: Ms. Forge is a very charming 73 year old white female with a stage IA2 adenocarcinoma of the left upper lung.  She had this resected.  She was totally asymptomatic with this.  It is amazing that she felt "compelled" to have this low-dose screening CT scan done.  I just do not see any indication for adjuvant therapy.  I really think that her five-year survival with surgery alone is about 80-85%.  I just do not see any negative risk factors with her cancer.  We will get a surveillance  program set up for her.  I believe that a CT scan is necessary.  I would do a CT scan in February.  I probably would do a surveillance CT scan every 4 months for the first year and then every 6 months for the second year.  She is quite happy that we do not have to do any therapy on her.  I spent about 45-50 minutes with her.  All the time spent face-to-face.  I did go ahead and get her a prayer blanket.  She is very happy to have a prayer blanket.  She is definitely a woman of faith.  I answered all of her questions.  I held to arrange for her follow-up visit.  We will see her back in about a month or so.  We will have the CT scan done the same day that we see her.  I am just so happy that she is trying hard to stop smoking.  Hopefully, she will quit tobacco use within the next 3 or 4 months.  She is clearly a very strong woman.  She was a Education officer, museum.  She has a lot of resolve.

## 2018-07-22 NOTE — Progress Notes (Signed)
Initial RN Navigator Patient Visit  Name: Leslie Boyd Date of Referral : 07/13/2018 Diagnosis: Lung  Met with patient prior to their visit with MD. Hanley Seamen patient "Your Patient Navigator" handout which explains my role, areas in which I am able to help, and all the contact information for myself and the office. Also gave patient MD and Navigator business card. Reviewed with patient the general overview of expected course after initial diagnosis and time frame for all steps to be completed.  Patient will likely not need any further intervention. Patient knows to contact the office if she has any needs or concerns.

## 2018-07-30 ENCOUNTER — Encounter: Payer: Self-pay | Admitting: *Deleted

## 2018-07-30 NOTE — Progress Notes (Signed)
Follow up with patient after her appointment last week. Patient's only treatment will be surveillance therefor, I will sign off on active follow up. Patient has my contact information if they need to reach out for any needs or questions that arise.

## 2018-08-16 ENCOUNTER — Telehealth: Payer: Self-pay | Admitting: Hematology & Oncology

## 2018-08-16 ENCOUNTER — Telehealth: Payer: Self-pay | Admitting: *Deleted

## 2018-08-16 NOTE — Telephone Encounter (Signed)
Message received from patient requesting to move up CT Scan d/t increased anxiety.  Call placed back to patient to inform her that CT scan cannot be moved up at this time.  Pt appreciative of call back and has no other questions at this time.

## 2018-08-16 NOTE — Telephone Encounter (Signed)
Called and spoke with patient in regards to moving LAB appt for 2/256 to prior to CT Scan. Patient voiced understanding of new time per 2/10 staff message

## 2018-09-01 ENCOUNTER — Inpatient Hospital Stay: Payer: Medicare HMO

## 2018-09-01 ENCOUNTER — Other Ambulatory Visit: Payer: Self-pay

## 2018-09-01 ENCOUNTER — Inpatient Hospital Stay: Payer: Medicare HMO | Attending: Hematology & Oncology | Admitting: Hematology & Oncology

## 2018-09-01 ENCOUNTER — Other Ambulatory Visit: Payer: Medicare HMO

## 2018-09-01 ENCOUNTER — Ambulatory Visit (HOSPITAL_BASED_OUTPATIENT_CLINIC_OR_DEPARTMENT_OTHER)
Admission: RE | Admit: 2018-09-01 | Discharge: 2018-09-01 | Disposition: A | Discharge status: Home / Self Care | Payer: Medicare HMO | Source: Ambulatory Visit | Attending: Hematology & Oncology | Admitting: Hematology & Oncology

## 2018-09-01 VITALS — BP 135/60 | HR 73 | Temp 98.0°F | Resp 18

## 2018-09-01 DIAGNOSIS — Z87891 Personal history of nicotine dependence: Secondary | ICD-10-CM | POA: Diagnosis not present

## 2018-09-01 DIAGNOSIS — C3492 Malignant neoplasm of unspecified part of left bronchus or lung: Secondary | ICD-10-CM

## 2018-09-01 DIAGNOSIS — C3412 Malignant neoplasm of upper lobe, left bronchus or lung: Secondary | ICD-10-CM | POA: Diagnosis present

## 2018-09-01 DIAGNOSIS — Z79899 Other long term (current) drug therapy: Secondary | ICD-10-CM | POA: Insufficient documentation

## 2018-09-01 NOTE — Progress Notes (Signed)
Hematology and Oncology Follow Up Visit  Leslie Boyd [id number] 03-16-1946 73 y.o. 09/01/2018   Principle Diagnosis:   Stage IA2 (pT1bN0M0) --adenocarcinoma of the left upper lung  Current Therapy:    Status post resection on 05/03/2018     Interim History:  Leslie Boyd is back for follow-up.  This is her second office visit.  We first saw her back in January.  At that time, she had her tumor resected back in late October.  She had a early stage lung cancer.  This was an adenocarcinoma.  She had 6- lymph nodes.  There was no lymphovascular invasion.  She had no adverse prognostic markers.  We did a CT scan today.  The CT scan did not show any evidence of recurrent lung cancer.  She is doing well.  She has some neuropathic type pain on her chest wall.  I told her this may go away.  This can sometimes happen with lung surgery.  Hopefully, this will improve over time.  She is eating well.  She has had no nausea or vomiting.  She has had no change in bowel or bladder habits.  There is been no bleeding.  She has had no leg swelling.  She has had no headache.  Overall, her performance status is ECOG 0.  Medications:  Current Outpatient Medications:  .  amLODipine (NORVASC) 5 MG tablet, Take 5 mg by mouth daily., Disp: , Rfl:  .  calcium carbonate (OSCAL) 1500 (600 Ca) MG TABS tablet, Take 1,500 mg by mouth daily with breakfast., Disp: , Rfl:  .  LORazepam (ATIVAN) 0.5 MG tablet, Take 0.5 mg by mouth as needed., Disp: , Rfl:  .  pravastatin (PRAVACHOL) 20 MG tablet, Take 20 mg by mouth daily., Disp: , Rfl:  .  promethazine (PHENERGAN) 12.5 MG tablet, Take 1 tablet (12.5 mg total) by mouth every 6 (six) hours as needed for nausea or vomiting., Disp: 10 tablet, Rfl: 0 .  ranitidine (ZANTAC) 150 MG tablet, Take 150 mg by mouth 2 (two) times daily., Disp: , Rfl:  .  sodium chloride (OCEAN) 0.65 % SOLN nasal spray, Place 1 spray into both nostrils 4 (four) times daily as needed for  congestion., Disp: , Rfl:  .  escitalopram (LEXAPRO) 10 MG tablet, , Disp: , Rfl:  .  nicotine (NICODERM CQ - DOSED IN MG/24 HOURS) 14 mg/24hr patch, Place 1 patch (14 mg total) onto the skin daily. (Patient not taking: Reported on 09/01/2018), Disp: 28 patch, Rfl: 0  Allergies:  Allergies  Allergen Reactions  . Zofran [Ondansetron Hcl] Other (See Comments)    Excessive sweating  . Morphine And Related Nausea And Vomiting and Rash    Past Medical History, Surgical history, Social history, and Family History were reviewed and updated.  Review of Systems: Review of Systems  Constitutional: Negative.   HENT:  Negative.   Eyes: Negative.   Respiratory: Negative.   Cardiovascular: Positive for chest pain.  Gastrointestinal: Negative.   Endocrine: Negative.   Genitourinary: Negative.    Musculoskeletal: Negative.   Skin: Negative.   Neurological: Negative.   Hematological: Negative.   Psychiatric/Behavioral: Negative.     Physical Exam:  oral temperature is 98 F (36.7 C). Her blood pressure is 135/60 and her pulse is 73. Her respiration is 18 and oxygen saturation is 100%.   Wt Readings from Last 3 Encounters:  07/22/18 92 lb (41.7 kg)  07/13/18 89 lb 6.4 oz (40.6 kg)  06/08/18 88 lb (39.9  kg)    Physical Exam Vitals signs reviewed.  HENT:     Head: Normocephalic and atraumatic.  Eyes:     Pupils: Pupils are equal, round, and reactive to light.  Neck:     Musculoskeletal: Normal range of motion.  Cardiovascular:     Rate and Rhythm: Normal rate and regular rhythm.     Heart sounds: Normal heart sounds.  Pulmonary:     Effort: Pulmonary effort is normal.     Breath sounds: Normal breath sounds.  Abdominal:     General: Bowel sounds are normal.     Palpations: Abdomen is soft.  Musculoskeletal: Normal range of motion.        General: No tenderness or deformity.  Lymphadenopathy:     Cervical: No cervical adenopathy.  Skin:    General: Skin is warm and dry.      Findings: No erythema or rash.  Neurological:     Mental Status: She is alert and oriented to person, place, and time.  Psychiatric:        Behavior: Behavior normal.        Thought Content: Thought content normal.        Judgment: Judgment normal.      Lab Results  Component Value Date   WBC 14.5 (H) 05/05/2018   HGB 10.0 (L) 05/05/2018   HCT 32.9 (L) 05/05/2018   MCV 95.9 05/05/2018   PLT 297 05/05/2018     Chemistry      Component Value Date/Time   NA 141 05/05/2018 0530   K 3.4 (L) 05/05/2018 0530   CL 108 05/05/2018 0530   CO2 27 05/05/2018 0530   BUN 9 05/05/2018 0530   CREATININE 0.48 05/05/2018 0530      Component Value Date/Time   CALCIUM 8.6 (L) 05/05/2018 0530   ALKPHOS 47 05/05/2018 0530   AST 20 05/05/2018 0530   ALT 12 05/05/2018 0530   BILITOT 0.3 05/05/2018 0530       Impression and Plan: Leslie Boyd is a 73 year old white female.  She is a past smoker.  She is doing well right now.  She had her early stage lung cancer resected.  She is still smoking a little bit but is trying her best to cut back.  She understands very well the consequences of continued tobacco use.  We will plan for another CT scan in 6 months.  I think this is appropriate given that she has stage Ia disease.  We will see about her lab work when we get her back.  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  She apparently sees a urologist who is following this.   [name], MD 2/26/20202:34 PM

## 2019-02-21 ENCOUNTER — Other Ambulatory Visit: Payer: Self-pay | Admitting: *Deleted

## 2019-02-21 DIAGNOSIS — C3492 Malignant neoplasm of unspecified part of left bronchus or lung: Secondary | ICD-10-CM

## 2019-02-23 ENCOUNTER — Telehealth: Payer: Self-pay | Admitting: *Deleted

## 2019-02-23 NOTE — Telephone Encounter (Signed)
Call received from patient stating that she had a missed call from this office.  Call placed back to patient and notified pt that there are no recent phone calls noted from this office.  Pt.'s appt times on 03/02/19 reviewed with patient.  Patient appreciative of information and has no further questions or concerns at this time.

## 2019-03-02 ENCOUNTER — Other Ambulatory Visit: Payer: Medicare HMO

## 2019-03-02 ENCOUNTER — Ambulatory Visit (HOSPITAL_BASED_OUTPATIENT_CLINIC_OR_DEPARTMENT_OTHER)
Admission: RE | Admit: 2019-03-02 | Discharge: 2019-03-02 | Disposition: A | Discharge status: Home / Self Care | Payer: Medicare HMO | Source: Ambulatory Visit | Attending: Hematology & Oncology | Admitting: Hematology & Oncology

## 2019-03-02 ENCOUNTER — Inpatient Hospital Stay: Payer: Medicare HMO | Attending: Hematology & Oncology | Admitting: Hematology & Oncology

## 2019-03-02 ENCOUNTER — Inpatient Hospital Stay: Payer: Medicare HMO

## 2019-03-02 ENCOUNTER — Encounter: Payer: Self-pay | Admitting: Hematology & Oncology

## 2019-03-02 ENCOUNTER — Other Ambulatory Visit: Payer: Self-pay

## 2019-03-02 VITALS — BP 139/73 | HR 73 | Temp 97.3°F | Resp 20 | Wt 92.1 lb

## 2019-03-02 DIAGNOSIS — Z79899 Other long term (current) drug therapy: Secondary | ICD-10-CM | POA: Insufficient documentation

## 2019-03-02 DIAGNOSIS — C3492 Malignant neoplasm of unspecified part of left bronchus or lung: Secondary | ICD-10-CM

## 2019-03-02 DIAGNOSIS — Z87891 Personal history of nicotine dependence: Secondary | ICD-10-CM | POA: Diagnosis not present

## 2019-03-02 DIAGNOSIS — C3412 Malignant neoplasm of upper lobe, left bronchus or lung: Secondary | ICD-10-CM | POA: Diagnosis present

## 2019-03-02 LAB — COMPREHENSIVE METABOLIC PANEL
ALT: 8 U/L (ref 0–44)
AST: 16 U/L (ref 15–41)
Albumin: 4.5 g/dL (ref 3.5–5.0)
Alkaline Phosphatase: 66 U/L (ref 38–126)
Anion gap: 9 (ref 5–15)
BUN: 18 mg/dL (ref 8–23)
CO2: 29 mmol/L (ref 22–32)
Calcium: 9 mg/dL (ref 8.9–10.3)
Chloride: 104 mmol/L (ref 98–111)
Creatinine, Ser: 0.71 mg/dL (ref 0.44–1.00)
GFR calc Af Amer: >60 mL/min (ref >60)
GFR calc non Af Amer: >60 mL/min (ref >60)
Glucose, Bld: 85 mg/dL (ref 70–99)
Potassium: 4.1 mmol/L (ref 3.5–5.1)
Sodium: 142 mmol/L (ref 135–145)
Total Bilirubin: 0.5 mg/dL (ref 0.3–1.2)
Total Protein: 7.2 g/dL (ref 6.5–8.1)

## 2019-03-02 LAB — CBC WITH DIFFERENTIAL (CANCER CENTER ONLY)
Abs Immature Granulocytes: 0.04 10*3/uL (ref 0.00–0.07)
Basophils Absolute: 0.1 10*3/uL (ref 0.0–0.1)
Basophils Relative: 1 %
Eosinophils Absolute: 0.2 10*3/uL (ref 0.0–0.5)
Eosinophils Relative: 2 %
HCT: 37.4 % (ref 36.0–46.0)
Hemoglobin: 11.9 g/dL — ABNORMAL LOW (ref 12.0–15.0)
Immature Granulocytes: 0 %
Lymphocytes Relative: 27 %
Lymphs Abs: 2.7 10*3/uL (ref 0.7–4.0)
MCH: 30.4 pg (ref 26.0–34.0)
MCHC: 31.8 g/dL (ref 30.0–36.0)
MCV: 95.7 fL (ref 80.0–100.0)
Monocytes Absolute: 0.8 10*3/uL (ref 0.1–1.0)
Monocytes Relative: 8 %
Neutro Abs: 6.1 10*3/uL (ref 1.7–7.7)
Neutrophils Relative %: 62 %
Platelet Count: 278 10*3/uL (ref 150–400)
RBC: 3.91 MIL/uL (ref 3.87–5.11)
RDW: 13.2 % (ref 11.5–15.5)
WBC Count: 9.9 10*3/uL (ref 4.0–10.5)
nRBC: 0 % (ref 0.0–0.2)

## 2019-03-02 NOTE — Progress Notes (Signed)
Hematology and Oncology Follow Up Visit  Leslie Boyd [id number] August 21, 1945 73 y.o. 03/02/2019   Principle Diagnosis:   Stage IA2 (pT1bN0M0) --adenocarcinoma of the left upper lung  Current Therapy:    Status post resection on 05/03/2018     Interim History:  Leslie Boyd is back for follow-up.  She looks good today.  Unfortunately, she really does not feel all that well because she is restricted by the coronavirus.  She really is not happy with how the coronavirus has been managed.  She says this really has affected her quality of life.  As far as her lung cancer is concerned, she is doing great.  She had a chest CT scan done today.  There is nothing that looks like recurrent cancer on the chest CT scan.  She did have a lesion in the left kidney measuring 2.9 cm.  This has been known previously.  She is followed by urology for this.  She has had no hematuria.  She has had no dysuria.  There is been no abdominal pain.  She is trying to stay as active as possible.  She is still smoking.  She is trying to cut back.  She is really been aggressive with trying to stop this.  She has had no fever.  She has had no headache.  She has had no nausea or vomiting.  Overall, her performance status is ECOG 1.  Medications:  Current Outpatient Medications:  .  amLODipine (NORVASC) 5 MG tablet, Take 5 mg by mouth daily., Disp: , Rfl:  .  calcium carbonate (OSCAL) 1500 (600 Ca) MG TABS tablet, Take 1,500 mg by mouth daily with breakfast., Disp: , Rfl:  .  cetirizine (ZYRTEC) 10 MG tablet, Take 10 mg by mouth daily., Disp: , Rfl:  .  famotidine (PEPCID) 20 MG tablet, 2 (two) times daily., Disp: , Rfl:  .  LORazepam (ATIVAN) 0.5 MG tablet, Take 0.5 mg by mouth as needed., Disp: , Rfl:  .  pravastatin (PRAVACHOL) 20 MG tablet, Take 20 mg by mouth daily., Disp: , Rfl:  .  promethazine (PHENERGAN) 12.5 MG tablet, Take 1 tablet (12.5 mg total) by mouth every 6 (six) hours as needed for nausea or  vomiting., Disp: 10 tablet, Rfl: 0 .  sodium chloride (OCEAN) 0.65 % SOLN nasal spray, Place 1 spray into both nostrils 4 (four) times daily as needed for congestion., Disp: , Rfl:  .  escitalopram (LEXAPRO) 10 MG tablet, , Disp: , Rfl:  .  nicotine (NICODERM CQ - DOSED IN MG/24 HOURS) 14 mg/24hr patch, Place 1 patch (14 mg total) onto the skin daily. (Patient not taking: Reported on 09/01/2018), Disp: 28 patch, Rfl: 0 .  ranitidine (ZANTAC) 150 MG tablet, Take 150 mg by mouth 2 (two) times daily., Disp: , Rfl:   Allergies:  Allergies  Allergen Reactions  . Zofran [Ondansetron Hcl] Other (See Comments)    Excessive sweating  . Morphine And Related Nausea And Vomiting and Rash    Past Medical History, Surgical history, Social history, and Family History were reviewed and updated.  Review of Systems: Review of Systems  Constitutional: Negative.   HENT:  Negative.   Eyes: Negative.   Respiratory: Negative.   Cardiovascular: Positive for chest pain.  Gastrointestinal: Negative.   Endocrine: Negative.   Genitourinary: Negative.    Musculoskeletal: Negative.   Skin: Negative.   Neurological: Negative.   Hematological: Negative.   Psychiatric/Behavioral: Negative.     Physical Exam:  weight is  92 lb 1.9 oz (41.8 kg). Her oral temperature is 97.3 F (36.3 C) (abnormal). Her blood pressure is 139/73 and her pulse is 73. Her respiration is 20 and oxygen saturation is 99%.   Wt Readings from Last 3 Encounters:  03/02/19 92 lb 1.9 oz (41.8 kg)  07/22/18 92 lb (41.7 kg)  07/13/18 89 lb 6.4 oz (40.6 kg)    Physical Exam Vitals signs reviewed.  HENT:     Head: Normocephalic and atraumatic.  Eyes:     Pupils: Pupils are equal, round, and reactive to light.  Neck:     Musculoskeletal: Normal range of motion.  Cardiovascular:     Rate and Rhythm: Normal rate and regular rhythm.     Heart sounds: Normal heart sounds.  Pulmonary:     Effort: Pulmonary effort is normal.     Breath  sounds: Normal breath sounds.  Abdominal:     General: Bowel sounds are normal.     Palpations: Abdomen is soft.  Musculoskeletal: Normal range of motion.        General: No tenderness or deformity.  Lymphadenopathy:     Cervical: No cervical adenopathy.  Skin:    General: Skin is warm and dry.     Findings: No erythema or rash.  Neurological:     Mental Status: She is alert and oriented to person, place, and time.  Psychiatric:        Behavior: Behavior normal.        Thought Content: Thought content normal.        Judgment: Judgment normal.      Lab Results  Component Value Date   WBC 9.9 03/02/2019   HGB 11.9 (L) 03/02/2019   HCT 37.4 03/02/2019   MCV 95.7 03/02/2019   PLT 278 03/02/2019     Chemistry      Component Value Date/Time   NA 142 03/02/2019 1320   K 4.1 03/02/2019 1320   CL 104 03/02/2019 1320   CO2 29 03/02/2019 1320   BUN 18 03/02/2019 1320   CREATININE 0.71 03/02/2019 1320      Component Value Date/Time   CALCIUM 9.0 03/02/2019 1320   ALKPHOS 66 03/02/2019 1320   AST 16 03/02/2019 1320   ALT 8 03/02/2019 1320   BILITOT 0.5 03/02/2019 1320       Impression and Plan: Leslie Boyd is a 73 year old white female.  She is a past smoker.  She is doing well right now.  She had her early stage lung cancer resected.  She is still smoking a little bit but is trying her best to cut back.  She understands very well the consequences of continued tobacco use.  We will plan for another CT scan in 6 months.  I think this is appropriate given that she has stage Ia disease.   [name], MD 8/26/20204:04 PM

## 2019-03-03 ENCOUNTER — Telehealth: Payer: Self-pay | Admitting: Hematology & Oncology

## 2019-03-03 NOTE — Telephone Encounter (Signed)
lmom to inform patient of appts Feb 2021 per 8/26 LOS

## 2019-04-08 ENCOUNTER — Other Ambulatory Visit: Payer: Self-pay | Admitting: Urology

## 2019-04-08 DIAGNOSIS — N281 Cyst of kidney, acquired: Secondary | ICD-10-CM

## 2019-04-14 ENCOUNTER — Ambulatory Visit
Admission: RE | Admit: 2019-04-14 | Discharge: 2019-04-14 | Disposition: A | Discharge status: Home / Self Care | Payer: Medicare HMO | Source: Ambulatory Visit | Attending: Urology | Admitting: Urology

## 2019-04-14 DIAGNOSIS — N281 Cyst of kidney, acquired: Secondary | ICD-10-CM

## 2019-04-26 DIAGNOSIS — H02834 Dermatochalasis of left upper eyelid: Secondary | ICD-10-CM | POA: Insufficient documentation

## 2019-04-26 DIAGNOSIS — H16223 Keratoconjunctivitis sicca, not specified as Sjogren's, bilateral: Secondary | ICD-10-CM | POA: Insufficient documentation

## 2019-07-06 ENCOUNTER — Other Ambulatory Visit: Payer: Self-pay | Admitting: *Deleted

## 2019-07-06 DIAGNOSIS — C3492 Malignant neoplasm of unspecified part of left bronchus or lung: Secondary | ICD-10-CM

## 2019-08-22 ENCOUNTER — Other Ambulatory Visit: Payer: Self-pay | Admitting: Family

## 2019-08-22 DIAGNOSIS — C3492 Malignant neoplasm of unspecified part of left bronchus or lung: Secondary | ICD-10-CM

## 2019-08-31 ENCOUNTER — Inpatient Hospital Stay: Payer: Medicare HMO | Admitting: Hematology & Oncology

## 2019-08-31 ENCOUNTER — Inpatient Hospital Stay: Payer: Medicare HMO

## 2019-08-31 ENCOUNTER — Ambulatory Visit (HOSPITAL_BASED_OUTPATIENT_CLINIC_OR_DEPARTMENT_OTHER): Admission: RE | Admit: 2019-08-31 | Payer: Medicare HMO | Source: Ambulatory Visit

## 2019-11-16 DIAGNOSIS — R4189 Other symptoms and signs involving cognitive functions and awareness: Secondary | ICD-10-CM | POA: Insufficient documentation

## 2019-12-08 DIAGNOSIS — F411 Generalized anxiety disorder: Secondary | ICD-10-CM | POA: Insufficient documentation

## 2019-12-08 DIAGNOSIS — T50902A Poisoning by unspecified drugs, medicaments and biological substances, intentional self-harm, initial encounter: Secondary | ICD-10-CM | POA: Insufficient documentation

## 2019-12-08 DIAGNOSIS — K219 Gastro-esophageal reflux disease without esophagitis: Secondary | ICD-10-CM | POA: Insufficient documentation

## 2019-12-08 DIAGNOSIS — F03918 Unspecified dementia, unspecified severity, with other behavioral disturbance: Secondary | ICD-10-CM | POA: Insufficient documentation

## 2019-12-14 ENCOUNTER — Telehealth: Payer: Self-pay | Admitting: Hematology & Oncology

## 2019-12-14 NOTE — Telephone Encounter (Signed)
Returned call and LMVM for [employer] @ [facility] regarding.  I asked that she return my call to discuss patient's follow up appointments that have been scheduled

## 2019-12-20 ENCOUNTER — Encounter: Payer: Self-pay | Admitting: *Deleted

## 2019-12-20 ENCOUNTER — Ambulatory Visit (HOSPITAL_BASED_OUTPATIENT_CLINIC_OR_DEPARTMENT_OTHER)
Admission: RE | Admit: 2019-12-20 | Discharge: 2019-12-20 | Disposition: A | Discharge status: Home / Self Care | Payer: Medicare HMO | Source: Ambulatory Visit | Attending: Family | Admitting: Family

## 2019-12-20 ENCOUNTER — Other Ambulatory Visit: Payer: Self-pay

## 2019-12-20 DIAGNOSIS — C3492 Malignant neoplasm of unspecified part of left bronchus or lung: Secondary | ICD-10-CM | POA: Insufficient documentation

## 2019-12-20 NOTE — Progress Notes (Signed)
Dr. Marin Olp notified of call report from CT of the chest.  No new orders received at this time.

## 2019-12-22 ENCOUNTER — Inpatient Hospital Stay: Payer: Medicare HMO | Attending: Hematology & Oncology

## 2019-12-22 ENCOUNTER — Other Ambulatory Visit: Payer: Medicare HMO

## 2019-12-22 ENCOUNTER — Inpatient Hospital Stay (HOSPITAL_BASED_OUTPATIENT_CLINIC_OR_DEPARTMENT_OTHER): Payer: Medicare HMO | Admitting: Hematology & Oncology

## 2019-12-22 ENCOUNTER — Other Ambulatory Visit: Payer: Self-pay

## 2019-12-22 ENCOUNTER — Ambulatory Visit: Payer: Medicare HMO | Admitting: Family

## 2019-12-22 ENCOUNTER — Encounter: Payer: Self-pay | Admitting: Hematology & Oncology

## 2019-12-22 ENCOUNTER — Telehealth: Payer: Self-pay | Admitting: Hematology & Oncology

## 2019-12-22 DIAGNOSIS — C3412 Malignant neoplasm of upper lobe, left bronchus or lung: Secondary | ICD-10-CM | POA: Insufficient documentation

## 2019-12-22 DIAGNOSIS — Z79899 Other long term (current) drug therapy: Secondary | ICD-10-CM | POA: Diagnosis not present

## 2019-12-22 DIAGNOSIS — C3492 Malignant neoplasm of unspecified part of left bronchus or lung: Secondary | ICD-10-CM

## 2019-12-22 DIAGNOSIS — F0390 Unspecified dementia without behavioral disturbance: Secondary | ICD-10-CM | POA: Diagnosis not present

## 2019-12-22 DIAGNOSIS — Z87891 Personal history of nicotine dependence: Secondary | ICD-10-CM | POA: Insufficient documentation

## 2019-12-22 LAB — CBC WITH DIFFERENTIAL (CANCER CENTER ONLY)
Abs Immature Granulocytes: 0.06 10*3/uL (ref 0.00–0.07)
Basophils Absolute: 0.1 10*3/uL (ref 0.0–0.1)
Basophils Relative: 1 %
Eosinophils Absolute: 0.1 10*3/uL (ref 0.0–0.5)
Eosinophils Relative: 0 %
HCT: 36.6 % (ref 36.0–46.0)
Hemoglobin: 11.7 g/dL — ABNORMAL LOW (ref 12.0–15.0)
Immature Granulocytes: 1 %
Lymphocytes Relative: 13 %
Lymphs Abs: 1.6 10*3/uL (ref 0.7–4.0)
MCH: 30.8 pg (ref 26.0–34.0)
MCHC: 32 g/dL (ref 30.0–36.0)
MCV: 96.3 fL (ref 80.0–100.0)
Monocytes Absolute: 1 10*3/uL (ref 0.1–1.0)
Monocytes Relative: 8 %
Neutro Abs: 9.4 10*3/uL — ABNORMAL HIGH (ref 1.7–7.7)
Neutrophils Relative %: 77 %
Platelet Count: 348 10*3/uL (ref 150–400)
RBC: 3.8 MIL/uL — ABNORMAL LOW (ref 3.87–5.11)
RDW: 12.7 % (ref 11.5–15.5)
WBC Count: 12.1 10*3/uL — ABNORMAL HIGH (ref 4.0–10.5)
nRBC: 0 % (ref 0.0–0.2)

## 2019-12-22 LAB — COMPREHENSIVE METABOLIC PANEL
ALT: 23 U/L (ref 0–44)
AST: 17 U/L (ref 15–41)
Albumin: 4.4 g/dL (ref 3.5–5.0)
Alkaline Phosphatase: 92 U/L (ref 38–126)
Anion gap: 6 (ref 5–15)
BUN: 25 mg/dL — ABNORMAL HIGH (ref 8–23)
CO2: 33 mmol/L — ABNORMAL HIGH (ref 22–32)
Calcium: 10.2 mg/dL (ref 8.9–10.3)
Chloride: 102 mmol/L (ref 98–111)
Creatinine, Ser: 1.13 mg/dL — ABNORMAL HIGH (ref 0.44–1.00)
GFR calc Af Amer: 55 mL/min — ABNORMAL LOW (ref >60)
GFR calc non Af Amer: 47 mL/min — ABNORMAL LOW (ref >60)
Glucose, Bld: 137 mg/dL — ABNORMAL HIGH (ref 70–99)
Potassium: 4.8 mmol/L (ref 3.5–5.1)
Sodium: 141 mmol/L (ref 135–145)
Total Bilirubin: 0.2 mg/dL — ABNORMAL LOW (ref 0.3–1.2)
Total Protein: 6.9 g/dL (ref 6.5–8.1)

## 2019-12-22 NOTE — Progress Notes (Signed)
Hematology and Oncology Follow Up Visit  Leslie Boyd [id number] 07/18/1945 74 y.o. 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  Thankfully, she comes in with a son.  She apparently is now developing more in the way of dementia.  She is quite pleasant.  I must say that she certainly is quite talkative.  She apparently was incredibly nervous about her CAT scan.  She told Probation officer that her cancer had come back.  I explained to her that the CAT scan, which was done a week ago, did not show any cancer at all.  She is still in remission.  I suspect that she will always be in remission.  She felt a whole lot better after I explained the CAT scan report.  There are other things on the CAT scan that I do not think are much of a problem.  Of as always, the radiologist comment on cardiac coronary calcifications.  This seems to be on every CAT scan now.  I will know what to really make of this.  She has lost a little bit of weight.  She again was very nervous over the CT scan.  She has had no problems with cough.  She has had no fever.  She has had no bleeding.  Overall, I would have to say that her performance status is probably ECOG 2.    Medications:  Current Outpatient Medications:  .  amLODipine (NORVASC) 5 MG tablet, Take 5 mg by mouth daily., Disp: , Rfl:  .  calcium carbonate (OSCAL) 1500 (600 Ca) MG TABS tablet, Take 1,500 mg by mouth daily with breakfast., Disp: , Rfl:  .  cetirizine (ZYRTEC) 10 MG tablet, Take 10 mg by mouth daily., Disp: , Rfl:  .  escitalopram (LEXAPRO) 10 MG tablet, , Disp: , Rfl:  .  famotidine (PEPCID) 20 MG tablet, 2 (two) times daily., Disp: , Rfl:  .  LORazepam (ATIVAN) 0.5 MG tablet, Take 0.5 mg by mouth as needed., Disp: , Rfl:  .  nicotine (NICODERM CQ - DOSED IN MG/24 HOURS) 14 mg/24hr  patch, Place 1 patch (14 mg total) onto the skin daily. (Patient not taking: Reported on 09/01/2018), Disp: 28 patch, Rfl: 0 .  pravastatin (PRAVACHOL) 20 MG tablet, Take 20 mg by mouth daily., Disp: , Rfl:  .  promethazine (PHENERGAN) 12.5 MG tablet, Take 1 tablet (12.5 mg total) by mouth every 6 (six) hours as needed for nausea or vomiting., Disp: 10 tablet, Rfl: 0 .  ranitidine (ZANTAC) 150 MG tablet, Take 150 mg by mouth 2 (two) times daily., Disp: , Rfl:  .  sodium chloride (OCEAN) 0.65 % SOLN nasal spray, Place 1 spray into both nostrils 4 (four) times daily as needed for congestion., Disp: , Rfl:   Allergies:  Allergies  Allergen Reactions  . Ondansetron Hcl Other (See Comments)    Excessive sweating  . Morphine Nausea And Vomiting  . Morphine And Related Nausea And Vomiting and Rash    Past Medical History, Surgical history, Social history, and Family History were reviewed and updated.  Review of Systems: Review of Systems  Constitutional: Negative.   HENT:  Negative.   Eyes: Negative.   Respiratory: Negative.   Cardiovascular: Positive for chest pain.  Gastrointestinal: Negative.   Endocrine: Negative.   Genitourinary: Negative.  Musculoskeletal: Negative.   Skin: Negative.   Neurological: Negative.   Hematological: Negative.   Psychiatric/Behavioral: Negative.     Physical Exam:  vitals were not taken for this visit.   Wt Readings from Last 3 Encounters:  03/02/19 92 lb 1.9 oz (41.8 kg)  07/22/18 92 lb (41.7 kg)  07/13/18 89 lb 6.4 oz (40.6 kg)    Physical Exam Vitals reviewed.  HENT:     Head: Normocephalic and atraumatic.  Eyes:     Pupils: Pupils are equal, round, and reactive to light.  Cardiovascular:     Rate and Rhythm: Normal rate and regular rhythm.     Heart sounds: Normal heart sounds.  Pulmonary:     Effort: Pulmonary effort is normal.     Breath sounds: Normal breath sounds.  Abdominal:     General: Bowel sounds are normal.      Palpations: Abdomen is soft.  Musculoskeletal:        General: No tenderness or deformity. Normal range of motion.     Cervical back: Normal range of motion.  Lymphadenopathy:     Cervical: No cervical adenopathy.  Skin:    General: Skin is warm and dry.     Findings: No erythema or rash.  Neurological:     Mental Status: She is alert and oriented to person, place, and time.  Psychiatric:        Behavior: Behavior normal.        Thought Content: Thought content normal.        Judgment: Judgment normal.      Lab Results  Component Value Date   WBC 12.1 (H) 12/22/2019   HGB 11.7 (L) 12/22/2019   HCT 36.6 12/22/2019   MCV 96.3 12/22/2019   PLT 348 12/22/2019     Chemistry      Component Value Date/Time   NA 141 12/22/2019 1132   K 4.8 12/22/2019 1132   CL 102 12/22/2019 1132   CO2 33 (H) 12/22/2019 1132   BUN 25 (H) 12/22/2019 1132   CREATININE 1.13 (H) 12/22/2019 1132      Component Value Date/Time   CALCIUM 10.2 12/22/2019 1132   ALKPHOS 92 12/22/2019 1132   AST 17 12/22/2019 1132   ALT 23 12/22/2019 1132   BILITOT 0.2 (L) 12/22/2019 1132       Impression and Plan: Leslie Boyd is a 74 year old white female.  She is a past smoker.  She is doing well right now.  She had her early stage lung cancer resected.  She says that she has stopped smoking.  I am happy about this.  We will plan for another CT scan in 6 months.  I think this is appropriate given that she has stage Ia disease.   [name], MD 6/17/202112:36 PM

## 2019-12-22 NOTE — Addendum Note (Signed)
Addended by: Melton Krebs on: 12/22/2019 01:50 PM   Modules accepted: Orders

## 2019-12-22 NOTE — Telephone Encounter (Signed)
Appointments scheduled calendar printed per 6/17 los

## 2020-05-04 ENCOUNTER — Telehealth: Payer: Self-pay | Admitting: *Deleted

## 2020-05-04 ENCOUNTER — Encounter: Payer: Self-pay | Admitting: *Deleted

## 2020-05-04 NOTE — Progress Notes (Signed)
Pt in lobby asking to speak with nurse.  This RN spoke with patient and patient is concerned that her daughter and son-in-law have removed her cancer diagnosis from her history on her medical records.  Pt has an AVS with her from [facility] dated 04/16/20 with neoplasm of the lung circled by patient.  Pt states that this diagnosis is no longer on her medical records and is concerned that her daughter has removed it.  Informed pt that diagnosis remains in her chart, but she is not convinced.  Pt left office stating that she would contact [facility] about her medical records.  Pt states that her sister drove her here.  Call placed to patient's son, Jenny Reichmann to notify him of pt.'s agitation and concern.  Jenny Reichmann is appreciative of call and states that he will check on his mother.

## 2020-05-04 NOTE — Telephone Encounter (Signed)
Call received from patient stating that she would like for Dr. Marin Olp to go over her xrays from 04/16/20 done at [facility].  Informed pt that both xrays from 04/16/20, xray of hips and xray of ribs are negative.  Patient is convinced that she has lung cancer and is reading a report that this nurse does not see on care everywhere.  Instructed pt to contact [facility], where xrays were done to have them review xrays with her.  Pt appreciative of assistance and states that she will contact [facility] urgent care for more assist.

## 2020-06-27 ENCOUNTER — Ambulatory Visit (HOSPITAL_BASED_OUTPATIENT_CLINIC_OR_DEPARTMENT_OTHER): Admission: RE | Admit: 2020-06-27 | Payer: Medicare HMO | Source: Ambulatory Visit

## 2020-06-27 ENCOUNTER — Inpatient Hospital Stay: Payer: Medicare HMO | Attending: Internal Medicine

## 2020-06-27 ENCOUNTER — Inpatient Hospital Stay: Payer: Medicare HMO | Admitting: Hematology & Oncology

## 2020-07-12 ENCOUNTER — Telehealth: Payer: Self-pay

## 2020-07-12 NOTE — Telephone Encounter (Signed)
Patient contacted the office with questions regarding diagnosis, specifically malignant neoplasm of lung.  She was wanting further explanation from Dr. Roxan Hockey as to if this meant her cancer was back.  She is s/p VATS/ wedge with Dr. Roxan Hockey 10/285/21.  Patient also stated that she has continued to smoke after diagnoses.  Called patient back, but was unable to leave voicemail message as her mailbox was full.  Will await return call if warranted.

## 2020-07-18 ENCOUNTER — Telehealth: Payer: Self-pay | Admitting: *Deleted

## 2020-07-18 NOTE — Telephone Encounter (Signed)
Leslie Boyd contacted the office stating that a chest xray she had on 04/16/20 showed neoplasm of the left lung. She had this cxr r/t a fall. I reassured her that the chest xray impression did not mention any sort of lung mass. I informed her that it is however apart of her medical history, this may be where she is seeing neoplasm of the left lung. She seemed quite confused on the telephone. She has not seen Dr. Roxan Hockey since 07/13/2018. Per her oncologist Dr. Antonieta Pert  last note on 12/22/19 pt was reassured that cancer was not seen on her most recent CT scan at that time. Per Ennever's note, pt is to f/u with him in 6 months. Advised pt that it has been 6 months since her last CT scan and visit with Dr. Marin Olp. Advised pt to schedule these appts with Dr. Marin Olp. Pt called back stating she has a CT scan schedule for tomorrow. It was explained that her oncologist will be the one to go over the results of the CT scan with her. I offered to call her son, Jenny Reichmann, to discuss all of this with him, however she declined this. Pt with no other questions at this time.

## 2020-07-19 ENCOUNTER — Ambulatory Visit (HOSPITAL_BASED_OUTPATIENT_CLINIC_OR_DEPARTMENT_OTHER)
Admission: RE | Admit: 2020-07-19 | Discharge: 2020-07-19 | Disposition: A | Discharge status: Home / Self Care | Payer: Medicare HMO | Source: Ambulatory Visit | Attending: Hematology & Oncology | Admitting: Hematology & Oncology

## 2020-07-19 ENCOUNTER — Other Ambulatory Visit: Payer: Self-pay

## 2020-07-19 DIAGNOSIS — C3492 Malignant neoplasm of unspecified part of left bronchus or lung: Secondary | ICD-10-CM | POA: Insufficient documentation

## 2020-07-20 ENCOUNTER — Telehealth: Payer: Self-pay | Admitting: *Deleted

## 2020-07-20 NOTE — Telephone Encounter (Signed)
Notified pt of scan results. No concerns at this time.

## 2020-07-20 NOTE — Telephone Encounter (Signed)
-----   Message from [name], MD sent at 07/19/2020  4:56 PM EST ----- Call - the CT scan does not show any recurrent cancer!!  Leslie Boyd

## 2020-07-31 ENCOUNTER — Telehealth: Payer: Self-pay | Admitting: Thoracic Surgery (Cardiothoracic Vascular Surgery)

## 2020-07-31 NOTE — Telephone Encounter (Signed)
I received a letter from Mrs. Leslie Boyd concerned about malignant neoplasm of the left lung being present on her problem list.  I assured her that this was listed on the problem list for historical purposes.  That lesion was resected.  Her most recent CT showed no evidence of recurrent or metastatic cancer.  She is having some family issues.  I recommended that she address those issues with her primary care.

## 2020-08-03 ENCOUNTER — Telehealth: Payer: Self-pay | Admitting: *Deleted

## 2020-08-03 NOTE — Telephone Encounter (Signed)
Mrs. Gruetzmacher contacted the office with concerns of her chart reading malignant neoplasm of left lung. Dr. Roxan Hockey spoke with Mrs. Burnsed regarding the same concerns earlier this week on 07/31/20. Reassured pt that this is a past medical diagnosis and that it is not her current state of health. Pt requesting a f/u CT of chest. Advised pt to reach out to her PCP if she feels a new CT needs to be performed. Pt verbalized understanding.

## 2020-08-24 ENCOUNTER — Other Ambulatory Visit (HOSPITAL_COMMUNITY): Payer: Self-pay | Admitting: Urology

## 2020-08-24 ENCOUNTER — Encounter: Payer: Self-pay | Admitting: *Deleted

## 2020-08-24 DIAGNOSIS — D49512 Neoplasm of unspecified behavior of left kidney: Secondary | ICD-10-CM

## 2020-08-24 NOTE — Progress Notes (Signed)
Patient is an established patient of Dr Antonieta Pert seen for history of Lung cancer. 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   Called and spoke to the patient. Reviewed appointment date, time and location with patient. Patient began to share with me all her concerns regarding her daughter being deceptive and trying to take advantage. She stumbles frequently on words stating she has dementia and when she's stressed it gets worse.   She then tells me that she has called both Dr Hendrickson's office and Dr Jackson Latino office because she needs to let them know that she has lung cancer. She says she has CT scans from January 3rd, January 5th, and January 13th which all say she has cancer in her lung. Upon chart review, it appears that patient has been calling different MD offices sine last October 2021 with these same claims. All have tried to reassure her that her CT scans have been free of lung cancer.  I reviewed the CT scan from 07/19/2020 with her and instructed her that there is no evidence of lung cancer. She then goes on to say her primary care MD, Dr Suzy Bouchard, felt that "I was getting obsessed with having cancer so she deleted any mention of cancer in my chart". I tried to explain to the patient that MD's can't delete medical information from her chart. She asked what her scans from 1/3 and 1/5 showed. I told her I didn't see that she had scans that day. She then got upset asking whose scans did she have, and then returned to complaints about her daughter and stating she was a criminal and had evil intentions.   These issues will attempt to be addressed with patient and her husband during her appointment next week.  Oncology Nurse Navigator Documentation  Oncology Nurse Navigator Flowsheets 08/24/2020  Abnormal Finding Date 08/22/2020  Confirmed Diagnosis Date -  Diagnosis Status Additional Work Up  Expected Surgery Date -  Navigator Follow Up  Date: 08/30/2020  Navigator Follow Up Reason: New Patient Appointment  Navigator Location [facility]  Referral Date to RadOnc/MedOnc 08/22/2020  Navigator Encounter Type Introductory Phone Call  Telephone -  Patient Visit Type MedOnc  Treatment Phase Abnormal Scans  Barriers/Navigation Needs Coordination of Care;Education;Family Concerns;Personal Conflicts  Education Other  Interventions Coordination of Care;Education;Psycho-Social Support  Acuity Level 4-High Needs (Greater Than 4 Barriers Identified)  Coordination of Care Appts  Education Method Verbal  Time Spent with Patient 60

## 2020-08-27 ENCOUNTER — Encounter (HOSPITAL_COMMUNITY): Payer: Self-pay

## 2020-08-27 NOTE — Progress Notes (Unsigned)
Leslie Boyd Female, 75 y.o., 04-15-1946  MRN:  [id number] Phone:  [phone] [name])       PCP:  Nicola Girt, DO Coverage:  [place] [employer] Medicare Hmo/Ppo  Next Appt With Radiology ([facility]) 08/31/2020 at 8:00 AM           RE: STAT Biopsy Received: 3 days ago  Message Details  Arne Cleveland, MD  [name]   CT core bx L paraaortic LAN  See CT [id number]   [facility]    Previous Messages  ----- Message -----  From: [name]  Sent: 08/24/2020  4:22 PM EST  To: Ir Procedure Requests  Subject: STAT Biopsy                    Procedure Requested:  left renal and/or para aortic lympn nde biopsy.   Reason for Procedure: concern for metastatic urothelial carcinoma     Provider Requesting: Dr Lovena Neighbours, Loletha Grayer  Provider Telephone: [phone]     Other Info:

## 2020-08-29 ENCOUNTER — Other Ambulatory Visit: Payer: Self-pay | Admitting: Radiology

## 2020-08-30 ENCOUNTER — Other Ambulatory Visit: Payer: Self-pay | Admitting: Radiology

## 2020-08-30 ENCOUNTER — Other Ambulatory Visit: Payer: Self-pay

## 2020-08-30 ENCOUNTER — Inpatient Hospital Stay: Payer: Medicare HMO | Attending: Internal Medicine | Admitting: Hematology & Oncology

## 2020-08-30 ENCOUNTER — Inpatient Hospital Stay: Payer: Medicare HMO

## 2020-08-30 ENCOUNTER — Other Ambulatory Visit: Payer: Self-pay | Admitting: Student

## 2020-08-30 ENCOUNTER — Encounter: Payer: Self-pay | Admitting: Hematology & Oncology

## 2020-08-30 ENCOUNTER — Encounter: Payer: Self-pay | Admitting: *Deleted

## 2020-08-30 DIAGNOSIS — Z79899 Other long term (current) drug therapy: Secondary | ICD-10-CM | POA: Insufficient documentation

## 2020-08-30 DIAGNOSIS — C649 Malignant neoplasm of unspecified kidney, except renal pelvis: Secondary | ICD-10-CM | POA: Diagnosis not present

## 2020-08-30 DIAGNOSIS — Z87891 Personal history of nicotine dependence: Secondary | ICD-10-CM | POA: Insufficient documentation

## 2020-08-30 DIAGNOSIS — N2889 Other specified disorders of kidney and ureter: Secondary | ICD-10-CM | POA: Diagnosis not present

## 2020-08-30 DIAGNOSIS — F0390 Unspecified dementia without behavioral disturbance: Secondary | ICD-10-CM | POA: Insufficient documentation

## 2020-08-30 DIAGNOSIS — C3412 Malignant neoplasm of upper lobe, left bronchus or lung: Secondary | ICD-10-CM | POA: Insufficient documentation

## 2020-08-30 DIAGNOSIS — C3492 Malignant neoplasm of unspecified part of left bronchus or lung: Secondary | ICD-10-CM

## 2020-08-30 HISTORY — DX: Other specified disorders of kidney and ureter: N28.89

## 2020-08-30 LAB — CBC WITH DIFFERENTIAL (CANCER CENTER ONLY)
Abs Immature Granulocytes: 0.08 10*3/uL — ABNORMAL HIGH (ref 0.00–0.07)
Basophils Absolute: 0 10*3/uL (ref 0.0–0.1)
Basophils Relative: 0 %
Eosinophils Absolute: 0.3 10*3/uL (ref 0.0–0.5)
Eosinophils Relative: 2 %
HCT: 32.2 % — ABNORMAL LOW (ref 36.0–46.0)
Hemoglobin: 10 g/dL — ABNORMAL LOW (ref 12.0–15.0)
Immature Granulocytes: 1 %
Lymphocytes Relative: 9 %
Lymphs Abs: 1.3 10*3/uL (ref 0.7–4.0)
MCH: 27 pg (ref 26.0–34.0)
MCHC: 31.1 g/dL (ref 30.0–36.0)
MCV: 86.8 fL (ref 80.0–100.0)
Monocytes Absolute: 0.9 10*3/uL (ref 0.1–1.0)
Monocytes Relative: 7 %
Neutro Abs: 11.5 10*3/uL — ABNORMAL HIGH (ref 1.7–7.7)
Neutrophils Relative %: 81 %
Platelet Count: 436 10*3/uL — ABNORMAL HIGH (ref 150–400)
RBC: 3.71 MIL/uL — ABNORMAL LOW (ref 3.87–5.11)
RDW: 19.5 % — ABNORMAL HIGH (ref 11.5–15.5)
WBC Count: 14.1 10*3/uL — ABNORMAL HIGH (ref 4.0–10.5)
nRBC: 0 % (ref 0.0–0.2)

## 2020-08-30 LAB — CMP (CANCER CENTER ONLY)
ALT: 11 U/L (ref 0–44)
AST: 14 U/L — ABNORMAL LOW (ref 15–41)
Albumin: 4 g/dL (ref 3.5–5.0)
Alkaline Phosphatase: 100 U/L (ref 38–126)
Anion gap: 6 (ref 5–15)
BUN: 22 mg/dL (ref 8–23)
CO2: 29 mmol/L (ref 22–32)
Calcium: 9.7 mg/dL (ref 8.9–10.3)
Chloride: 100 mmol/L (ref 98–111)
Creatinine: 0.97 mg/dL (ref 0.44–1.00)
GFR, Estimated: >60 mL/min (ref >60)
Glucose, Bld: 92 mg/dL (ref 70–99)
Potassium: 4.3 mmol/L (ref 3.5–5.1)
Sodium: 135 mmol/L (ref 135–145)
Total Bilirubin: 0.2 mg/dL — ABNORMAL LOW (ref 0.3–1.2)
Total Protein: 7.3 g/dL (ref 6.5–8.1)

## 2020-08-30 MED ORDER — HYDROCODONE-ACETAMINOPHEN 5-325 MG PO TABS: 1.0000 | ORAL_TABLET | Freq: Four times a day (QID) | ORAL | 0 refills | Status: DC | PRN

## 2020-08-30 NOTE — Progress Notes (Signed)
Initial RN Navigator Patient Visit  Name: Leslie Boyd Date of Referral : 08/22/20 Diagnosis: Renal Mass  Met with patient, and her son, Jenny Reichmann, prior to their visit with MD. Hanley Seamen patient "Your Patient Navigator" handout which explains my role, areas in which I am able to help, and all the contact information for myself and the office. Also gave patient MD and Navigator business card. Reviewed with patient the general overview of expected course after initial diagnosis and time frame for all steps to be completed.  Patient is scheduled for renal biopsy tomorrow. Will review Dr Antonieta Pert appointment and any orders for patient tomorrow.   Patient understands all follow up procedures and expectations. They have my number to reach out for any further clarification or additional needs.   Oncology Nurse Navigator Documentation  Oncology Nurse Navigator Flowsheets 08/30/2020  Abnormal Finding Date -  Confirmed Diagnosis Date -  Diagnosis Status -  Expected Surgery Date -  Navigator Follow Up Date: 08/31/2020  Navigator Follow Up Reason: Appointment Review  Navigator Location [facility]  Referral Date to RadOnc/MedOnc -  Navigator Encounter Type Initial MedOnc  Telephone -  Patient Visit Type MedOnc  Treatment Phase Abnormal Scans  Barriers/Navigation Needs Coordination of Care;Education;Family Concerns;Personal Conflicts  Education Other  Interventions Education;Psycho-Social Support  Acuity Level 4-High Needs (Greater Than 4 Barriers Identified)  Coordination of Care -  Education Method Verbal  Support Groups/Services Friends and Family  Time Spent with Patient 39

## 2020-08-30 NOTE — Progress Notes (Signed)
Hematology and Oncology Follow Up Visit  Leslie Boyd [id number] 1946-01-07 75 y.o. 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  Unfortunately, it looks like we have a new problem now.  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  He was having some hematuria.  She did undergo a CT scan of the abdomen and pelvis.  This was done in early February.  This showed expansion of the left kidney with a mass measuring 7.6 x 7 x 12 cm.  This involves the entire interpolar and left pole of the kidney.  There is necrotic lymph nodes in the left renal hilum.  She has some retroperitoneal lymph nodes.  She has been on the thought that this was from her lung cancer.  I told her that this was not consistent with her lung cancer coming back.  I told her that if her lung cancer came back, it would come back in the chest or in the lymph nodes in the chest.  She is followed by urology.  Dr. Gilford Rile has set up a biopsy for tomorrow.  She is lost quite a bit of weight.  She now is 72 pounds.  Her appetite is somewhat down.  She has had no nausea or vomiting.  She has had no obvious bowel issues.  She says she has occasional blood in the urine.  There is been no fever.  She has had no cough or shortness of breath.  There is been no leg swelling.  She does have an element of dementia.  Her son comes in with her.  I think that she actually does seem to be fairly alert this afternoon.  She does have some pain over on the left side of the abdomen.  Hydrocodone seems to help with this.  I would have to say that overall, her performance status is probably ECOG 2 at best.   Medications:  Current Outpatient Medications:  .  acetaminophen (TYLENOL) 500 MG tablet, Take  1,000 mg by mouth every 8 (eight) hours as needed for moderate pain., Disp: , Rfl:  .  amLODipine-atorvastatin (CADUET) 5-20 MG tablet, Take 1 tablet by mouth daily., Disp: , Rfl:  .  famotidine (PEPCID) 20 MG tablet, Take 20 mg by mouth 2 (two) times daily., Disp: , Rfl:  .  HYDROcodone-acetaminophen (NORCO/VICODIN) 5-325 MG tablet, Take 1 tablet by mouth every 6 (six) hours as needed for moderate pain., Disp: , Rfl:  .  LORazepam (ATIVAN) 0.5 MG tablet, Take 0.5 mg by mouth 2 (two) times daily., Disp: , Rfl:  .  mirtazapine (REMERON) 15 MG tablet, Take 15 mg by mouth at bedtime., Disp: , Rfl:  .  OLANZapine (ZYPREXA) 7.5 MG tablet, Take 7.5 mg by mouth daily., Disp: , Rfl:  .  sertraline (ZOLOFT) 100 MG tablet, Take 100 mg by mouth daily., Disp: , Rfl:  .  Vitamin D, Ergocalciferol, (DRISDOL) 1.25 MG (50000 UNIT) CAPS capsule, Take 50,000 Units by mouth every 7 (seven) days., Disp: , Rfl:   Allergies:  Allergies  Allergen Reactions  . Ondansetron Hcl Other (See Comments)    Excessive sweating  . Morphine Nausea And Vomiting  . Morphine And Related Nausea And Vomiting and Rash  Past Medical History, Surgical history, Social history, and Family History were reviewed and updated.  Review of Systems: Review of Systems  Constitutional: Negative.   HENT:  Negative.   Eyes: Negative.   Respiratory: Negative.   Cardiovascular: Positive for chest pain.  Gastrointestinal: Negative.   Endocrine: Negative.   Genitourinary: Negative.    Musculoskeletal: Negative.   Skin: Negative.   Neurological: Negative.   Hematological: Negative.   Psychiatric/Behavioral: Negative.     Physical Exam:  weight is 79 lb (35.8 kg). Her oral temperature is 98 F (36.7 C). Her blood pressure is 116/50 (abnormal) and her pulse is 82. Her respiration is 20 and oxygen saturation is 100%.   Wt Readings from Last 3 Encounters:  08/30/20 79 lb (35.8 kg)  03/02/19 92 lb 1.9 oz (41.8 kg)  07/22/18 92 lb  (41.7 kg)    Physical Exam Vitals reviewed.  HENT:     Head: Normocephalic and atraumatic.  Eyes:     Pupils: Pupils are equal, round, and reactive to light.  Cardiovascular:     Rate and Rhythm: Normal rate and regular rhythm.     Heart sounds: Normal heart sounds.  Pulmonary:     Effort: Pulmonary effort is normal.     Breath sounds: Normal breath sounds.  Abdominal:     General: Bowel sounds are normal.     Palpations: Abdomen is soft.  Musculoskeletal:        General: No tenderness or deformity. Normal range of motion.     Cervical back: Normal range of motion.  Lymphadenopathy:     Cervical: No cervical adenopathy.  Skin:    General: Skin is warm and dry.     Findings: No erythema or rash.  Neurological:     Mental Status: She is alert and oriented to person, place, and time.  Psychiatric:        Behavior: Behavior normal.        Thought Content: Thought content normal.        Judgment: Judgment normal.      Lab Results  Component Value Date   WBC 14.1 (H) 08/30/2020   HGB 10.0 (L) 08/30/2020   HCT 32.2 (L) 08/30/2020   MCV 86.8 08/30/2020   PLT 436 (H) 08/30/2020     Chemistry      Component Value Date/Time   NA 135 08/30/2020 1448   K 4.3 08/30/2020 1448   CL 100 08/30/2020 1448   CO2 29 08/30/2020 1448   BUN 22 08/30/2020 1448   CREATININE 0.97 08/30/2020 1448      Component Value Date/Time   CALCIUM 9.7 08/30/2020 1448   ALKPHOS 100 08/30/2020 1448   AST 14 (L) 08/30/2020 1448   ALT 11 08/30/2020 1448   BILITOT 0.2 (L) 08/30/2020 1448      Impression and Plan: Leslie Boyd is a 75 year old white female.  She has a past history of a stage I non-small cell lung cancer of the left lung.  She has resected back in October 2019.  She now has a large left kidney mass with adenopathy.  I have to believe that this is going to end up being a renal cell carcinoma.  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  Again she is going to  have a biopsy tomorrow.  As far as treatment goes for her, her weight is can be a real problem.  Hopefully she will be able to gain a little bit of  weight.  I would think that if this is renal cell carcinoma, our only option is going to be immunotherapy.  Possibly, combination of immunotherapy/TKI could be considered although this would have significant toxicity given her performance status.  I spent a good hour with she and her son.  Again she seemed to be more alert and oriented than I would have thought.  I am glad about this.  I told her that this was not her lung cancer that is come back.  I told her that I really thought that she would have a second kind of cancer.  Once we get the biopsy, we will then be able to get her in so we can figure out how we might be able to help this.   [name], MD 2/24/20225:29 PM

## 2020-08-30 NOTE — H&P (Addendum)
Chief Complaint: Patient was seen in consultation today for a renal mass biopsy  Referring Physician(s): Ceasar Mons  Supervising Physician: Markus Daft  Patient Status: [facility] - Out-pt  History of Present Illness: Leslie Boyd is a 75 y.o. 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 She presented to her doctor with complaints of left flank and left lower quadrant pain. Imaging was ordered.    CT Abdomen/Pelvis 08/21/20 IMPRESSION: 1. Infiltrative masslike appearance of the LEFT kidney worse in the interpolar and lower pole portions of the kidney associated with necrotic retroperitoneal lymphadenopathy. Subtle enlargement of LEFT retrocrural lymph node also warrants attention on subsequent imaging. Likely urothelial carcinoma with nodal disease in the retroperitoneum. 2. Signs of nephrolithiasis. 3. Minimal if any excretion from the LEFT kidney. 4. Signs of pulmonary emphysema. 5. Emphysema and aortic atherosclerosis.  Interventional Radiology has been asked to evaluate this patient for an image-guided renal mass biopsy for further work up. Imaging has been reviewed by and procedure approved by Dr. Kathlene Cote.   Past Medical History:  Diagnosis Date  . Anxiety   . Arthritis   . Coronary artery calcification seen on CT scan    cardiologist-- dr Atilano Median [facility] in [facility])  . GERD (gastroesophageal reflux disease)   . History of kidney stones    ureter stent in place  . History of stress test    stress echo 01-29-2018 with dr Atilano Median, cardiologist with [facility] in [facility]  . Hypertension   . Neoplasm of uncertain behavior of left upper lobe of lung    followed by pulmologist-- dr Duane [facility] in [facility])  . Osteopenia   . Renal lesion    left renal pelvis  . Renal mass, left 08/30/2020    Past Surgical History:  Procedure Laterality Date  . CATARACT EXTRACTION W/ INTRAOCULAR LENS   IMPLANT, BILATERAL  2016  . [place]  . CYSTOSCOPY WITH RETROGRADE PYELOGRAM, URETEROSCOPY AND STENT PLACEMENT Left 04/27/2018   Procedure: CYSTOSCOPY WITH BILATERAL RETROGRADE PYELOGRAM, POSSIBLE LEFT URETEROSCOPY AND POSSIBLE STENT PLACEMENT;  Surgeon: Ceasar Mons, MD;  Location: [facility];  Service: Urology;  Laterality: Left;  . SKIN GRAFT    . TONSILLECTOMY  1963  . VIDEO ASSISTED THORACOSCOPY (VATS)/WEDGE RESECTION Left 05/03/2018   Procedure: VIDEO ASSISTED THORACOSCOPY (VATS)/WEDGE RESECTION;  Surgeon: [name], MD;  Location: [place];  Service: Thoracic;  Laterality: Left;    Allergies: Ondansetron hcl, Morphine, and Morphine and related  Medications: Prior to Admission medications   Medication Sig Start Date End Date Taking? Authorizing Provider  acetaminophen (TYLENOL) 500 MG tablet Take 1,000 mg by mouth every 8 (eight) hours as needed for moderate pain.   Yes [provider]  amLODipine-atorvastatin (CADUET) 5-20 MG tablet Take 1 tablet by mouth daily.   Yes [provider]  famotidine (PEPCID) 20 MG tablet Take 20 mg by mouth 2 (two) times daily. 02/27/19  Yes [provider]  HYDROcodone-acetaminophen (NORCO/VICODIN) 5-325 MG tablet Take 1 tablet by mouth every 6 (six) hours as needed for moderate pain.   Yes [provider]  LORazepam (ATIVAN) 0.5 MG tablet Take 0.5 mg by mouth 2 (two) times daily. 06/22/18  Yes [provider]  mirtazapine (REMERON) 15 MG tablet Take 15 mg by mouth at bedtime. 11/03/19  Yes [provider]  OLANZapine (ZYPREXA) 7.5 MG tablet Take 7.5 mg by mouth  daily. 12/16/19  Yes [provider]  sertraline (ZOLOFT) 100 MG tablet Take 100 mg by mouth daily. 12/16/19  Yes [provider]  Vitamin D, Ergocalciferol, (DRISDOL) 1.25 MG (50000 UNIT) CAPS capsule Take 50,000 Units by mouth every 7 (seven) days.   Yes [provider]  nicotine (NICODERM CQ - DOSED IN MG/24 HOURS) 14 mg/24hr patch Place 1 patch (14 mg total) onto the skin daily. Patient not taking: No sig reported 05/07/18   Nani Skillern, PA-C     Family History  Problem Relation Age of Onset  . Heart disease Father   . COPD Sister   . Heart disease Brother   . Colon cancer Neg Hx     Social History   Socioeconomic History  . Marital status: Married    Spouse name: Not on file  . Number of children: Not on file  . Years of education: Not on file  . Highest education level: Not on file  Occupational History  . Not on file  Tobacco Use  . Smoking status: Current Every Day Smoker    Packs/day: 0.50    Years: 53.00    Pack years: 26.50    Types: Cigarettes  . Smokeless tobacco: Never Used  . Tobacco comment: smokes 4 cigarettes a day  Vaping Use  . Vaping Use: Never used  Substance and Sexual Activity  . Alcohol use: Not Currently    Comment: occ  . Drug use: No  . Sexual activity: Yes  Other Topics Concern  . Not on file  Social History Narrative  . Not on file   Social Determinants of Health   Financial Resource Strain: Not on file  Food Insecurity: Not on file  Transportation Needs: Not on file  Physical Activity: Not on file  Stress: Not on file  Social Connections: Not on file    Review of Systems: A 12 point ROS discussed and pertinent positives are indicated in the HPI above.  All other systems are negative.  Review of Systems  Constitutional: Positive for fatigue. Negative for appetite change.  Respiratory: Negative for cough and shortness of breath.   Cardiovascular: Negative for chest pain and leg swelling.  Gastrointestinal: Negative for abdominal pain, diarrhea, nausea and vomiting.  Genitourinary: Positive for flank pain.  Musculoskeletal: Positive for back pain.  Neurological: Negative for headaches.    Vital Signs: BP 132/66   Pulse 79   Temp 98.2 F (36.8 C) (Oral)   Resp 16    Ht 4' 11.5" (1.511 m)   Wt 80 lb (36.3 kg)   SpO2 98%   BMI 15.89 kg/m   Physical Exam Constitutional:      General: She is not in acute distress.    Appearance: She is underweight.  HENT:     Mouth/Throat:     Mouth: Mucous membranes are moist.     Pharynx: Oropharynx is clear.     Comments: Full dentures upper; partial dentures lower Cardiovascular:     Rate and Rhythm: Normal rate and regular rhythm.  Pulmonary:     Effort: Pulmonary effort is normal.  Abdominal:     General: Bowel sounds are normal.     Palpations: Abdomen is soft.     Tenderness: There is no abdominal tenderness.     Comments: Left flank and back pain   Skin:    General: Skin is warm and dry.  Neurological:     Mental Status: She is alert and oriented to  person, place, and time.  Psychiatric:        Mood and Affect: Mood is anxious.        Behavior: Behavior normal.        Thought Content: Thought content normal.        Cognition and Memory: Cognition normal.        Judgment: Judgment normal.     Imaging: No results found.  Labs:  CBC: Recent Labs    12/22/19 1132 08/30/20 1448 08/31/20 0615  WBC 12.1* 14.1* 15.9*  HGB 11.7* 10.0* 10.9*  HCT 36.6 32.2* 35.2*  PLT 348 436* 522*    COAGS: Recent Labs    08/31/20 0615  INR 1.0    BMP: Recent Labs    12/22/19 1132 08/30/20 1448  NA 141 135  K 4.8 4.3  CL 102 100  CO2 33* 29  GLUCOSE 137* 92  BUN 25* 22  CALCIUM 10.2 9.7  CREATININE 1.13* 0.97  GFRNONAA 47* >60  GFRAA 55*  --     LIVER FUNCTION TESTS: Recent Labs    12/22/19 1132 08/30/20 1448  BILITOT 0.2* 0.2*  AST 17 14*  ALT 23 11  ALKPHOS 92 100  PROT 6.9 7.3  ALBUMIN 4.4 4.0    TUMOR MARKERS: No results for input(s): AFPTM, CEA, CA199, CHROMGRNA in the last 8760 hours.  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   Risks and benefits of this procedure were discussed with the patient and/or patient's family including, but not limited to bleeding, infection, damage to adjacent structures or low yield requiring additional tests.  All of the questions were answered and there is agreement to proceed. She has been NPO. Labs and vitals have been reviewed.   Consent signed and in chart.  Thank you for this interesting consult.  I greatly enjoyed meeting DANEKA LANTIGUA and look forward to participating in their care.  A copy of this report was sent to the requesting provider on this date.  Electronically Signed: Soyla Dryer, AGACNP-BC [phone] 08/31/2020, 7:34 AM   I spent a total of  30 Minutes   in face to face in clinical consultation, greater than 50% of which was counseling/coordinating care for image-guided para-aortic lymph node biopsy

## 2020-08-31 ENCOUNTER — Encounter: Payer: Self-pay | Admitting: Nutrition

## 2020-08-31 ENCOUNTER — Ambulatory Visit (HOSPITAL_COMMUNITY)
Admission: RE | Admit: 2020-08-31 | Discharge: 2020-08-31 | Disposition: A | Discharge status: Home / Self Care | Payer: Medicare HMO | Source: Ambulatory Visit | Attending: Urology | Admitting: Urology

## 2020-08-31 ENCOUNTER — Telehealth: Payer: Self-pay | Admitting: *Deleted

## 2020-08-31 ENCOUNTER — Encounter: Payer: Self-pay | Admitting: *Deleted

## 2020-08-31 ENCOUNTER — Encounter (HOSPITAL_COMMUNITY): Payer: Self-pay

## 2020-08-31 DIAGNOSIS — N280 Ischemia and infarction of kidney: Secondary | ICD-10-CM | POA: Insufficient documentation

## 2020-08-31 DIAGNOSIS — D49512 Neoplasm of unspecified behavior of left kidney: Secondary | ICD-10-CM

## 2020-08-31 LAB — CBC
HCT: 35.2 % — ABNORMAL LOW (ref 36.0–46.0)
Hemoglobin: 10.9 g/dL — ABNORMAL LOW (ref 12.0–15.0)
MCH: 26.9 pg (ref 26.0–34.0)
MCHC: 31 g/dL (ref 30.0–36.0)
MCV: 86.9 fL (ref 80.0–100.0)
Platelets: 522 10*3/uL — ABNORMAL HIGH (ref 150–400)
RBC: 4.05 MIL/uL (ref 3.87–5.11)
RDW: 19.7 % — ABNORMAL HIGH (ref 11.5–15.5)
WBC: 15.9 10*3/uL — ABNORMAL HIGH (ref 4.0–10.5)
nRBC: 0 % (ref 0.0–0.2)

## 2020-08-31 LAB — PROTIME-INR
INR: 1 (ref 0.8–1.2)
Prothrombin Time: 13.1 seconds (ref 11.4–15.2)

## 2020-08-31 MED ORDER — GELATIN ABSORBABLE 12-7 MM EX MISC
CUTANEOUS | Status: AC
  Filled 2020-08-31: qty 1

## 2020-08-31 MED ORDER — LIDOCAINE HCL (PF) 1 % IJ SOLN
INTRAMUSCULAR | Status: AC
  Filled 2020-08-31: qty 30

## 2020-08-31 MED ORDER — FENTANYL CITRATE (PF) 100 MCG/2ML IJ SOLN
INTRAMUSCULAR | Status: AC
  Filled 2020-08-31: qty 2

## 2020-08-31 MED ORDER — FENTANYL CITRATE (PF) 100 MCG/2ML IJ SOLN
INTRAMUSCULAR | Status: AC | PRN
  Administered 2020-08-31: 12.5 ug via INTRAVENOUS

## 2020-08-31 MED ORDER — MIDAZOLAM HCL 2 MG/2ML IJ SOLN
INTRAMUSCULAR | Status: AC
  Filled 2020-08-31: qty 2

## 2020-08-31 MED ORDER — SODIUM CHLORIDE 0.9 % IV SOLN
INTRAVENOUS | Status: AC | PRN
  Administered 2020-08-31: 10 mL/h via INTRAVENOUS

## 2020-08-31 MED ORDER — SODIUM CHLORIDE 0.9 % IV SOLN: INTRAVENOUS | Status: DC

## 2020-08-31 MED ORDER — ACETAMINOPHEN 325 MG PO TABS
650.0000 mg | ORAL_TABLET | Freq: Once | ORAL | Status: AC
  Administered 2020-08-31: 650 mg via ORAL
  Filled 2020-08-31: qty 2

## 2020-08-31 MED ORDER — ACETAMINOPHEN 325 MG PO TABS
ORAL_TABLET | ORAL | Status: AC
  Filled 2020-08-31: qty 2

## 2020-08-31 MED ORDER — MIDAZOLAM HCL 2 MG/2ML IJ SOLN
INTRAMUSCULAR | Status: AC | PRN
  Administered 2020-08-31: 0.5 mg via INTRAVENOUS

## 2020-08-31 NOTE — Discharge Instructions (Addendum)
Percutaneous Kidney Biopsy, Care After This sheet gives you information about how to care for yourself after your procedure. Your health care provider may also give you more specific instructions. If you have problems or questions, contact your health care provider. What can I expect after the procedure? After the procedure, it is common to have:  Pain or soreness near the biopsy site.  Pink or cloudy urine for 24 hours after the procedure. This is normal. Follow these instructions at home: Activity  Return to your normal activities as told by your health care provider. Ask your health care provider what activities are safe for you.  If you were given a sedative during the procedure, it can affect you for several hours. Do not drive or operate machinery until your health care provider says that it is safe.  Do not lift anything that is heavier than 10 lb (4.5 kg), or the limit that you are told, until your health care provider says that it is safe.  Avoid activities that take a lot of effort until your health care provider approves. Most people will have to wait 2 weeks before returning to activities such as exercise or sex. General instructions  Take over-the-counter and prescription medicines only as told by your health care provider.  Follow instructions from your health care provider about eating or drinking restrictions.  Check your biopsy site every day for signs of infection. Check for: ? More redness, swelling, or pain. ? Fluid or blood. ? Warmth. ? Pus or a bad smell.  Keep all follow-up visits as told by your health care provider. This is important.   Contact a health care provider if:  You have more redness, swelling, or pain around your biopsy site.  You have fluid or blood coming from your biopsy site.  Your biopsy site feels warm to the touch.  You have pus or a bad smell coming from your biopsy site.  You have blood in your urine more than 24 hours after your  procedure. Get help right away if:  Your urine is dark red or brown.  You have a fever.  You are not able to urinate.  You feel burning when you urinate.  You feel dizzy or light-headed.  You have severe pain in your abdomen or side. Summary  After the procedure, it is common to have pain or soreness at the biopsy site and pink or cloudy urine for the first 24 hours.  Check your biopsy site each day for signs of infection, such as more redness, swelling, or pain; fluid, blood, pus or a bad smell coming from the biopsy site; or the biopsy site feeling warm to the touch.  Return to your normal activities as told by your health care provider. This information is not intended to replace advice given to you by your health care provider. Make sure you discuss any questions you have with your health care provider. Document Revised: 09/16/2019 Document Reviewed: 09/16/2019 Elsevier Patient Education  2021 Elsevier Inc. Moderate Conscious Sedation, Adult Sedation is the use of medicines to promote relaxation and to relieve discomfort and anxiety. Moderate conscious sedation is a type of sedation. Under moderate conscious sedation, you are less alert than normal, but you are still able to respond to instructions, touch, or both. Moderate conscious sedation is used during short medical and dental procedures. It is milder than deep sedation, which is a type of sedation under which you cannot be easily woken up. It is also milder than   general anesthesia, which is the use of medicines to make you unconscious. Moderate conscious sedation allows you to return to your regular activities sooner. Tell a health care provider about:  Any allergies you have.  All medicines you are taking, including vitamins, herbs, eye drops, creams, and over-the-counter medicines.  Any use of steroids. This includes steroids taken by mouth or as a cream.  Any problems you or family members have had with sedatives and  anesthetic medicines.  Any blood disorders you have.  Any surgeries you have had.  Any medical conditions you have, such as sleep apnea.  Whether you are pregnant or may be pregnant.  Any use of cigarettes, alcohol, marijuana, or drugs. What are the risks? Generally, this is a safe procedure. However, problems may occur, including:  Getting too much medicine (oversedation).  Nausea.  Allergic reaction to medicines.  Trouble breathing. If this happens, a breathing tube may be used. It will be removed when you are awake and breathing on your own.  Heart trouble.  Lung trouble.  Confusion that gets better with time (emergence delirium). What happens before the procedure? Staying hydrated Follow instructions from your health care provider about hydration, which may include:  Up to 2 hours before the procedure - you may continue to drink clear liquids, such as water, clear fruit juice, black coffee, and plain tea. Eating and drinking restrictions Follow instructions from your health care provider about eating and drinking, which may include:  8 hours before the procedure - stop eating heavy meals or foods, such as meat, fried foods, or fatty foods.  6 hours before the procedure - stop eating light meals or foods, such as toast or cereal.  6 hours before the procedure - stop drinking milk or drinks that contain milk.  2 hours before the procedure - stop drinking clear liquids. Medicines Ask your health care provider about:  Changing or stopping your regular medicines. This is especially important if you are taking diabetes medicines or blood thinners.  Taking medicines such as aspirin and ibuprofen. These medicines can thin your blood. Do not take these medicines unless your health care provider tells you to take them.  Taking over-the-counter medicines, vitamins, herbs, and supplements. Tests and exams  You will have a physical exam.  You may have blood tests done to  show how well: ? Your kidneys and liver work. ? Your blood clots. General instructions  Plan to have a responsible adult take you home from the hospital or clinic.  If you will be going home right after the procedure, plan to have a responsible adult care for you for the time you are told. This is important. What happens during the procedure?  You will be given the sedative. The sedative may be given: ? As a pill that you will swallow. It can also be inserted into the rectum. ? As a spray through the nose. ? As an injection into the muscle. ? As an injection into the vein through an IV.  You may be given oxygen as needed.  Your breathing, heart rate, and blood pressure will be monitored during the procedure.  The medical or dental procedure will be done. The procedure may vary among health care providers and hospitals.   What happens after the procedure?  Your blood pressure, heart rate, breathing rate, and blood oxygen level will be monitored until you leave the hospital or clinic.  You will get fluids through your IV if needed.  Do not drive   or operate machinery until your health care provider says that it is safe. Summary  Sedation is the use of medicines to promote relaxation and to relieve discomfort and anxiety. Moderate conscious sedation is a type of sedation that is used during short medical and dental procedures.  Tell the health care provider about any medical conditions that you have and about all the medicines that you are taking.  You will be given the sedative as a pill, a spray through the nose, an injection into the muscle, or an injection into the vein through an IV. Vital signs are monitored during the sedation.  Moderate conscious sedation allows you to return to your regular activities sooner. This information is not intended to replace advice given to you by your health care provider. Make sure you discuss any questions you have with your health care  provider. Document Revised: 10/21/2019 Document Reviewed: 05/19/2019 Elsevier Patient Education  2021 Elsevier Inc.  

## 2020-08-31 NOTE — Progress Notes (Signed)
No further needs at this time. Having biopsy this morning. Will follow up next week when pathology is resulted.  Oncology Nurse Navigator Documentation  Oncology Nurse Navigator Flowsheets 08/31/2020  Abnormal Finding Date -  Confirmed Diagnosis Date -  Diagnosis Status -  Expected Surgery Date -  Navigator Follow Up Date: 09/04/2020  Navigator Follow Up Reason: Pathology  Navigator Location [facility]  Referral Date to RadOnc/MedOnc -  Navigator Encounter Type Appt/Treatment Plan Review  Telephone -  Patient Visit Type MedOnc  Treatment Phase Abnormal Scans  Barriers/Navigation Needs Coordination of Care;Education;Family Concerns;Personal Conflicts  Education -  Interventions None Required  Acuity Level 4-High Needs (Greater Than 4 Barriers Identified)  Coordination of Care -  Education Method -  Support Groups/Services Friends and Family  Time Spent with Patient 30

## 2020-08-31 NOTE — Telephone Encounter (Signed)
No los 08/30/20 to schedule

## 2020-08-31 NOTE — Procedures (Signed)
Interventional Radiology Procedure:   Indications: Left renal mass  Procedure: US guided left renal mass biopsy  Findings: Large left renal mass.  3 cores obtained from lower pole.  Gelfoam slurry injected along biopsy tract.  No immediate bleeding or hematoma.   Complications: None     EBL: less than 10 ml  Plan: Strict bedrest 3 hours   Abigal Choung R. Anselm Pancoast, MD  Pager: [phone]

## 2020-08-31 NOTE — Sedation Documentation (Signed)
Attempt made x 1 to call report to RN in Short Stay. Per Caryl Pina in [place] Stay, will call back when room is available.

## 2020-08-31 NOTE — Progress Notes (Signed)
C/o back pain same as usual, Dr Jeronimo Norma called, PA rx tylenol at patient request, pt states she is nervous due to results and family issues, pt calm after talking and tylenol

## 2020-08-31 NOTE — Progress Notes (Signed)
Paged to short stay for patient with back pain.  Per RN, patient describes as her typical cancer-related pain.  No vital sign changes.  Patient otherwise feeling well and with no additional complaints.  Tylenol 650 mg ordered.   Brynda Greathouse, MS RD PA-C

## 2020-08-31 NOTE — Progress Notes (Signed)
Provided one complimentary case of ensure enlive. 

## 2020-09-03 ENCOUNTER — Encounter: Payer: Self-pay | Admitting: *Deleted

## 2020-09-03 LAB — SURGICAL PATHOLOGY

## 2020-09-03 NOTE — Progress Notes (Signed)
Biopsy results sent to Dr Marin Olp for review.  Oncology Nurse Navigator Documentation  Oncology Nurse Navigator Flowsheets 09/03/2020  Abnormal Finding Date -  Confirmed Diagnosis Date -  Diagnosis Status -  Expected Surgery Date -  Navigator Follow Up Date: 09/05/2020  Navigator Follow Up Reason: Appointment Review  Navigator Location [facility]  Referral Date to RadOnc/MedOnc -  Navigator Encounter Type Pathology Review  Telephone -  Patient Visit Type MedOnc  Treatment Phase Abnormal Scans  Barriers/Navigation Needs Coordination of Care;Education;Family Concerns;Personal Conflicts  Education -  Interventions None Required  Acuity Level 4-High Needs (Greater Than 4 Barriers Identified)  Coordination of Care -  Education Method -  Support Groups/Services Friends and Family  Time Spent with Patient 30

## 2020-09-05 ENCOUNTER — Other Ambulatory Visit: Payer: Self-pay | Admitting: *Deleted

## 2020-09-05 ENCOUNTER — Other Ambulatory Visit: Payer: Self-pay | Admitting: Hematology & Oncology

## 2020-09-05 DIAGNOSIS — N2889 Other specified disorders of kidney and ureter: Secondary | ICD-10-CM

## 2020-09-05 MED ORDER — HYDROCODONE-ACETAMINOPHEN 5-325 MG PO TABS: 1.0000 | ORAL_TABLET | Freq: Four times a day (QID) | ORAL | 0 refills | Status: DC | PRN

## 2020-09-06 ENCOUNTER — Encounter (HOSPITAL_COMMUNITY): Payer: Self-pay | Admitting: Radiology

## 2020-09-06 ENCOUNTER — Telehealth: Payer: Self-pay | Admitting: *Deleted

## 2020-09-06 ENCOUNTER — Encounter: Payer: Self-pay | Admitting: *Deleted

## 2020-09-06 NOTE — Progress Notes (Signed)
Teryl Lucy Female, 75 y.o., 07/22/1945  MRN:  [id number] Phone:  [phone] [name])       PCP:  Nicola Girt, DO Primary Cvg:  [place] Medicare/[employer] Medicare Hmo/Ppo            RE: CT Biopsy Received: Today Suttle, Rosanne Ashing, MD  Garth Bigness D  Approved for repeat ultrasound guided left renal biopsy. Recommend sampling peripheral aspect of mass where some cortex still remains on CT as central aspect will likely yield only necrotic debris again.   Dylan        Previous Messages   ----- Message -----  From: Garth Bigness D  Sent: 09/06/2020 12:07 PM EST  To: Ir Procedure Requests  Subject: CT Biopsy                     Procedure: CT Biopsy   Reason: Renal mass, left, Large left renal mass. Initial biopsy with mostly necrosis. We need another biopsy for tissue diagnosis   History: US Kidney Bx and CT in computer   Provider: [name]   Provider Contact: [phone]

## 2020-09-06 NOTE — Telephone Encounter (Signed)
Received a call from patient son Jenny Reichmann stating that his mother has had some bleeding when she urinates.  Has been distressing to her. Dr Marin Olp notified.  Dr Marin Olp wants patient to call urologist who she sees to let them know this.  John notified of above.

## 2020-09-06 NOTE — Progress Notes (Signed)
Per Dr Marin Olp:  I called her son and left a message on his cell phone. I told him that the biopsy did not give [place] the diagnosis. Unfortunately, we are going to have to do another biopsy on her. Again we really have to know what kind of tumor we are dealing with. I hate that we have to do another biopsy but we really have no choice if we are going to try to treat her properly.   I will set that the biopsy up. I told him that if there were any questions that he had he can always give [place] a call back. Laurey Arrow   Order for CT biopsy has been placed. Radiology will review scans and schedule. Will follow to ensure biopsy has been scheduled.   Oncology Nurse Navigator Documentation  Oncology Nurse Navigator Flowsheets 09/06/2020  Abnormal Finding Date -  Confirmed Diagnosis Date -  Diagnosis Status -  Expected Surgery Date -  Navigator Follow Up Date: 09/11/2020  Navigator Follow Up Reason: Appointment Review  Navigator Location [facility]  Referral Date to RadOnc/MedOnc -  Navigator Encounter Type Appt/Treatment Plan Review  Telephone -  Patient Visit Type MedOnc  Treatment Phase Abnormal Scans  Barriers/Navigation Needs Coordination of Care;Education;Family Concerns;Personal Conflicts  Education -  Interventions None Required  Acuity Level 4-High Needs (Greater Than 4 Barriers Identified)  Coordination of Care -  Education Method -  Support Groups/Services Friends and Family  Time Spent with Patient 15

## 2020-09-07 ENCOUNTER — Encounter: Payer: Self-pay | Admitting: *Deleted

## 2020-09-07 NOTE — Progress Notes (Signed)
Patient saw Dr Alinda Money today for hematuria and expressed some concerns, which Dr Alinda Money requested we follow up on. Called patient and she once again asks many questions about CT scans or visits done on 07/09/20 and 07/11/20. Reviewed with her that she didn't see any [facility] facility those days. She again claims her PCP has been deleting files from her MyChart. I attempted to reorient the patient and explain that this didn't happen, and that shes had this confusion in the past, when I reviewed the medical record with her. Patient has known dementia diagnosis and the dates of 07/09/20 and 07/11/20 seem to be a frequent concern. She then asked who she could call for medical records. I tried to explain that there are no medical records in the [facility] system for those dates, and if she is certain she has scans on those dates, she would need to contact the facility when they were done. She then asked about the results of Dr Laney Pastor visit today. I explained that office would call with her lab results. She thanked me.  Oncology Nurse Navigator Documentation  Oncology Nurse Navigator Flowsheets 09/07/2020  Abnormal Finding Date -  Confirmed Diagnosis Date -  Diagnosis Status -  Expected Surgery Date -  Navigator Follow Up Date: -  Navigator Follow Up Reason: -  Navigator Location [facility]  Referral Date to RadOnc/MedOnc -  Navigator Encounter Type Telephone  Telephone Incoming Call  Patient Visit Type MedOnc  Treatment Phase Abnormal Scans  Barriers/Navigation Needs Coordination of Care;Education;Family Concerns;Personal Conflicts  Education Other  Interventions Education;Psycho-Social Support  Acuity Level 4-High Needs (Greater Than 4 Barriers Identified)  Coordination of Care -  Education Method Verbal  Support Groups/Services Friends and Family  Time Spent with Patient 68

## 2020-09-07 NOTE — Progress Notes (Signed)
Oncology Nurse Navigator Documentation  Oncology Nurse Navigator Flowsheets 09/07/2020  Abnormal Finding Date -  Confirmed Diagnosis Date -  Diagnosis Status -  Expected Surgery Date -  Navigator Follow Up Date: 09/20/2020  Navigator Follow Up Reason: Other:  Navigator Location [facility]  Referral Date to RadOnc/MedOnc -  Navigator Encounter Type Appt/Treatment Plan Review  Telephone -  Patient Visit Type MedOnc  Treatment Phase Abnormal Scans  Barriers/Navigation Needs Coordination of Care;Education;Family Concerns;Personal Conflicts  Education -  Interventions None Required  Acuity Level 4-High Needs (Greater Than 4 Barriers Identified)  Coordination of Care -  Education Method -  Support Groups/Services Friends and Family  Time Spent with Patient 15

## 2020-09-19 ENCOUNTER — Other Ambulatory Visit: Payer: Self-pay | Admitting: Student

## 2020-09-19 NOTE — H&P (Signed)
Chief Complaint: Patient was seen in consultation today for renal mass biopsy  Referring Physician(s): [name]  Supervising Physician: Markus Daft  Patient Status: [facility] - Out-pt  History of Present Illness: Leslie Boyd is a 75 y.o. 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 She presented to her doctor with complaints of left flank and left lower quadrant pain. CT abdomen/pelvis 08/21/20 showed, "Infiltrative masslike appearance of the LEFT kidney worse in the interpolar and lower pole portions of the kidney associated with necrotic retroperitoneal lymphadenopathy. Subtle enlargement of LEFT retrocrural lymph node also warrants attention on subsequent imaging. 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 Although the findings are  not diagnostic the latter finding raises the possibility of squamous  differentiation in a urothelial carcinoma."   Interventional Radiology has been asked to evaluate this patient for a repeat left renal mass biopsy. Imaging was reviewed and procedure approved by Dr. Serafina Royals.    Past Medical History:  Diagnosis Date  . Anxiety   . Arthritis   . Coronary artery calcification seen on CT scan    cardiologist-- dr Atilano Median [facility] in [facility])  . GERD (gastroesophageal reflux disease)   . History of kidney stones    ureter stent in place  . History of stress test    stress echo 01-29-2018 with dr Atilano Median, cardiologist with [facility] in [facility]  . Hypertension   . Neoplasm of uncertain behavior of left upper lobe of lung    followed by pulmologist-- dr Duane Boston [facility] in  [facility])  . Osteopenia   . Renal lesion    left renal pelvis  . Renal mass, left 08/30/2020    Past Surgical History:  Procedure Laterality Date  . CATARACT EXTRACTION W/ INTRAOCULAR LENS  IMPLANT, BILATERAL  2016  . [place]  . CYSTOSCOPY WITH RETROGRADE PYELOGRAM, URETEROSCOPY AND STENT PLACEMENT Left 04/27/2018   Procedure: CYSTOSCOPY WITH BILATERAL RETROGRADE PYELOGRAM, POSSIBLE LEFT URETEROSCOPY AND POSSIBLE STENT PLACEMENT;  Surgeon: Ceasar Mons, MD;  Location: [facility];  Service: Urology;  Laterality: Left;  . SKIN GRAFT    . TONSILLECTOMY  1963  . VIDEO ASSISTED THORACOSCOPY (VATS)/WEDGE RESECTION Left 05/03/2018   Procedure: VIDEO ASSISTED THORACOSCOPY (VATS)/WEDGE RESECTION;  Surgeon: [name], MD;  Location: [place];  Service: Thoracic;  Laterality: Left;    Allergies: Ondansetron hcl, Morphine, and Morphine and related  Medications: Prior to Admission medications   Medication Sig Start Date End Date Taking? Authorizing Provider  acetaminophen (TYLENOL) 500 MG tablet Take 1,000 mg by mouth every 8 (eight) hours as needed for moderate pain.    [provider]  amLODipine-atorvastatin (CADUET) 5-20 MG tablet Take 1 tablet by mouth daily.    [provider]  famotidine (PEPCID) 20 MG tablet Take 20 mg by mouth 2 (two) times daily. 02/27/19   [provider]  HYDROcodone-acetaminophen (NORCO/VICODIN) 5-325 MG tablet Take 1 tablet by mouth every 6 (six) hours as needed for moderate pain. 09/05/20   [name], MD  LORazepam (ATIVAN) 0.5 MG tablet Take 0.5 mg by mouth 2 (two) times daily. 06/22/18  [provider]  mirtazapine (REMERON) 15 MG tablet Take 15 mg by mouth at bedtime. 11/03/19   [provider]  OLANZapine (ZYPREXA) 7.5 MG tablet Take 7.5 mg by mouth daily. 12/16/19   [provider]  sertraline (ZOLOFT) 100 MG tablet Take 100 mg by mouth daily.  12/16/19   [provider]  Vitamin D, Ergocalciferol, (DRISDOL) 1.25 MG (50000 UNIT) CAPS capsule Take 50,000 Units by mouth every 7 (seven) days.    [provider]     Family History  Problem Relation Age of Onset  . Heart disease Father   . COPD Sister   . Heart disease Brother   . Colon cancer Neg Hx     Social History   Socioeconomic History  . Marital status: Married    Spouse name: Not on file  . Number of children: Not on file  . Years of education: Not on file  . Highest education level: Not on file  Occupational History  . Not on file  Tobacco Use  . Smoking status: Current Every Day Smoker    Packs/day: 0.50    Years: 53.00    Pack years: 26.50    Types: Cigarettes  . Smokeless tobacco: Never Used  . Tobacco comment: smokes 4 cigarettes a day  Vaping Use  . Vaping Use: Never used  Substance and Sexual Activity  . Alcohol use: Not Currently    Comment: occ  . Drug use: No  . Sexual activity: Yes  Other Topics Concern  . Not on file  Social History Narrative  . Not on file   Social Determinants of Health   Financial Resource Strain: Not on file  Food Insecurity: Not on file  Transportation Needs: Not on file  Physical Activity: Not on file  Stress: Not on file  Social Connections: Not on file    Review of Systems: A 12 point ROS discussed and pertinent positives are indicated in the HPI above.  All other systems are negative.  Review of Systems  Constitutional: Positive for appetite change and fatigue.  Respiratory: Negative for cough and shortness of breath.   Cardiovascular: Negative for chest pain and leg swelling.  Gastrointestinal: Positive for abdominal pain and nausea. Negative for diarrhea and vomiting.       Lower abdominal/pelvic pain   Genitourinary: Positive for flank pain.       Left flank and back pain   Musculoskeletal: Positive for back pain.  Neurological: Negative for dizziness and headaches.   Psychiatric/Behavioral: The patient is nervous/anxious.     Vital Signs: BP 125/69   Pulse 80   Temp 98.4 F (36.9 C) (Oral)   Resp 15   Ht 5' (1.524 m)   Wt 79 lb (35.8 kg)   SpO2 97%   BMI 15.43 kg/m   Physical Exam Constitutional:      General: She is not in acute distress.    Appearance: She is underweight.  HENT:     Mouth/Throat:     Mouth: Mucous membranes are moist.     Pharynx: Oropharynx is clear.  Cardiovascular:     Rate and Rhythm: Normal rate and regular rhythm.  Pulmonary:     Breath sounds: Normal breath sounds.  Abdominal:     General: Bowel sounds are normal.     Palpations: Abdomen is soft.     Tenderness: There is abdominal tenderness.  Skin:    General: Skin is warm and dry.  Neurological:  Mental Status: She is alert and oriented to person, place, and time.  Psychiatric:        Mood and Affect: Mood is anxious.        Speech: Speech normal.        Behavior: Behavior normal.        Thought Content: Thought content normal.     Imaging: US BIOPSY (KIDNEY)  Result Date: 08/31/2020 INDICATION: 75 year old with a large left renal mass and tissue diagnosis is needed. EXAM: ULTRASOUND-GUIDED CORE BIOPSY OF LEFT RENAL MASS MEDICATIONS: Moderate sedation ANESTHESIA/SEDATION: Moderate (conscious) sedation was employed during this procedure. A total of Versed 0.5 mg and Fentanyl 12.5 mcg was administered intravenously. Moderate Sedation Time: 15 minutes. The patient's level of consciousness and vital signs were monitored continuously by radiology nursing throughout the procedure under my direct supervision. FLUOROSCOPY TIME:  None COMPLICATIONS: None immediate. PROCEDURE: Informed written consent was obtained from the patient after a thorough discussion of the procedural risks, benefits and alternatives. All questions were addressed. A timeout was performed prior to the initiation of the procedure. Patient was placed prone. Left kidney was identified with  ultrasound. The left flank was prepped and draped in sterile fashion. Maximal barrier sterile technique was utilized including caps, mask, sterile gowns, sterile gloves, sterile drape, hand hygiene and skin antiseptic. Skin was anesthetized using 1% lidocaine. A small incision was made. Using ultrasound guidance, 17 gauge coaxial needle was directed into the left renal mass. Total of 3 core biopsies were obtained with an 18 gauge core device. Specimens placed in formalin. Gel-Foam slurry was injected along the needle tract as the needle was removed. Bandage placed over the puncture site. FINDINGS: Mid and lower pole of the left kidney are replaced by a large tumor. Biopsy needle confirmed within the lower pole of the renal mass. No immediate bleeding or hematoma formation. IMPRESSION: Ultrasound-guided core biopsy of left renal mass. Electronically Signed   By: Markus Daft M.D.   On: 08/31/2020 10:05    Labs:  CBC: Recent Labs    12/22/19 1132 08/30/20 1448 08/31/20 0615  WBC 12.1* 14.1* 15.9*  HGB 11.7* 10.0* 10.9*  HCT 36.6 32.2* 35.2*  PLT 348 436* 522*    COAGS: Recent Labs    08/31/20 0615  INR 1.0    BMP: Recent Labs    12/22/19 1132 08/30/20 1448  NA 141 135  K 4.8 4.3  CL 102 100  CO2 33* 29  GLUCOSE 137* 92  BUN 25* 22  CALCIUM 10.2 9.7  CREATININE 1.13* 0.97  GFRNONAA 47* >60  GFRAA 55*  --     LIVER FUNCTION TESTS: Recent Labs    12/22/19 1132 08/30/20 1448  BILITOT 0.2* 0.2*  AST 17 14*  ALT 23 11  ALKPHOS 92 100  PROT 6.9 7.3  ALBUMIN 4.4 4.0    TUMOR MARKERS: No results for input(s): AFPTM, CEA, CA199, CHROMGRNA in the last 8760 hours.  Assessment and Plan:  Suspected urothelial carcinoma: Leslie Boyd, 75 year old female, presents today to the [facility] Interventional Radiology department for an image-guided renal mass biopsy.   Risks and benefits of this procedure were discussed with the patient and/or patient's family including, but not  limited to bleeding, infection, damage to adjacent structures or low yield requiring additional tests.  All of the questions were answered and there is agreement to proceed. She has been NPO. Vitals have been reviewed. Labs are pending but will be reviewed prior to the start of the  procedure.   Consent signed and in chart.  Thank you for this interesting consult.  I greatly enjoyed meeting Leslie Boyd and look forward to participating in their care.  A copy of this report was sent to the requesting provider on this date.  Electronically Signed: Soyla Dryer, AGACNP-BC [phone] 09/20/2020, 7:45 AM   I spent a total of  30 Minutes   in face to face in clinical consultation, greater than 50% of which was counseling/coordinating care for left renal mass biopsy

## 2020-09-20 ENCOUNTER — Encounter (HOSPITAL_COMMUNITY): Payer: Self-pay

## 2020-09-20 ENCOUNTER — Ambulatory Visit (HOSPITAL_COMMUNITY)
Admission: RE | Admit: 2020-09-20 | Discharge: 2020-09-20 | Disposition: A | Discharge status: Home / Self Care | Payer: Medicare HMO | Source: Ambulatory Visit | Attending: Hematology & Oncology | Admitting: Hematology & Oncology

## 2020-09-20 ENCOUNTER — Other Ambulatory Visit: Payer: Self-pay

## 2020-09-20 ENCOUNTER — Encounter: Payer: Self-pay | Admitting: *Deleted

## 2020-09-20 DIAGNOSIS — R591 Generalized enlarged lymph nodes: Secondary | ICD-10-CM | POA: Insufficient documentation

## 2020-09-20 DIAGNOSIS — F0390 Unspecified dementia without behavioral disturbance: Secondary | ICD-10-CM | POA: Diagnosis not present

## 2020-09-20 DIAGNOSIS — F419 Anxiety disorder, unspecified: Secondary | ICD-10-CM | POA: Diagnosis not present

## 2020-09-20 DIAGNOSIS — N2889 Other specified disorders of kidney and ureter: Secondary | ICD-10-CM

## 2020-09-20 DIAGNOSIS — F17210 Nicotine dependence, cigarettes, uncomplicated: Secondary | ICD-10-CM | POA: Insufficient documentation

## 2020-09-20 DIAGNOSIS — Z79899 Other long term (current) drug therapy: Secondary | ICD-10-CM | POA: Insufficient documentation

## 2020-09-20 DIAGNOSIS — C642 Malignant neoplasm of left kidney, except renal pelvis: Secondary | ICD-10-CM | POA: Diagnosis not present

## 2020-09-20 DIAGNOSIS — Z85118 Personal history of other malignant neoplasm of bronchus and lung: Secondary | ICD-10-CM | POA: Diagnosis not present

## 2020-09-20 LAB — CBC
HCT: 28.1 % — ABNORMAL LOW (ref 36.0–46.0)
Hemoglobin: 8.4 g/dL — ABNORMAL LOW (ref 12.0–15.0)
MCH: 26.8 pg (ref 26.0–34.0)
MCHC: 29.9 g/dL — ABNORMAL LOW (ref 30.0–36.0)
MCV: 89.8 fL (ref 80.0–100.0)
Platelets: 615 10*3/uL — ABNORMAL HIGH (ref 150–400)
RBC: 3.13 MIL/uL — ABNORMAL LOW (ref 3.87–5.11)
RDW: 19.9 % — ABNORMAL HIGH (ref 11.5–15.5)
WBC: 15.5 10*3/uL — ABNORMAL HIGH (ref 4.0–10.5)
nRBC: 0 % (ref 0.0–0.2)

## 2020-09-20 LAB — PROTIME-INR
INR: 1.1 (ref 0.8–1.2)
Prothrombin Time: 13.6 seconds (ref 11.4–15.2)

## 2020-09-20 MED ORDER — LIDOCAINE HCL (PF) 1 % IJ SOLN
INTRAMUSCULAR | Status: AC
  Filled 2020-09-20: qty 30

## 2020-09-20 MED ORDER — GELATIN ABSORBABLE 12-7 MM EX MISC
CUTANEOUS | Status: AC
  Filled 2020-09-20: qty 1

## 2020-09-20 MED ORDER — HYDROCODONE-ACETAMINOPHEN 5-325 MG PO TABS: 1.0000 | ORAL_TABLET | ORAL | Status: DC | PRN

## 2020-09-20 MED ORDER — FENTANYL CITRATE (PF) 100 MCG/2ML IJ SOLN
INTRAMUSCULAR | Status: AC | PRN
  Administered 2020-09-20: 25 ug via INTRAVENOUS

## 2020-09-20 MED ORDER — FENTANYL CITRATE (PF) 100 MCG/2ML IJ SOLN
INTRAMUSCULAR | Status: AC
  Filled 2020-09-20: qty 2

## 2020-09-20 MED ORDER — MIDAZOLAM HCL 2 MG/2ML IJ SOLN
INTRAMUSCULAR | Status: AC | PRN
  Administered 2020-09-20: 0.5 mg via INTRAVENOUS

## 2020-09-20 MED ORDER — SODIUM CHLORIDE 0.9 % IV SOLN: INTRAVENOUS | Status: DC

## 2020-09-20 MED ORDER — MIDAZOLAM HCL 2 MG/2ML IJ SOLN
INTRAMUSCULAR | Status: AC
  Filled 2020-09-20: qty 2

## 2020-09-20 NOTE — Procedures (Signed)
Interventional Radiology Procedure:   Indications: Left renal mass, repeat biopsy  Procedure: US guided left renal mass biopsy  Findings: Large necrotic mass, multiple cores obtained.   Complications: None     EBL: less than 10 ml  Plan: Bedrest 3 hours   Ezelle Surprenant R. Anselm Pancoast, MD  Pager: [phone]

## 2020-09-20 NOTE — Progress Notes (Signed)
Discharge instructions reviewed with pt and her son (via telephone) both voice understanding.

## 2020-09-20 NOTE — Progress Notes (Signed)
Oncology Nurse Navigator Documentation  Oncology Nurse Navigator Flowsheets 09/20/2020  Abnormal Finding Date -  Confirmed Diagnosis Date -  Diagnosis Status -  Expected Surgery Date -  Navigator Follow Up Date: 09/25/2020  Navigator Follow Up Reason: Pathology  Navigator Location [facility]  Referral Date to RadOnc/MedOnc -  Navigator Encounter Type Appt/Treatment Plan Review  Telephone -  Patient Visit Type MedOnc  Treatment Phase Abnormal Scans  Barriers/Navigation Needs Coordination of Care;Education;Family Concerns;Personal Conflicts  Education -  Interventions None Required  Acuity Level 4-High Needs (Greater Than 4 Barriers Identified)  Coordination of Care -  Education Method -  Support Groups/Services Friends and Family  Time Spent with Patient 15

## 2020-09-20 NOTE — Sedation Documentation (Addendum)
Attempt made x 1 to call report to RN in Short Stay. Per Caryl Pina, RN is currently checking in a patient at this time and is not available at the moment to receive report, but will call back when RN is available.

## 2020-09-20 NOTE — Discharge Instructions (Addendum)
Percutaneous Kidney Biopsy, Care After This sheet gives you information about how to care for yourself after your procedure. Your health care provider may also give you more specific instructions. If you have problems or questions, contact your health care provider. What can I expect after the procedure? After the procedure, it is common to have:  Pain or soreness near the biopsy site.  Pink or cloudy urine for 24 hours after the procedure. This is normal. Follow these instructions at home: Activity  Return to your normal activities as told by your health care provider. Ask your health care provider what activities are safe for you.  If you were given a sedative during the procedure, it can affect you for several hours. Do not drive or operate machinery until your health care provider says that it is safe.  Do not lift anything that is heavier than 10 lb (4.5 kg), or the limit that you are told, until your health care provider says that it is safe.  Avoid activities that take a lot of effort until your health care provider approves. Most people will have to wait 2 weeks before returning to activities such as exercise or sex. General instructions  Take over-the-counter and prescription medicines only as told by your health care provider.  Follow instructions from your health care provider about eating or drinking restrictions.  Check your biopsy site every day for signs of infection. Check for: ? More redness, swelling, or pain. ? Fluid or blood. ? Warmth. ? Pus or a bad smell.  Keep all follow-up visits as told by your health care provider. This is important.   Contact a health care provider if:  You have more redness, swelling, or pain around your biopsy site.  You have fluid or blood coming from your biopsy site.  Your biopsy site feels warm to the touch.  You have pus or a bad smell coming from your biopsy site.  You have blood in your urine more than 24 hours after your  procedure. Get help right away if:  Your urine is dark red or brown.  You have a fever.  You are not able to urinate.  You feel burning when you urinate.  You feel dizzy or light-headed.  You have severe pain in your abdomen or side. Summary  After the procedure, it is common to have pain or soreness at the biopsy site and pink or cloudy urine for the first 24 hours.  Check your biopsy site each day for signs of infection, such as more redness, swelling, or pain; fluid, blood, pus or a bad smell coming from the biopsy site; or the biopsy site feeling warm to the touch.  Return to your normal activities as told by your health care provider. This information is not intended to replace advice given to you by your health care provider. Make sure you discuss any questions you have with your health care provider. Document Revised: 09/16/2019 Document Reviewed: 09/16/2019 Elsevier Patient Education  2021 Elsevier Inc. Moderate Conscious Sedation, Adult Sedation is the use of medicines to promote relaxation and to relieve discomfort and anxiety. Moderate conscious sedation is a type of sedation. Under moderate conscious sedation, you are less alert than normal, but you are still able to respond to instructions, touch, or both. Moderate conscious sedation is used during short medical and dental procedures. It is milder than deep sedation, which is a type of sedation under which you cannot be easily woken up. It is also milder than   general anesthesia, which is the use of medicines to make you unconscious. Moderate conscious sedation allows you to return to your regular activities sooner. Tell a health care provider about:  Any allergies you have.  All medicines you are taking, including vitamins, herbs, eye drops, creams, and over-the-counter medicines.  Any use of steroids. This includes steroids taken by mouth or as a cream.  Any problems you or family members have had with sedatives and  anesthetic medicines.  Any blood disorders you have.  Any surgeries you have had.  Any medical conditions you have, such as sleep apnea.  Whether you are pregnant or may be pregnant.  Any use of cigarettes, alcohol, marijuana, or drugs. What are the risks? Generally, this is a safe procedure. However, problems may occur, including:  Getting too much medicine (oversedation).  Nausea.  Allergic reaction to medicines.  Trouble breathing. If this happens, a breathing tube may be used. It will be removed when you are awake and breathing on your own.  Heart trouble.  Lung trouble.  Confusion that gets better with time (emergence delirium). What happens before the procedure? Staying hydrated Follow instructions from your health care provider about hydration, which may include:  Up to 2 hours before the procedure - you may continue to drink clear liquids, such as water, clear fruit juice, black coffee, and plain tea. Eating and drinking restrictions Follow instructions from your health care provider about eating and drinking, which may include:  8 hours before the procedure - stop eating heavy meals or foods, such as meat, fried foods, or fatty foods.  6 hours before the procedure - stop eating light meals or foods, such as toast or cereal.  6 hours before the procedure - stop drinking milk or drinks that contain milk.  2 hours before the procedure - stop drinking clear liquids. Medicines Ask your health care provider about:  Changing or stopping your regular medicines. This is especially important if you are taking diabetes medicines or blood thinners.  Taking medicines such as aspirin and ibuprofen. These medicines can thin your blood. Do not take these medicines unless your health care provider tells you to take them.  Taking over-the-counter medicines, vitamins, herbs, and supplements. Tests and exams  You will have a physical exam.  You may have blood tests done to  show how well: ? Your kidneys and liver work. ? Your blood clots. General instructions  Plan to have a responsible adult take you home from the hospital or clinic.  If you will be going home right after the procedure, plan to have a responsible adult care for you for the time you are told. This is important. What happens during the procedure?  You will be given the sedative. The sedative may be given: ? As a pill that you will swallow. It can also be inserted into the rectum. ? As a spray through the nose. ? As an injection into the muscle. ? As an injection into the vein through an IV.  You may be given oxygen as needed.  Your breathing, heart rate, and blood pressure will be monitored during the procedure.  The medical or dental procedure will be done. The procedure may vary among health care providers and hospitals.   What happens after the procedure?  Your blood pressure, heart rate, breathing rate, and blood oxygen level will be monitored until you leave the hospital or clinic.  You will get fluids through your IV if needed.  Do not drive   or operate machinery until your health care provider says that it is safe. Summary  Sedation is the use of medicines to promote relaxation and to relieve discomfort and anxiety. Moderate conscious sedation is a type of sedation that is used during short medical and dental procedures.  Tell the health care provider about any medical conditions that you have and about all the medicines that you are taking.  You will be given the sedative as a pill, a spray through the nose, an injection into the muscle, or an injection into the vein through an IV. Vital signs are monitored during the sedation.  Moderate conscious sedation allows you to return to your regular activities sooner. This information is not intended to replace advice given to you by your health care provider. Make sure you discuss any questions you have with your health care  provider. Document Revised: 10/21/2019 Document Reviewed: 05/19/2019 Elsevier Patient Education  2021 Elsevier Inc.  

## 2020-09-21 LAB — SURGICAL PATHOLOGY

## 2020-09-25 ENCOUNTER — Encounter: Payer: Self-pay | Admitting: *Deleted

## 2020-09-25 NOTE — Progress Notes (Signed)
Per Dr Marin Olp oncotype MAP testing request sent on specimen  [id number]  DOS 09/20/2020  Currently no follow up appointment scheduled. Dr Marin Olp would like to see patient in about 14 days. Message sent to scheduling.   Oncology Nurse Navigator Documentation  Oncology Nurse Navigator Flowsheets 09/25/2020  Abnormal Finding Date -  Confirmed Diagnosis Date 09/20/2020  Diagnosis Status -  Expected Surgery Date -  Navigator Follow Up Date: 10/11/2020  Navigator Follow Up Reason: Follow-up Appointment  Navigator Location [facility]  Referral Date to RadOnc/MedOnc -  Navigator Encounter Type Pathology Review;Molecular Studies  Telephone -  Patient Visit Type MedOnc  Treatment Phase Abnormal Scans  Barriers/Navigation Needs Coordination of Care;Education;Family Concerns;Personal Conflicts  Education -  Interventions Coordination of Care  Acuity Level 4-High Needs (Greater Than 4 Barriers Identified)  Coordination of Care Other;Appts  Education Method -  Support Groups/Services Friends and Family  Time Spent with Patient 72

## 2020-09-27 ENCOUNTER — Other Ambulatory Visit: Payer: Self-pay

## 2020-09-27 ENCOUNTER — Emergency Department (HOSPITAL_BASED_OUTPATIENT_CLINIC_OR_DEPARTMENT_OTHER): Payer: Medicare HMO

## 2020-09-27 ENCOUNTER — Encounter: Payer: Self-pay | Admitting: *Deleted

## 2020-09-27 ENCOUNTER — Emergency Department (HOSPITAL_BASED_OUTPATIENT_CLINIC_OR_DEPARTMENT_OTHER)
Admission: EM | Admit: 2020-09-27 | Discharge: 2020-09-27 | Disposition: A | Discharge status: Home / Self Care | Payer: Medicare HMO | Attending: Emergency Medicine | Admitting: Emergency Medicine

## 2020-09-27 ENCOUNTER — Encounter (HOSPITAL_BASED_OUTPATIENT_CLINIC_OR_DEPARTMENT_OTHER): Payer: Self-pay | Admitting: Emergency Medicine

## 2020-09-27 DIAGNOSIS — Z87442 Personal history of urinary calculi: Secondary | ICD-10-CM | POA: Insufficient documentation

## 2020-09-27 DIAGNOSIS — M549 Dorsalgia, unspecified: Secondary | ICD-10-CM | POA: Insufficient documentation

## 2020-09-27 DIAGNOSIS — K219 Gastro-esophageal reflux disease without esophagitis: Secondary | ICD-10-CM | POA: Insufficient documentation

## 2020-09-27 DIAGNOSIS — I10 Essential (primary) hypertension: Secondary | ICD-10-CM | POA: Insufficient documentation

## 2020-09-27 DIAGNOSIS — F0391 Unspecified dementia with behavioral disturbance: Secondary | ICD-10-CM | POA: Insufficient documentation

## 2020-09-27 DIAGNOSIS — R1013 Epigastric pain: Secondary | ICD-10-CM | POA: Insufficient documentation

## 2020-09-27 DIAGNOSIS — R112 Nausea with vomiting, unspecified: Secondary | ICD-10-CM | POA: Diagnosis not present

## 2020-09-27 DIAGNOSIS — Z85828 Personal history of other malignant neoplasm of skin: Secondary | ICD-10-CM | POA: Insufficient documentation

## 2020-09-27 DIAGNOSIS — Z85118 Personal history of other malignant neoplasm of bronchus and lung: Secondary | ICD-10-CM | POA: Diagnosis not present

## 2020-09-27 DIAGNOSIS — F17210 Nicotine dependence, cigarettes, uncomplicated: Secondary | ICD-10-CM | POA: Diagnosis not present

## 2020-09-27 DIAGNOSIS — Z79899 Other long term (current) drug therapy: Secondary | ICD-10-CM | POA: Diagnosis not present

## 2020-09-27 DIAGNOSIS — R072 Precordial pain: Secondary | ICD-10-CM | POA: Insufficient documentation

## 2020-09-27 DIAGNOSIS — N2889 Other specified disorders of kidney and ureter: Secondary | ICD-10-CM

## 2020-09-27 DIAGNOSIS — C7951 Secondary malignant neoplasm of bone: Secondary | ICD-10-CM

## 2020-09-27 HISTORY — DX: Malignant (primary) neoplasm, unspecified: C80.1

## 2020-09-27 LAB — HEPATIC FUNCTION PANEL
ALT: 9 U/L (ref 0–44)
AST: 18 U/L (ref 15–41)
Albumin: 3.8 g/dL (ref 3.5–5.0)
Alkaline Phosphatase: 78 U/L (ref 38–126)
Bilirubin, Direct: <0.1 mg/dL (ref 0.0–0.2)
Total Bilirubin: 0.3 mg/dL (ref 0.3–1.2)
Total Protein: 8.1 g/dL (ref 6.5–8.1)

## 2020-09-27 LAB — BASIC METABOLIC PANEL
Anion gap: 12 (ref 5–15)
BUN: 22 mg/dL (ref 8–23)
CO2: 26 mmol/L (ref 22–32)
Calcium: 9.4 mg/dL (ref 8.9–10.3)
Chloride: 100 mmol/L (ref 98–111)
Creatinine, Ser: 0.89 mg/dL (ref 0.44–1.00)
GFR, Estimated: >60 mL/min (ref >60)
Glucose, Bld: 152 mg/dL — ABNORMAL HIGH (ref 70–99)
Potassium: 3.9 mmol/L (ref 3.5–5.1)
Sodium: 138 mmol/L (ref 135–145)

## 2020-09-27 LAB — URINALYSIS, ROUTINE W REFLEX MICROSCOPIC
Bilirubin Urine: NEGATIVE
Glucose, UA: NEGATIVE mg/dL
Hgb urine dipstick: NEGATIVE
Ketones, ur: NEGATIVE mg/dL
Leukocytes,Ua: NEGATIVE
Nitrite: NEGATIVE
Protein, ur: NEGATIVE mg/dL
Specific Gravity, Urine: 1.015 (ref 1.005–1.030)
pH: 7.5 (ref 5.0–8.0)

## 2020-09-27 LAB — CBC
HCT: 31.2 % — ABNORMAL LOW (ref 36.0–46.0)
Hemoglobin: 9.6 g/dL — ABNORMAL LOW (ref 12.0–15.0)
MCH: 27 pg (ref 26.0–34.0)
MCHC: 30.8 g/dL (ref 30.0–36.0)
MCV: 87.6 fL (ref 80.0–100.0)
Platelets: 510 10*3/uL — ABNORMAL HIGH (ref 150–400)
RBC: 3.56 MIL/uL — ABNORMAL LOW (ref 3.87–5.11)
RDW: 18.4 % — ABNORMAL HIGH (ref 11.5–15.5)
WBC: 13.3 10*3/uL — ABNORMAL HIGH (ref 4.0–10.5)
nRBC: 0 % (ref 0.0–0.2)

## 2020-09-27 LAB — TROPONIN I (HIGH SENSITIVITY)
Troponin I (High Sensitivity): 5 ng/L (ref <18)
Troponin I (High Sensitivity): 7 ng/L (ref <18)

## 2020-09-27 LAB — LIPASE, BLOOD: Lipase: 22 U/L (ref 11–51)

## 2020-09-27 MED ORDER — PROMETHAZINE HCL 12.5 MG PO TABS: 25.0000 mg | ORAL_TABLET | Freq: Three times a day (TID) | ORAL | 0 refills | Status: DC | PRN

## 2020-09-27 MED ORDER — SODIUM CHLORIDE 0.9 % IV SOLN
12.5000 mg | Freq: Once | INTRAVENOUS | Status: AC
  Administered 2020-09-27: 12.5 mg via INTRAVENOUS
  Filled 2020-09-27: qty 0.5

## 2020-09-27 MED ORDER — SODIUM CHLORIDE 0.9 % IV SOLN: INTRAVENOUS | Status: DC

## 2020-09-27 MED ORDER — SODIUM CHLORIDE 0.9% FLUSH
3.0000 mL | Freq: Once | INTRAVENOUS | Status: AC
  Administered 2020-09-27: 3 mL via INTRAVENOUS
  Filled 2020-09-27: qty 3

## 2020-09-27 MED ORDER — ONDANSETRON HCL 4 MG/2ML IJ SOLN
4.0000 mg | Freq: Once | INTRAMUSCULAR | Status: DC
  Filled 2020-09-27: qty 2

## 2020-09-27 MED ORDER — IOHEXOL 350 MG/ML SOLN
100.0000 mL | Freq: Once | INTRAVENOUS | Status: AC | PRN
  Administered 2020-09-27: 75 mL via INTRAVENOUS

## 2020-09-27 MED ORDER — SODIUM CHLORIDE 0.9 % IV BOLUS
500.0000 mL | Freq: Once | INTRAVENOUS | Status: AC
  Administered 2020-09-27: 500 mL via INTRAVENOUS

## 2020-09-27 MED ORDER — PROMETHAZINE HCL 25 MG/ML IJ SOLN
INTRAMUSCULAR | Status: AC
  Filled 2020-09-27: qty 1

## 2020-09-27 MED ORDER — HYDROMORPHONE HCL 1 MG/ML IJ SOLN
1.0000 mg | Freq: Once | INTRAMUSCULAR | Status: AC
Start: 2020-09-27 — End: 2020-09-27
  Administered 2020-09-27: 1 mg via INTRAVENOUS
  Filled 2020-09-27: qty 1

## 2020-09-27 NOTE — Progress Notes (Signed)
Received a call from patient's son, Jenny Reichmann, regarding biopsy results. I reviewed biopsy results with Jenny Reichmann and the molecular testing that we have requested. Explained that at her upcoming appointments, we should have all the information we need to make treatment recommendations. Reviewed appointment date and time. He will attempt to come to this appointment.   Oncology Nurse Navigator Documentation  Oncology Nurse Navigator Flowsheets 09/27/2020  Abnormal Finding Date -  Confirmed Diagnosis Date -  Diagnosis Status -  Expected Surgery Date -  Navigator Follow Up Date: 10/11/2020  Navigator Follow Up Reason: Follow-up Appointment  Navigator Location [facility]  Referral Date to RadOnc/MedOnc -  Navigator Encounter Type Telephone  Telephone Incoming Call;Patient Update  Patient Visit Type MedOnc  Treatment Phase Pre-Tx/Tx Discussion  Barriers/Navigation Needs Coordination of Care;Education;Family Concerns;Personal Conflicts  Education Other  Interventions Education;Psycho-Social Support  Acuity Level 4-High Needs (Greater Than 4 Barriers Identified)  Coordination of Care -  Education Method Verbal  Support Groups/Services Friends and Family  Time Spent with Patient 30

## 2020-09-27 NOTE — ED Provider Notes (Signed)
Leslie Boyd   CSN: [id number] Arrival date & time: 09/27/20  1121     History Chief Complaint  Patient presents with  . Nausea  . Emesis    Leslie Boyd is a 75 y.o. female.  Patient with onset of nausea and vomiting since 6 this morning.  Also has anterior chest pain as well as abdominal pain.  And back pain.  Patient was told by primary care doctor to come in for fluids because they thought she would be dehydrated.  Patient's past medical history is significant for history of lung cancer which seems to be controlled.  Followed by Dr. Marin Olp.  On February 24 patient with new finding of a left renal cell cancer.  Plugged into urology with Dr. Gilford Rile.  They are planning to do a biopsy.  Patient had acute onset of nausea and vomiting at 6 this morning.  No vomiting of blood.        Past Medical History:  Diagnosis Date  . Anxiety   . Arthritis   . Cancer ([place])   . Coronary artery calcification seen on CT scan    cardiologist-- dr Atilano Median [facility] in [facility])  . GERD (gastroesophageal reflux disease)   . History of kidney stones    ureter stent in place  . History of stress test    stress echo 01-29-2018 with dr Atilano Median, cardiologist with [facility] in [facility]  . Hypertension   . 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 Osteopenia   . Renal lesion    left renal pelvis  . Renal mass, left 08/30/2020    Patient Active Problem List   Diagnosis Date Noted  . Renal mass, left 08/30/2020  . Dementia with behavioral disturbance ([place]) 12/08/2019  . GAD (generalized anxiety disorder) 12/08/2019  . Gastroesophageal reflux disease without esophagitis 12/08/2019  . Intentional drug overdose ([place]) 12/08/2019  . Cognitive impairment 11/16/2019  . Dermatochalasis of both upper eyelids 04/26/2019  . Keratoconjunctivitis sicca of both eyes not specified as Sjogren's  04/26/2019  . Adenocarcinoma of left lung, stage 1 ([place]) 05/07/2018  . Aortic atherosclerosis ([place]) 03/15/2018  . Substance abuse ([place])   . 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 Vitamin D deficiency 07/21/2017  . Coronary artery calcification seen on CAT scan 07/15/2017  . Episodic tobacco dependence 09/01/2016  . Essential hypertension 09/01/2016  . Nodule of left lung 09/01/2016  . Hypercholesteremia 07/18/2016  . Indigestion 03/17/2016  . Mixed emotional features as adjustment reaction 03/17/2016  . Osteopenia 03/17/2016    Past Surgical History:  Procedure Laterality Date  . CATARACT EXTRACTION W/ INTRAOCULAR LENS  IMPLANT, BILATERAL  2016  . [place]  . CYSTOSCOPY WITH RETROGRADE PYELOGRAM, URETEROSCOPY AND STENT PLACEMENT Left 04/27/2018   Procedure: CYSTOSCOPY WITH BILATERAL RETROGRADE PYELOGRAM, POSSIBLE LEFT URETEROSCOPY AND POSSIBLE STENT PLACEMENT;  Surgeon: Ceasar Mons, MD;  Location[facility];  Service: Urology;  Laterality: Left;  . SKIN GRAFT    . TONSILLECTOMY  1963  . VIDEO ASSISTED THORACOSCOPY (VATS)/WEDGE RESECTION Left 05/03/2018   Procedure: VIDEO ASSISTED THORACOSCOPY (VATS)/WEDGE RESECTION;  Surgeon: [name], MD;  Location: [place];  Service: Thoracic;  Laterality: Left;     OB History   No obstetric history on file.     Family History  Problem Relation  Age of Onset  . Heart disease Father   . COPD Sister   . Heart disease Brother   . Colon cancer Neg Hx     Social History   Tobacco Use  . Smoking status: Current Every Day Smoker    Packs/day: 0.50    Years: 53.00    Pack years: 26.50    Types: Cigarettes  . Smokeless tobacco: Never Used  . Tobacco comment: smokes 4 cigarettes a day  Vaping Use  . Vaping Use: Never used  Substance Use Topics  . Alcohol use: Not Currently    Comment: occ  . Drug use: No    Home Medications Prior to Admission  medications   Medication Sig Start Date End Date Taking? Authorizing Provider  acetaminophen (TYLENOL) 500 MG tablet Take 1,000 mg by mouth every 8 (eight) hours as needed for moderate pain.    [provider]  amLODipine-atorvastatin (CADUET) 5-20 MG tablet Take 1 tablet by mouth daily.    [provider]  famotidine (PEPCID) 20 MG tablet Take 20 mg by mouth 2 (two) times daily. 02/27/19   [provider]  HYDROcodone-acetaminophen (NORCO/VICODIN) 5-325 MG tablet Take 1 tablet by mouth every 6 (six) hours as needed for moderate pain. 09/05/20   [name], MD  LORazepam (ATIVAN) 0.5 MG tablet Take 0.5 mg by mouth 2 (two) times daily. 06/22/18   [provider]  mirtazapine (REMERON) 15 MG tablet Take 15 mg by mouth at bedtime. 11/03/19   [provider]  OLANZapine (ZYPREXA) 7.5 MG tablet Take 7.5 mg by mouth daily. 12/16/19   [provider]  sertraline (ZOLOFT) 100 MG tablet Take 100 mg by mouth daily. 12/16/19   [provider]  Vitamin D, Ergocalciferol, (DRISDOL) 1.25 MG (50000 UNIT) CAPS capsule Take 50,000 Units by mouth every 7 (seven) days.    [provider]    Allergies    Ondansetron hcl, Morphine, and Morphine and related  Review of Systems   Review of Systems  Constitutional: Negative for chills and fever.  HENT: Negative for rhinorrhea and sore throat.   Eyes: Negative for visual disturbance.  Respiratory: Negative for cough and shortness of breath.   Cardiovascular: Positive for chest pain. Negative for leg swelling.  Gastrointestinal: Positive for abdominal pain, nausea and vomiting. Negative for diarrhea.  Genitourinary: Negative for dysuria.  Musculoskeletal: Negative for back pain and neck pain.  Skin: Negative for rash.  Neurological: Negative for dizziness, light-headedness and headaches.  Hematological: Does not bruise/bleed easily.  Psychiatric/Behavioral: Negative for confusion.     Physical Exam Updated Vital Signs BP (!) 170/84   Pulse 81   Temp 98.1 F (36.7 C) (Oral)   Resp 16   Ht 1.524 m (5')   Wt 36.3 kg   SpO2 98%   BMI 15.62 kg/m   Physical Exam Vitals and nursing Boyd reviewed.  Constitutional:      General: She is not in acute distress.    Appearance: Normal appearance. She is well-developed. She is ill-appearing.  HENT:     Head: Normocephalic and atraumatic.     Mouth/Throat:     Mouth: Mucous membranes are dry.  Eyes:     Extraocular Movements: Extraocular movements intact.     Conjunctiva/sclera: Conjunctivae normal.     Pupils: Pupils are equal, round, and reactive to light.  Cardiovascular:     Rate and Rhythm: Normal rate and regular rhythm.     Heart sounds: No murmur heard.  Pulmonary:     Effort: Pulmonary effort is normal. No respiratory distress.     Breath sounds: Normal breath sounds.  Abdominal:     General: There is no distension.     Palpations: Abdomen is soft.     Tenderness: There is no abdominal tenderness.  Musculoskeletal:        General: Normal range of motion.     Cervical back: Normal range of motion and neck supple.  Skin:    General: Skin is warm and dry.     Capillary Refill: Capillary refill takes less than 2 seconds.  Neurological:     General: No focal deficit present.     Mental Status: She is alert and oriented to person, place, and time.     ED Results / Procedures / Treatments   Labs (all labs ordered are listed, but only abnormal results are displayed) Labs Reviewed  BASIC METABOLIC PANEL - Abnormal; Notable for the following components:      Result Value   Glucose, Bld 152 (*)    All other components within normal limits  CBC - Abnormal; Notable for the following components:   WBC 13.3 (*)    RBC 3.56 (*)    Hemoglobin 9.6 (*)    HCT 31.2 (*)    RDW 18.4 (*)    Platelets 510 (*)    All other components within normal limits  HEPATIC FUNCTION PANEL  LIPASE, BLOOD  TROPONIN I  (HIGH SENSITIVITY)  TROPONIN I (HIGH SENSITIVITY)    EKG EKG Interpretation  Date/Time:  Thursday September 27 2020 11:40:03 EDT Ventricular Rate:  76 PR Interval:  106 QRS Duration: 84 QT Interval:  394 QTC Calculation: 443 R Axis:   77 Text Interpretation: Sinus rhythm with short PR Cannot rule out Anterior infarct , age undetermined Abnormal ECG Confirmed by Fredia Sorrow [phone]) on 09/27/2020 11:58:26 AM   Radiology DG Chest 2 View  Result Date: 09/27/2020 CLINICAL DATA:  Chest pain, nausea and vomiting since 0600 hours, recently diagnosed with cancer EXAM: CHEST - 2 VIEW COMPARISON:  04/16/2020 FINDINGS: Normal heart size, mediastinal contours, and pulmonary vascularity. Emphysematous changes without pulmonary infiltrate, pleural effusion or pneumothorax. BILATERAL nipple shadows. Healing fracture of the posterior LEFT ninth rib. Osseous demineralization with mild scoliosis and degenerative disc disease changes of thoracic spine. IMPRESSION: COPD changes without acute abnormalities. Electronically Signed   By: Lavonia Dana M.D.   On: 09/27/2020 12:40    Procedures Procedures   Medications Ordered in ED Medications  0.9 %  sodium chloride infusion (has no administration in time range)  0.9 %  sodium chloride infusion (has no administration in time range)  promethazine (PHENERGAN) 12.5 mg in sodium chloride 0.9 % 50 mL IVPB (12.5 mg Intravenous New Bag/Given 09/27/20 1444)  promethazine (PHENERGAN) 25 MG/ML injection (has no administration in time range)  sodium chloride flush (NS) 0.9 % injection 3 mL (3 mLs Intravenous Bolus 09/27/20 1451)  sodium chloride 0.9 % bolus 500 mL (500 mLs Intravenous New Bag/Given 09/27/20 1440)  HYDROmorphone (DILAUDID) injection 1 mg (1 mg Intravenous Given 09/27/20 1441)  iohexol (OMNIPAQUE) 350 MG/ML injection 100 mL (75 mLs Intravenous Contrast Given 09/27/20 1413)    ED Course  I have reviewed the triage vital signs and the nursing  notes.  Pertinent labs & imaging results that were available during my care of the patient were reviewed by me and considered in my medical decision making (see chart for details).    MDM Rules/Calculators/A&P  Patient's mucous membranes are dry.  May have had some pre-existing dehydration.  Doubt that she can significantly dehydrated from of his vomiting starting at 6 this morning.  But due to her history of lung CA and the left renal cell mass.  This most likely renal cell carcinoma we will go ahead and do CT angio chest to rule out pulmonary embolus.  And will do a CT of her abdomen to see if there is any significant changes.  Other labs significant for white blood cell count of 13,000 hemoglobin is okay at 9.6.  Blood sugar 152.  Renal functions normal.  No significant electrolyte abnormalities.  Liver function test and lipase are pending.  Patient allergic to Zofran so given some Phenergan for the nausea.  And will receive some IV fluid hydration.  EKG with some anterior changes cannot rule out anterior age-indeterminate infarct.  Initial troponin was very reassuring at 4.  Second troponin is pending.  Regular chest x-ray consistent with COPD without any acute abnormalities.   Disposition will mostly be based on the CT angio and the CT of the abdomen and pelvis.  No evidence of any significant dehydration.  Final Clinical Impression(s) / ED Diagnoses Final diagnoses:  Epigastric pain  Precordial pain    Rx / DC Orders ED Discharge Orders    None       Fredia Sorrow, MD 09/27/20 1452

## 2020-09-27 NOTE — ED Notes (Signed)
AVS reviewed with pt and husband, opportunity for questions provided as well. Pt assisted with getting and dressed. Assisted pt to POV via wheelchair.

## 2020-09-27 NOTE — Discharge Instructions (Addendum)
You were seen in the emergency department for nausea vomiting chest abdomen and back pain.  You had a CAT scan that showed a left kidney mass and a possible left rib lesion.  Please call Dr. Antonieta Pert office tomorrow for close follow-up.  We are prescribing you some nausea medication.  Return to the emergency department for any worsening or concerning symptoms

## 2020-09-27 NOTE — ED Triage Notes (Addendum)
Patient reports n/v since 0600 this morning.  Per family patient was recently diagnosed with kidney cancer awaiting biopsy results for treatment.  PCP told them to come to ED for fluids.  Later in triage c/o central chest pain as well.

## 2020-09-27 NOTE — ED Provider Notes (Signed)
Signout from Dr. Bobby Rumpf.  75 year old female here with nausea vomiting diarrhea chest and abdominal pain.  Back pain.  She has a history of lung cancer and a new renal mass that is actively being worked up by oncology and urology.  Plan is to follow-up on CT angio chest and CT abdomen and pelvis along with delta troponin. Physical Exam  BP 136/66   Pulse 81   Temp 98.1 F (36.7 C) (Oral)   Resp 12   Ht 5' (1.524 m)   Wt 36.3 kg   SpO2 97%   BMI 15.62 kg/m   Physical Exam  ED Course/Procedures     Procedures  MDM  CT showing renal mass along with likely metastases to rib.  No evidence of PE.  IMPRESSION:  Infiltrative, expansile mass centered within the left renal pelvis  and lower pole calices in keeping with a primary urothelial neoplasm  with extensive infiltration of the left kidney and loss of the  normal renal architecture. Associated left pararenal and retrocrural  adenopathy. Left posterior chest wall metastasis adjacent to the  left 12th costochondral junction.    No thoracic metastatic disease.    No pulmonary embolism.    Extensive multi-vessel coronary artery calcification.    COPD    Aortic Atherosclerosis (ICD10-I70.0).   I reviewed results with patient and her husband.  I also secure messaged Dr. Marin Olp.  She is comfortable plan for discharge.  Recommended close follow-up with Dr. Marin Olp.  Return instructions discussed     Hayden Rasmussen, MD 09/28/20 1004

## 2020-09-27 NOTE — ED Notes (Signed)
To CT dept

## 2020-09-28 ENCOUNTER — Encounter: Payer: Self-pay | Admitting: *Deleted

## 2020-09-28 ENCOUNTER — Telehealth: Payer: Self-pay

## 2020-09-28 NOTE — Progress Notes (Signed)
Received a call from patient's son, Leslie Boyd. Patient was seen in the ED yesterday and they have heard conflicting information regarding her scans. I reviewed the scan results with Leslie Boyd, including the known kidney cancer, with a new finding of a met to the chest wall. There continues to be no evidence of cancer in the lungs. He was appreciative of the information.  John also states they need to reschedule her appointment, as he needs to be present and is unavailable on the 7th. Message sent to scheduling to reschedule appointment with Leslie Boyd.   Scans reviewed with Dr Marin Olp. He states there is no reason to bring her in any sooner than already planned, after we receive the molecular study results.  Oncology Nurse Navigator Documentation  Oncology Nurse Navigator Flowsheets 09/28/2020  Abnormal Finding Date -  Confirmed Diagnosis Date -  Diagnosis Status -  Expected Surgery Date -  Navigator Follow Up Date: 10/17/2020  Navigator Follow Up Reason: Follow-up Appointment  Navigator Location [facility]  Referral Date to RadOnc/MedOnc -  Navigator Encounter Type Telephone  Telephone Incoming Call;Diagnostic Results;Appt Confirmation/Clarification  Patient Visit Type MedOnc  Treatment Phase Pre-Tx/Tx Discussion  Barriers/Navigation Needs Coordination of Care;Education;Family Concerns;Personal Conflicts  Education Other  Interventions Coordination of Care;Education;Psycho-Social Support  Acuity Level 4-High Needs (Greater Than 4 Barriers Identified)  Coordination of Care Appts  Education Method Verbal  Support Groups/Services Friends and Family  Time Spent with Patient 67

## 2020-09-28 NOTE — Telephone Encounter (Signed)
Per sch message pt needs to r/s her appt, called son Jenny Reichmann and left a vm with new appt for 10/17/20    Kahli Mayon

## 2020-09-29 ENCOUNTER — Other Ambulatory Visit: Payer: Self-pay

## 2020-09-29 ENCOUNTER — Emergency Department (HOSPITAL_BASED_OUTPATIENT_CLINIC_OR_DEPARTMENT_OTHER)
Admission: EM | Admit: 2020-09-29 | Discharge: 2020-09-29 | Disposition: A | Discharge status: Home / Self Care | Payer: Medicare HMO | Attending: Emergency Medicine | Admitting: Emergency Medicine

## 2020-09-29 ENCOUNTER — Encounter (HOSPITAL_BASED_OUTPATIENT_CLINIC_OR_DEPARTMENT_OTHER): Payer: Self-pay | Admitting: Emergency Medicine

## 2020-09-29 DIAGNOSIS — Z85118 Personal history of other malignant neoplasm of bronchus and lung: Secondary | ICD-10-CM | POA: Insufficient documentation

## 2020-09-29 DIAGNOSIS — F0391 Unspecified dementia with behavioral disturbance: Secondary | ICD-10-CM | POA: Insufficient documentation

## 2020-09-29 DIAGNOSIS — I10 Essential (primary) hypertension: Secondary | ICD-10-CM | POA: Diagnosis not present

## 2020-09-29 DIAGNOSIS — F17210 Nicotine dependence, cigarettes, uncomplicated: Secondary | ICD-10-CM | POA: Insufficient documentation

## 2020-09-29 DIAGNOSIS — R112 Nausea with vomiting, unspecified: Secondary | ICD-10-CM | POA: Insufficient documentation

## 2020-09-29 DIAGNOSIS — Z79899 Other long term (current) drug therapy: Secondary | ICD-10-CM | POA: Diagnosis not present

## 2020-09-29 LAB — URINALYSIS, ROUTINE W REFLEX MICROSCOPIC
Bilirubin Urine: NEGATIVE
Glucose, UA: NEGATIVE mg/dL
Ketones, ur: NEGATIVE mg/dL
Leukocytes,Ua: NEGATIVE
Nitrite: NEGATIVE
Protein, ur: NEGATIVE mg/dL
Specific Gravity, Urine: 1.02 (ref 1.005–1.030)
pH: 7.5 (ref 5.0–8.0)

## 2020-09-29 LAB — CBC WITH DIFFERENTIAL/PLATELET
Abs Immature Granulocytes: 0.07 10*3/uL (ref 0.00–0.07)
Basophils Absolute: 0 10*3/uL (ref 0.0–0.1)
Basophils Relative: 0 %
Eosinophils Absolute: 0 10*3/uL (ref 0.0–0.5)
Eosinophils Relative: 0 %
HCT: 33.2 % — ABNORMAL LOW (ref 36.0–46.0)
Hemoglobin: 10.4 g/dL — ABNORMAL LOW (ref 12.0–15.0)
Immature Granulocytes: 1 %
Lymphocytes Relative: 5 %
Lymphs Abs: 0.7 10*3/uL (ref 0.7–4.0)
MCH: 27 pg (ref 26.0–34.0)
MCHC: 31.3 g/dL (ref 30.0–36.0)
MCV: 86.2 fL (ref 80.0–100.0)
Monocytes Absolute: 0.7 10*3/uL (ref 0.1–1.0)
Monocytes Relative: 5 %
Neutro Abs: 13.5 10*3/uL — ABNORMAL HIGH (ref 1.7–7.7)
Neutrophils Relative %: 89 %
Platelets: 513 10*3/uL — ABNORMAL HIGH (ref 150–400)
RBC: 3.85 MIL/uL — ABNORMAL LOW (ref 3.87–5.11)
RDW: 17.8 % — ABNORMAL HIGH (ref 11.5–15.5)
WBC: 15.1 10*3/uL — ABNORMAL HIGH (ref 4.0–10.5)
nRBC: 0 % (ref 0.0–0.2)

## 2020-09-29 LAB — COMPREHENSIVE METABOLIC PANEL
ALT: 11 U/L (ref 0–44)
AST: 18 U/L (ref 15–41)
Albumin: 3.8 g/dL (ref 3.5–5.0)
Alkaline Phosphatase: 80 U/L (ref 38–126)
Anion gap: 13 (ref 5–15)
BUN: 17 mg/dL (ref 8–23)
CO2: 26 mmol/L (ref 22–32)
Calcium: 9.4 mg/dL (ref 8.9–10.3)
Chloride: 95 mmol/L — ABNORMAL LOW (ref 98–111)
Creatinine, Ser: 0.72 mg/dL (ref 0.44–1.00)
GFR, Estimated: >60 mL/min (ref >60)
Glucose, Bld: 134 mg/dL — ABNORMAL HIGH (ref 70–99)
Potassium: 3.4 mmol/L — ABNORMAL LOW (ref 3.5–5.1)
Sodium: 134 mmol/L — ABNORMAL LOW (ref 135–145)
Total Bilirubin: 0.1 mg/dL — ABNORMAL LOW (ref 0.3–1.2)
Total Protein: 8 g/dL (ref 6.5–8.1)

## 2020-09-29 LAB — URINALYSIS, MICROSCOPIC (REFLEX): Bacteria, UA: NONE SEEN

## 2020-09-29 LAB — LIPASE, BLOOD: Lipase: 23 U/L (ref 11–51)

## 2020-09-29 MED ORDER — SODIUM CHLORIDE 0.9 % IV SOLN
12.5000 mg | Freq: Once | INTRAVENOUS | Status: DC
  Filled 2020-09-29: qty 0.5

## 2020-09-29 MED ORDER — PROMETHAZINE HCL 25 MG RE SUPP: 12.5000 mg | Freq: Four times a day (QID) | RECTAL | 0 refills | Status: DC | PRN

## 2020-09-29 MED ORDER — SODIUM CHLORIDE 0.9 % IV BOLUS
500.0000 mL | Freq: Once | INTRAVENOUS | Status: AC
  Administered 2020-09-29: 500 mL via INTRAVENOUS

## 2020-09-29 MED ORDER — POTASSIUM CHLORIDE CRYS ER 20 MEQ PO TBCR
20.0000 meq | EXTENDED_RELEASE_TABLET | Freq: Once | ORAL | Status: AC
  Administered 2020-09-29: 20 meq via ORAL
  Filled 2020-09-29: qty 1

## 2020-09-29 MED ORDER — HYDROMORPHONE HCL 1 MG/ML IJ SOLN: 0.5000 mg | Freq: Once | INTRAMUSCULAR | Status: DC

## 2020-09-29 NOTE — Discharge Instructions (Addendum)
Use phenergan suppositories if unable to keep medicine down. Home to rest, drink hydrating fluids.  Follow up with your doctor, return to the ER if unable to control vomiting and pain at home.

## 2020-09-29 NOTE — ED Provider Notes (Signed)
[facility] EMERGENCY DEPARTMENT Provider Note   CSN: [id number] Arrival date & time: 09/29/20  1018     History Chief Complaint  Patient presents with  . Emesis    Leslie Boyd is a 75 y.o. female.  75 year old female with history of lung cancer and renal mass, seen in the ER with epigastric and back pain 2 days ago with nausea and vomiting, returns with ongoing nausea and vomiting as well as ongoing pain. Reports trying a phenergan suppository however had a bowel movement shortly after use and does not think it stayed in long enough to help, also unable to keep down regular medications including her blood pressure medication. Denies fevers, chills, changes in bowel or bladder habits.         Past Medical History:  Diagnosis Date  . Anxiety   . Arthritis   . Cancer ([place])   . Coronary artery calcification seen on CT scan    cardiologist-- dr Atilano Median [facility] in [facility])  . GERD (gastroesophageal reflux disease)   . History of kidney stones    ureter stent in place  . History of stress test    stress echo 01-29-2018 with dr Atilano Median, cardiologist with [facility] in [facility]  . Hypertension   . 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 Osteopenia   . Renal lesion    left renal pelvis  . Renal mass, left 08/30/2020    Patient Active Problem List   Diagnosis Date Noted  . Renal mass, left 08/30/2020  . Dementia with behavioral disturbance ([place]) 12/08/2019  . GAD (generalized anxiety disorder) 12/08/2019  . Gastroesophageal reflux disease without esophagitis 12/08/2019  . Intentional drug overdose ([place]) 12/08/2019  . Cognitive impairment 11/16/2019  . Dermatochalasis of both upper eyelids 04/26/2019  . Keratoconjunctivitis sicca of both eyes not specified as Sjogren's 04/26/2019  . Adenocarcinoma of left lung, stage 1 ([place]) 05/07/2018  . Aortic atherosclerosis ([place]) 03/15/2018  . Substance  abuse ([place])   . 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 Vitamin D deficiency 07/21/2017  . Coronary artery calcification seen on CAT scan 07/15/2017  . Episodic tobacco dependence 09/01/2016  . Essential hypertension 09/01/2016  . Nodule of left lung 09/01/2016  . Hypercholesteremia 07/18/2016  . Indigestion 03/17/2016  . Mixed emotional features as adjustment reaction 03/17/2016  . Osteopenia 03/17/2016    Past Surgical History:  Procedure Laterality Date  . CATARACT EXTRACTION W/ INTRAOCULAR LENS  IMPLANT, BILATERAL  2016  . [place]  . CYSTOSCOPY WITH RETROGRADE PYELOGRAM, URETEROSCOPY AND STENT PLACEMENT Left 04/27/2018   Procedure: CYSTOSCOPY WITH BILATERAL RETROGRADE PYELOGRAM, POSSIBLE LEFT URETEROSCOPY AND POSSIBLE STENT PLACEMENT;  Surgeon: Ceasar Mons, MD;  Location: [facility];  Service: Urology;  Laterality: Left;  . SKIN GRAFT    . TONSILLECTOMY  1963  . VIDEO ASSISTED THORACOSCOPY (VATS)/WEDGE RESECTION Left 05/03/2018   Procedure: VIDEO ASSISTED THORACOSCOPY (VATS)/WEDGE RESECTION;  Surgeon: [name], MD;  Location: [facility];  Service: Thoracic;  Laterality: Left;     OB History   No obstetric history on file.     Family History  Problem Relation Age of Onset  . Heart disease Father   . COPD Sister   . Heart disease Brother   . Colon cancer Neg Hx     Social History  Tobacco Use  . Smoking status: Current Every Day Smoker    Packs/day: 0.50    Years: 53.00    Pack years: 26.50    Types: Cigarettes  . Smokeless tobacco: Never Used  . Tobacco comment: smokes 4 cigarettes a day  Vaping Use  . Vaping Use: Never used  Substance Use Topics  . Alcohol use: Not Currently    Comment: occ  . Drug use: No    Home Medications Prior to Admission medications   Medication Sig Start Date End Date Taking? Authorizing Provider  promethazine (PHENERGAN) 25 MG suppository Place 0.5  suppositories (12.5 mg total) rectally every 6 (six) hours as needed for nausea or vomiting. 09/29/20  Yes Tacy Learn, PA-C  acetaminophen (TYLENOL) 500 MG tablet Take 1,000 mg by mouth every 8 (eight) hours as needed for moderate pain.    [provider]  amLODipine-atorvastatin (CADUET) 5-20 MG tablet Take 1 tablet by mouth daily.    [provider]  famotidine (PEPCID) 20 MG tablet Take 20 mg by mouth 2 (two) times daily. 02/27/19   [provider]  HYDROcodone-acetaminophen (NORCO/VICODIN) 5-325 MG tablet Take 1 tablet by mouth every 6 (six) hours as needed for moderate pain. 09/05/20   [name], MD  LORazepam (ATIVAN) 0.5 MG tablet Take 0.5 mg by mouth 2 (two) times daily. 06/22/18   [provider]  mirtazapine (REMERON) 15 MG tablet Take 15 mg by mouth at bedtime. 11/03/19   [provider]  OLANZapine (ZYPREXA) 7.5 MG tablet Take 7.5 mg by mouth daily. 12/16/19   [provider]  promethazine (PHENERGAN) 12.5 MG tablet Take 2 tablets (25 mg total) by mouth every 8 (eight) hours as needed for nausea or vomiting. 09/27/20   Hayden Rasmussen, MD  sertraline (ZOLOFT) 100 MG tablet Take 100 mg by mouth daily. 12/16/19   [provider]  Vitamin D, Ergocalciferol, (DRISDOL) 1.25 MG (50000 UNIT) CAPS capsule Take 50,000 Units by mouth every 7 (seven) days.    [provider]    Allergies    Ondansetron hcl, Morphine, and Morphine and related  Review of Systems   Review of Systems  Constitutional: Negative for chills and fever.  Respiratory: Negative for shortness of breath.   Cardiovascular: Negative for chest pain.  Gastrointestinal: Positive for abdominal pain, nausea and vomiting. Negative for blood in stool, constipation and diarrhea.  Genitourinary: Negative for dysuria and frequency.  Musculoskeletal: Positive for back pain.  Skin: Negative for rash and wound.  Allergic/Immunologic: Positive for  immunocompromised state.  Neurological: Negative for weakness.  Hematological: Negative for adenopathy. Does not bruise/bleed easily.  All other systems reviewed and are negative.   Physical Exam Updated Vital Signs BP (!) 151/76 (BP Location: Right Arm)   Pulse 82   Temp 98.3 F (36.8 C) (Oral)   Resp 17   Ht 5' (1.524 m)   Wt 36.3 kg   SpO2 99%   BMI 15.62 kg/m   Physical Exam Vitals and nursing note reviewed.  Constitutional:      General: She is not in acute distress.    Appearance: She is well-developed. She is not diaphoretic.     Comments: Thin, chronically ill appearing  HENT:     Head: Normocephalic and atraumatic.     Mouth/Throat:     Mouth: Mucous membranes are dry.  Cardiovascular:     Rate and Rhythm: Normal rate and regular rhythm.     Pulses: Normal pulses.  Heart sounds: Normal heart sounds.  Pulmonary:     Effort: Pulmonary effort is normal.     Breath sounds: Normal breath sounds.  Abdominal:     Palpations: Abdomen is soft.     Tenderness: There is no abdominal tenderness.  Musculoskeletal:     Right lower leg: No edema.     Left lower leg: No edema.  Skin:    General: Skin is warm and dry.     Findings: No erythema or rash.  Neurological:     Mental Status: She is alert and oriented to person, place, and time.  Psychiatric:        Behavior: Behavior normal.     ED Results / Procedures / Treatments   Labs (all labs ordered are listed, but only abnormal results are displayed) Labs Reviewed  CBC WITH DIFFERENTIAL/PLATELET - Abnormal; Notable for the following components:      Result Value   WBC 15.1 (*)    RBC 3.85 (*)    Hemoglobin 10.4 (*)    HCT 33.2 (*)    RDW 17.8 (*)    Platelets 513 (*)    Neutro Abs 13.5 (*)    All other components within normal limits  COMPREHENSIVE METABOLIC PANEL - Abnormal; Notable for the following components:   Sodium 134 (*)    Potassium 3.4 (*)    Chloride 95 (*)    Glucose, Bld 134 (*)     Total Bilirubin 0.1 (*)    All other components within normal limits  URINALYSIS, ROUTINE W REFLEX MICROSCOPIC - Abnormal; Notable for the following components:   Color, Urine STRAW (*)    Hgb urine dipstick TRACE (*)    All other components within normal limits  LIPASE, BLOOD  URINALYSIS, MICROSCOPIC (REFLEX)    EKG None  Radiology CT Angio Chest PE W/Cm &/Or Wo Cm  Result Date: 09/27/2020 CLINICAL DATA:  Epigastric abdominal pain, renal cell carcinoma, pulmonary mass EXAM: CT ANGIOGRAPHY CHEST CT ABDOMEN AND PELVIS WITH CONTRAST TECHNIQUE: Multidetector CT imaging of the chest was performed using the standard protocol during bolus administration of intravenous contrast. Multiplanar CT image reconstructions and MIPs were obtained to evaluate the vascular anatomy. Multidetector CT imaging of the abdomen and pelvis was performed using the standard protocol during bolus administration of intravenous contrast. CONTRAST:  13mL OMNIPAQUE IOHEXOL 350 MG/ML SOLN COMPARISON:  CT chest 03/02/2019, CT abdomen pelvis 08/21/2020, 04/02/2018 FINDINGS: CTA CHEST FINDINGS Cardiovascular: There is adequate opacification of the residual pulmonary arterial tree. There is no intraluminal filling defect identified to suggest acute pulmonary embolism. The central pulmonary arteries are of normal caliber. There is extensive multi-vessel coronary artery calcification. Global cardiac size within normal limits. No pericardial effusion. Mild atherosclerotic calcification is seen within the thoracic aorta. No aortic aneurysm. Mediastinum/Nodes: No enlarged mediastinal, hilar, or axillary lymph nodes. Thyroid gland, trachea, and esophagus demonstrate no significant findings. Lungs/Pleura: Partial left upper lobectomy has been performed. There is resultant left-sided volume loss. There is, however, mild pulmonary hyperinflation in keeping with changes of underlying COPD. No focal pulmonary nodules or infiltrates are identified.  There is segmental airway impaction within the a right lower lobe, nonspecific. Band like density within the right mainstem bronchus at the carina may represent a small amount of mucus. Mild scarring at the left lung base. No pneumothorax or pleural effusion. No central obstructing lesion. Musculoskeletal: Remote healed sternal fracture. No acute bone abnormality. No lytic or blastic bone lesions are identified. A left posterior chest wall  metastasis is seen adjacent to the left twelfth costovertebral junction measuring 1.5 x 1.8 cm at axial image # 85. Review of the MIP images confirms the above findings. CT ABDOMEN and PELVIS FINDINGS Hepatobiliary: No focal liver abnormality is seen. No gallstones, gallbladder wall thickening, or biliary dilatation. Pancreas: The pancreas is atrophic, but is otherwise unremarkable. The pancreatic duct is not dilated. Spleen: Unremarkable Adrenals/Urinary Tract: The adrenal glands are unremarkable. The right kidney is normal in size and position and demonstrates normal cortical enhancement. The bladder is unremarkable. The left kidney is expanded with loss of the normal cortical architecture. There is an expansile, infiltrative mass centered within the left renal pelvis and lower pole calices infiltrating into the mid and upper pole calices and expanding the left renal pelvis in keeping with an invasive urothelial carcinoma. Given the extensive infiltrative nature of thel mass, differentiation of the mass and residual left renal cortex is not possible. As such, the left kidney including the mass noted within the left renal pelvis measures roughly 8.1 x 9.5 x 11.1 cm in greatest dimension. Multiple simple cortical cysts arise exophytically from the upper pole of the left kidney, stable since remote prior examination. There is pathologic, necrotic left pararenal adenopathy with the index lymph node measuring 2.7 x 3.5 cm at axial image # 21/7. There is enhancing paravertebral soft  tissue at the diaphragmatic crus possibly representing pathologic retrocrural adenopathy measuring roughly 1.9 x 4.1 cm at axial image # 8. Stomach/Bowel: Moderate stool within the colon. The stomach, small bowel, and large bowel are otherwise unremarkable. Appendix normal. No free intraperitoneal gas or fluid. Vascular/Lymphatic: Moderate aortoiliac atherosclerotic calcification. No aortic aneurysm. The left renal vein is narrowed by mass effect from the adjacent pararenal adenopathy, but is still patent. Aside from a left pararenal and retrocrural adenopathy, no additional pathologic adenopathy is seen within the abdomen and pelvis. Reproductive: Uterus and bilateral adnexa are unremarkable. Other: No abdominal wall hernia. Musculoskeletal: No lytic or blastic bone lesions identified within the abdomen and pelvis. Review of the MIP images confirms the above findings. IMPRESSION: Infiltrative, expansile mass centered within the left renal pelvis and lower pole calices in keeping with a primary urothelial neoplasm with extensive infiltration of the left kidney and loss of the normal renal architecture. Associated left pararenal and retrocrural adenopathy. Left posterior chest wall metastasis adjacent to the left 12th costochondral junction. No thoracic metastatic disease. No pulmonary embolism. Extensive multi-vessel coronary artery calcification. COPD Aortic Atherosclerosis (ICD10-I70.0). Electronically Signed   By: Fidela Salisbury MD   On: 09/27/2020 15:31   CT Abdomen Pelvis W Contrast  Result Date: 09/27/2020 CLINICAL DATA:  Epigastric abdominal pain, renal cell carcinoma, pulmonary mass EXAM: CT ANGIOGRAPHY CHEST CT ABDOMEN AND PELVIS WITH CONTRAST TECHNIQUE: Multidetector CT imaging of the chest was performed using the standard protocol during bolus administration of intravenous contrast. Multiplanar CT image reconstructions and MIPs were obtained to evaluate the vascular anatomy. Multidetector CT imaging  of the abdomen and pelvis was performed using the standard protocol during bolus administration of intravenous contrast. CONTRAST:  60mL OMNIPAQUE IOHEXOL 350 MG/ML SOLN COMPARISON:  CT chest 03/02/2019, CT abdomen pelvis 08/21/2020, 04/02/2018 FINDINGS: CTA CHEST FINDINGS Cardiovascular: There is adequate opacification of the residual pulmonary arterial tree. There is no intraluminal filling defect identified to suggest acute pulmonary embolism. The central pulmonary arteries are of normal caliber. There is extensive multi-vessel coronary artery calcification. Global cardiac size within normal limits. No pericardial effusion. Mild atherosclerotic calcification is seen within the  thoracic aorta. No aortic aneurysm. Mediastinum/Nodes: No enlarged mediastinal, hilar, or axillary lymph nodes. Thyroid gland, trachea, and esophagus demonstrate no significant findings. Lungs/Pleura: Partial left upper lobectomy has been performed. There is resultant left-sided volume loss. There is, however, mild pulmonary hyperinflation in keeping with changes of underlying COPD. No focal pulmonary nodules or infiltrates are identified. There is segmental airway impaction within the a right lower lobe, nonspecific. Band like density within the right mainstem bronchus at the carina may represent a small amount of mucus. Mild scarring at the left lung base. No pneumothorax or pleural effusion. No central obstructing lesion. Musculoskeletal: Remote healed sternal fracture. No acute bone abnormality. No lytic or blastic bone lesions are identified. A left posterior chest wall metastasis is seen adjacent to the left twelfth costovertebral junction measuring 1.5 x 1.8 cm at axial image # 85. Review of the MIP images confirms the above findings. CT ABDOMEN and PELVIS FINDINGS Hepatobiliary: No focal liver abnormality is seen. No gallstones, gallbladder wall thickening, or biliary dilatation. Pancreas: The pancreas is atrophic, but is otherwise  unremarkable. The pancreatic duct is not dilated. Spleen: Unremarkable Adrenals/Urinary Tract: The adrenal glands are unremarkable. The right kidney is normal in size and position and demonstrates normal cortical enhancement. The bladder is unremarkable. The left kidney is expanded with loss of the normal cortical architecture. There is an expansile, infiltrative mass centered within the left renal pelvis and lower pole calices infiltrating into the mid and upper pole calices and expanding the left renal pelvis in keeping with an invasive urothelial carcinoma. Given the extensive infiltrative nature of thel mass, differentiation of the mass and residual left renal cortex is not possible. As such, the left kidney including the mass noted within the left renal pelvis measures roughly 8.1 x 9.5 x 11.1 cm in greatest dimension. Multiple simple cortical cysts arise exophytically from the upper pole of the left kidney, stable since remote prior examination. There is pathologic, necrotic left pararenal adenopathy with the index lymph node measuring 2.7 x 3.5 cm at axial image # 21/7. There is enhancing paravertebral soft tissue at the diaphragmatic crus possibly representing pathologic retrocrural adenopathy measuring roughly 1.9 x 4.1 cm at axial image # 8. Stomach/Bowel: Moderate stool within the colon. The stomach, small bowel, and large bowel are otherwise unremarkable. Appendix normal. No free intraperitoneal gas or fluid. Vascular/Lymphatic: Moderate aortoiliac atherosclerotic calcification. No aortic aneurysm. The left renal vein is narrowed by mass effect from the adjacent pararenal adenopathy, but is still patent. Aside from a left pararenal and retrocrural adenopathy, no additional pathologic adenopathy is seen within the abdomen and pelvis. Reproductive: Uterus and bilateral adnexa are unremarkable. Other: No abdominal wall hernia. Musculoskeletal: No lytic or blastic bone lesions identified within the abdomen  and pelvis. Review of the MIP images confirms the above findings. IMPRESSION: Infiltrative, expansile mass centered within the left renal pelvis and lower pole calices in keeping with a primary urothelial neoplasm with extensive infiltration of the left kidney and loss of the normal renal architecture. Associated left pararenal and retrocrural adenopathy. Left posterior chest wall metastasis adjacent to the left 12th costochondral junction. No thoracic metastatic disease. No pulmonary embolism. Extensive multi-vessel coronary artery calcification. COPD Aortic Atherosclerosis (ICD10-I70.0). Electronically Signed   By: Fidela Salisbury MD   On: 09/27/2020 15:31    Procedures Procedures   Medications Ordered in ED Medications  HYDROmorphone (DILAUDID) injection 0.5 mg (0 mg Intravenous Hold 09/29/20 1132)  promethazine (PHENERGAN) 12.5 mg in sodium chloride 0.9 %  50 mL IVPB (0 mg Intravenous Hold 09/29/20 1203)  sodium chloride 0.9 % bolus 500 mL (0 mLs Intravenous Stopped 09/29/20 1225)  potassium chloride SA (KLOR-CON) CR tablet 20 mEq (20 mEq Oral Given 09/29/20 1323)    ED Course  I have reviewed the triage vital signs and the nursing notes.  Pertinent labs & imaging results that were available during my care of the patient were reviewed by me and considered in my medical decision making (see chart for details).  Clinical Course as of 09/29/20 1341  Sat Sep 30, 5063  [id number] 75 year old female with vomiting and pain as above, recurrent problem, no new symptoms today.  Unable to tolerate medications and fluids at home.  On exam, chronically ill-appearing although nontoxic.  Abdomen soft and nontender with normal bowel sounds. Patient was given IV fluids, plan was for IV morphine and Phenergan however IV Phenergan is not available at this time at this facility, patient declines IV morphine, states that she took pain medication at home prior to taking and is not due for another dose for 10 hours. After  her small fluid bolus, patient states that she is feeling much better, is able to tolerate oral fluids and is ready for discharge home. Review of labs, leukocytosis white count of 15.1, not significantly changed from prior, hemoglobin of 10.4, not significantly changed.  CMP with mild hypokalemia with potassium of 3.4, given oral dose prior to discharge.  Urinalysis with trace hemoglobin without evidence of infection, lipase within normal limits. Patient is discharged home with prescription for Phenergan suppositories, advised return to the ED for uncontrollable pain or vomiting otherwise follow-up with her provider. [LM]    Clinical Course User Index [LM] Roque Lias   MDM Rules/Calculators/A&P                          Final Clinical Impression(s) / ED Diagnoses Final diagnoses:  Non-intractable vomiting with nausea, unspecified vomiting type    Rx / DC Orders ED Discharge Orders         Ordered    promethazine (PHENERGAN) 25 MG suppository  Every 6 hours PRN        09/29/20 1333           Tacy Learn, PA-C 09/29/20 1341    Lennice Sites, DO 09/29/20 1343

## 2020-09-29 NOTE — ED Notes (Signed)
Patient's son was  Called. Waiting for ride to pick patient up.

## 2020-09-29 NOTE — ED Triage Notes (Signed)
Pt reports vomiting onset 10 pm last night. Pt seen here for same on Wednesday. Pt reports that she is waiting on blood test results for her Chemo therapy.

## 2020-10-05 ENCOUNTER — Other Ambulatory Visit: Payer: Self-pay

## 2020-10-05 MED ORDER — HYDROCODONE-ACETAMINOPHEN 5-325 MG PO TABS: 1.0000 | ORAL_TABLET | Freq: Four times a day (QID) | ORAL | 0 refills | Status: DC | PRN

## 2020-10-11 ENCOUNTER — Other Ambulatory Visit: Payer: Medicare HMO

## 2020-10-11 ENCOUNTER — Ambulatory Visit: Payer: Medicare HMO | Admitting: Hematology & Oncology

## 2020-10-16 ENCOUNTER — Inpatient Hospital Stay (HOSPITAL_BASED_OUTPATIENT_CLINIC_OR_DEPARTMENT_OTHER)
Admission: EM | Admit: 2020-10-16 | Discharge: 2020-10-25 | DRG: 082 | Disposition: A | Discharge status: Hospice | Payer: Medicare HMO | Attending: Surgery | Admitting: Surgery

## 2020-10-16 ENCOUNTER — Emergency Department (HOSPITAL_BASED_OUTPATIENT_CLINIC_OR_DEPARTMENT_OTHER): Payer: Medicare HMO

## 2020-10-16 ENCOUNTER — Emergency Department (HOSPITAL_COMMUNITY): Payer: Medicare HMO

## 2020-10-16 ENCOUNTER — Encounter: Payer: Self-pay | Admitting: *Deleted

## 2020-10-16 ENCOUNTER — Encounter (HOSPITAL_COMMUNITY): Payer: Self-pay

## 2020-10-16 ENCOUNTER — Encounter (HOSPITAL_BASED_OUTPATIENT_CLINIC_OR_DEPARTMENT_OTHER): Payer: Self-pay | Admitting: Emergency Medicine

## 2020-10-16 ENCOUNTER — Other Ambulatory Visit: Payer: Self-pay

## 2020-10-16 ENCOUNTER — Inpatient Hospital Stay (HOSPITAL_COMMUNITY): Payer: Medicare HMO

## 2020-10-16 DIAGNOSIS — R54 Age-related physical debility: Secondary | ICD-10-CM | POA: Diagnosis present

## 2020-10-16 DIAGNOSIS — S12190A Other displaced fracture of second cervical vertebra, initial encounter for closed fracture: Secondary | ICD-10-CM | POA: Diagnosis present

## 2020-10-16 DIAGNOSIS — F17210 Nicotine dependence, cigarettes, uncomplicated: Secondary | ICD-10-CM | POA: Diagnosis present

## 2020-10-16 DIAGNOSIS — Z66 Do not resuscitate: Secondary | ICD-10-CM | POA: Diagnosis not present

## 2020-10-16 DIAGNOSIS — C649 Malignant neoplasm of unspecified kidney, except renal pelvis: Secondary | ICD-10-CM | POA: Diagnosis present

## 2020-10-16 DIAGNOSIS — Y92003 Bedroom of unspecified non-institutional (private) residence as the place of occurrence of the external cause: Secondary | ICD-10-CM | POA: Diagnosis not present

## 2020-10-16 DIAGNOSIS — Z20822 Contact with and (suspected) exposure to covid-19: Secondary | ICD-10-CM | POA: Diagnosis present

## 2020-10-16 DIAGNOSIS — E43 Unspecified severe protein-calorie malnutrition: Secondary | ICD-10-CM | POA: Insufficient documentation

## 2020-10-16 DIAGNOSIS — S066X9A Traumatic subarachnoid hemorrhage with loss of consciousness of unspecified duration, initial encounter: Principal | ICD-10-CM | POA: Diagnosis present

## 2020-10-16 DIAGNOSIS — C641 Malignant neoplasm of right kidney, except renal pelvis: Secondary | ICD-10-CM | POA: Diagnosis not present

## 2020-10-16 DIAGNOSIS — I619 Nontraumatic intracerebral hemorrhage, unspecified: Secondary | ICD-10-CM | POA: Diagnosis present

## 2020-10-16 DIAGNOSIS — R4182 Altered mental status, unspecified: Secondary | ICD-10-CM | POA: Diagnosis not present

## 2020-10-16 DIAGNOSIS — Z79899 Other long term (current) drug therapy: Secondary | ICD-10-CM

## 2020-10-16 DIAGNOSIS — Z515 Encounter for palliative care: Secondary | ICD-10-CM | POA: Diagnosis not present

## 2020-10-16 DIAGNOSIS — F411 Generalized anxiety disorder: Secondary | ICD-10-CM | POA: Diagnosis present

## 2020-10-16 DIAGNOSIS — C7951 Secondary malignant neoplasm of bone: Secondary | ICD-10-CM | POA: Diagnosis not present

## 2020-10-16 DIAGNOSIS — S0232XA Fracture of orbital floor, left side, initial encounter for closed fracture: Secondary | ICD-10-CM | POA: Diagnosis present

## 2020-10-16 DIAGNOSIS — J449 Chronic obstructive pulmonary disease, unspecified: Secondary | ICD-10-CM | POA: Diagnosis present

## 2020-10-16 DIAGNOSIS — K5909 Other constipation: Secondary | ICD-10-CM | POA: Diagnosis not present

## 2020-10-16 DIAGNOSIS — Z9151 Personal history of suicidal behavior: Secondary | ICD-10-CM

## 2020-10-16 DIAGNOSIS — S065X0A Traumatic subdural hemorrhage without loss of consciousness, initial encounter: Secondary | ICD-10-CM | POA: Diagnosis not present

## 2020-10-16 DIAGNOSIS — W1830XA Fall on same level, unspecified, initial encounter: Secondary | ICD-10-CM | POA: Diagnosis present

## 2020-10-16 DIAGNOSIS — D72828 Other elevated white blood cell count: Secondary | ICD-10-CM | POA: Diagnosis present

## 2020-10-16 DIAGNOSIS — R64 Cachexia: Secondary | ICD-10-CM | POA: Diagnosis present

## 2020-10-16 DIAGNOSIS — K219 Gastro-esophageal reflux disease without esophagitis: Secondary | ICD-10-CM | POA: Diagnosis present

## 2020-10-16 DIAGNOSIS — F0390 Unspecified dementia without behavioral disturbance: Secondary | ICD-10-CM | POA: Diagnosis present

## 2020-10-16 DIAGNOSIS — I10 Essential (primary) hypertension: Secondary | ICD-10-CM | POA: Diagnosis present

## 2020-10-16 DIAGNOSIS — M8580 Other specified disorders of bone density and structure, unspecified site: Secondary | ICD-10-CM | POA: Diagnosis present

## 2020-10-16 DIAGNOSIS — F419 Anxiety disorder, unspecified: Secondary | ICD-10-CM | POA: Diagnosis present

## 2020-10-16 DIAGNOSIS — I2510 Atherosclerotic heart disease of native coronary artery without angina pectoris: Secondary | ICD-10-CM | POA: Diagnosis present

## 2020-10-16 DIAGNOSIS — Z7189 Other specified counseling: Secondary | ICD-10-CM | POA: Diagnosis not present

## 2020-10-16 DIAGNOSIS — R627 Adult failure to thrive: Secondary | ICD-10-CM | POA: Diagnosis not present

## 2020-10-16 DIAGNOSIS — W19XXXA Unspecified fall, initial encounter: Secondary | ICD-10-CM | POA: Diagnosis not present

## 2020-10-16 DIAGNOSIS — E559 Vitamin D deficiency, unspecified: Secondary | ICD-10-CM | POA: Diagnosis present

## 2020-10-16 DIAGNOSIS — Z85118 Personal history of other malignant neoplasm of bronchus and lung: Secondary | ICD-10-CM

## 2020-10-16 DIAGNOSIS — S02832A Fracture of medial orbital wall, left side, initial encounter for closed fracture: Secondary | ICD-10-CM | POA: Diagnosis present

## 2020-10-16 DIAGNOSIS — Z681 Body mass index (BMI) 19 or less, adult: Secondary | ICD-10-CM

## 2020-10-16 DIAGNOSIS — D72829 Elevated white blood cell count, unspecified: Secondary | ICD-10-CM

## 2020-10-16 DIAGNOSIS — R531 Weakness: Secondary | ICD-10-CM | POA: Diagnosis not present

## 2020-10-16 DIAGNOSIS — R451 Restlessness and agitation: Secondary | ICD-10-CM | POA: Diagnosis not present

## 2020-10-16 DIAGNOSIS — F0391 Unspecified dementia with behavioral disturbance: Secondary | ICD-10-CM | POA: Diagnosis not present

## 2020-10-16 DIAGNOSIS — E7800 Pure hypercholesterolemia, unspecified: Secondary | ICD-10-CM | POA: Diagnosis present

## 2020-10-16 LAB — TROPONIN I (HIGH SENSITIVITY)
Troponin I (High Sensitivity): 9 ng/L (ref <18)
Troponin I (High Sensitivity): 7 ng/L (ref <18)

## 2020-10-16 LAB — I-STAT CHEM 8, ED
BUN: 16 mg/dL (ref 8–23)
Calcium, Ion: 1.17 mmol/L (ref 1.15–1.40)
Chloride: 101 mmol/L (ref 98–111)
Creatinine, Ser: 0.7 mg/dL (ref 0.44–1.00)
Glucose, Bld: 109 mg/dL — ABNORMAL HIGH (ref 70–99)
HCT: 27 % — ABNORMAL LOW (ref 36.0–46.0)
Hemoglobin: 9.2 g/dL — ABNORMAL LOW (ref 12.0–15.0)
Potassium: 3.9 mmol/L (ref 3.5–5.1)
Sodium: 139 mmol/L (ref 135–145)
TCO2: 27 mmol/L (ref 22–32)

## 2020-10-16 LAB — COMPREHENSIVE METABOLIC PANEL
ALT: 14 U/L (ref 0–44)
AST: 22 U/L (ref 15–41)
Albumin: 3.7 g/dL (ref 3.5–5.0)
Alkaline Phosphatase: 77 U/L (ref 38–126)
Anion gap: 11 (ref 5–15)
BUN: 20 mg/dL (ref 8–23)
CO2: 31 mmol/L (ref 22–32)
Calcium: 9.3 mg/dL (ref 8.9–10.3)
Chloride: 97 mmol/L — ABNORMAL LOW (ref 98–111)
Creatinine, Ser: 0.94 mg/dL (ref 0.44–1.00)
GFR, Estimated: >60 mL/min (ref >60)
Glucose, Bld: 135 mg/dL — ABNORMAL HIGH (ref 70–99)
Potassium: 4 mmol/L (ref 3.5–5.1)
Sodium: 139 mmol/L (ref 135–145)
Total Bilirubin: 0.4 mg/dL (ref 0.3–1.2)
Total Protein: 7.6 g/dL (ref 6.5–8.1)

## 2020-10-16 LAB — URINALYSIS, ROUTINE W REFLEX MICROSCOPIC
Bilirubin Urine: NEGATIVE
Glucose, UA: NEGATIVE mg/dL
Hgb urine dipstick: NEGATIVE
Ketones, ur: NEGATIVE mg/dL
Leukocytes,Ua: NEGATIVE
Nitrite: NEGATIVE
Protein, ur: NEGATIVE mg/dL
Specific Gravity, Urine: >1.046 — ABNORMAL HIGH (ref 1.005–1.030)
pH: 9 — ABNORMAL HIGH (ref 5.0–8.0)

## 2020-10-16 LAB — SARS CORONAVIRUS 2 (TAT 6-24 HRS): SARS Coronavirus 2: NEGATIVE

## 2020-10-16 LAB — CK: Total CK: 187 U/L (ref 38–234)

## 2020-10-16 LAB — SAMPLE TO BLOOD BANK

## 2020-10-16 LAB — CBC
HCT: 34.2 % — ABNORMAL LOW (ref 36.0–46.0)
Hemoglobin: 10.4 g/dL — ABNORMAL LOW (ref 12.0–15.0)
MCH: 26.7 pg (ref 26.0–34.0)
MCHC: 30.4 g/dL (ref 30.0–36.0)
MCV: 87.7 fL (ref 80.0–100.0)
Platelets: 467 10*3/uL — ABNORMAL HIGH (ref 150–400)
RBC: 3.9 MIL/uL (ref 3.87–5.11)
RDW: 16.7 % — ABNORMAL HIGH (ref 11.5–15.5)
WBC: 16.1 10*3/uL — ABNORMAL HIGH (ref 4.0–10.5)
nRBC: 0 % (ref 0.0–0.2)

## 2020-10-16 LAB — ACETAMINOPHEN LEVEL: Acetaminophen (Tylenol), Serum: <10 ug/mL — ABNORMAL LOW (ref 10–30)

## 2020-10-16 LAB — SALICYLATE LEVEL: Salicylate Lvl: <7 mg/dL — ABNORMAL LOW (ref 7.0–30.0)

## 2020-10-16 LAB — RESP PANEL BY RT-PCR (FLU A&B, COVID) ARPGX2
Influenza A by PCR: NEGATIVE
Influenza B by PCR: NEGATIVE
SARS Coronavirus 2 by RT PCR: NEGATIVE

## 2020-10-16 LAB — PROTIME-INR
INR: 1.1 (ref 0.8–1.2)
Prothrombin Time: 13.7 seconds (ref 11.4–15.2)

## 2020-10-16 LAB — RAPID URINE DRUG SCREEN, HOSP PERFORMED
Amphetamines: NOT DETECTED
Barbiturates: NOT DETECTED
Benzodiazepines: NOT DETECTED
Cocaine: NOT DETECTED
Opiates: POSITIVE — AB
Tetrahydrocannabinol: NOT DETECTED

## 2020-10-16 LAB — ETHANOL: Alcohol, Ethyl (B): <10 mg/dL (ref <10)

## 2020-10-16 LAB — LACTIC ACID, PLASMA: Lactic Acid, Venous: 1.5 mmol/L (ref 0.5–1.9)

## 2020-10-16 MED ORDER — ACETAMINOPHEN 160 MG/5ML PO SOLN
1000.0000 mg | Freq: Four times a day (QID) | ORAL | Status: DC
  Administered 2020-10-16 – 2020-10-17 (×2): 1000 mg via ORAL
  Filled 2020-10-16 (×2): qty 40.6

## 2020-10-16 MED ORDER — FENTANYL CITRATE (PF) 100 MCG/2ML IJ SOLN
25.0000 ug | Freq: Once | INTRAMUSCULAR | Status: AC
Start: 2020-10-16 — End: 2020-10-16
  Administered 2020-10-16: 25 ug via INTRAVENOUS
  Filled 2020-10-16: qty 2

## 2020-10-16 MED ORDER — LEVETIRACETAM IN NACL 500 MG/100ML IV SOLN
500.0000 mg | Freq: Two times a day (BID) | INTRAVENOUS | Status: DC
  Administered 2020-10-16 – 2020-10-17 (×4): 500 mg via INTRAVENOUS
  Filled 2020-10-16 (×4): qty 100

## 2020-10-16 MED ORDER — BOOST / RESOURCE BREEZE PO LIQD CUSTOM
1.0000 | Freq: Three times a day (TID) | ORAL | Status: DC
  Administered 2020-10-16 – 2020-10-17 (×2): 1 via ORAL

## 2020-10-16 MED ORDER — CEFAZOLIN SODIUM-DEXTROSE 2-4 GM/100ML-% IV SOLN
2.0000 g | Freq: Once | INTRAVENOUS | Status: AC
  Administered 2020-10-16: 2 g via INTRAVENOUS
  Filled 2020-10-16: qty 100

## 2020-10-16 MED ORDER — METHOCARBAMOL 500 MG PO TABS: 1000.0000 mg | ORAL_TABLET | Freq: Three times a day (TID) | ORAL | Status: DC

## 2020-10-16 MED ORDER — SODIUM CHLORIDE 0.9 % IV BOLUS
1000.0000 mL | Freq: Once | INTRAVENOUS | Status: AC
  Administered 2020-10-16: 1000 mL via INTRAVENOUS

## 2020-10-16 MED ORDER — DOCUSATE SODIUM 100 MG PO CAPS
100.0000 mg | ORAL_CAPSULE | Freq: Two times a day (BID) | ORAL | Status: DC
  Administered 2020-10-16 – 2020-10-25 (×11): 100 mg via ORAL
  Filled 2020-10-16 (×14): qty 1

## 2020-10-16 MED ORDER — ACETAMINOPHEN 500 MG PO TABS: 1000.0000 mg | ORAL_TABLET | Freq: Four times a day (QID) | ORAL | Status: DC

## 2020-10-16 MED ORDER — METHOCARBAMOL 1000 MG/10ML IJ SOLN
1000.0000 mg | Freq: Three times a day (TID) | INTRAVENOUS | Status: DC
  Administered 2020-10-16 – 2020-10-17 (×2): 1000 mg via INTRAVENOUS
  Filled 2020-10-16 (×5): qty 10

## 2020-10-16 MED ORDER — HALOPERIDOL LACTATE 5 MG/ML IJ SOLN
INTRAMUSCULAR | Status: AC
  Administered 2020-10-16: 2.5 mg via INTRAMUSCULAR
  Filled 2020-10-16: qty 1

## 2020-10-16 MED ORDER — IOHEXOL 350 MG/ML SOLN
40.0000 mL | Freq: Once | INTRAVENOUS | Status: AC | PRN
  Administered 2020-10-16: 40 mL via INTRAVENOUS

## 2020-10-16 MED ORDER — HALOPERIDOL LACTATE 5 MG/ML IJ SOLN
2.5000 mg | Freq: Four times a day (QID) | INTRAMUSCULAR | Status: DC | PRN
  Administered 2020-10-16 – 2020-10-17 (×2): 2.5 mg via INTRAMUSCULAR
  Filled 2020-10-16 (×2): qty 1

## 2020-10-16 MED ORDER — IOHEXOL 350 MG/ML SOLN
100.0000 mL | Freq: Once | INTRAVENOUS | Status: AC | PRN
  Administered 2020-10-16: 100 mL via INTRAVENOUS

## 2020-10-16 MED ORDER — SODIUM CHLORIDE 0.9 % IV SOLN: INTRAVENOUS | Status: DC

## 2020-10-16 NOTE — Progress Notes (Signed)
Notified of patient admission after traumatic fall. She was scheduled to come into the office tomorrow for treatment discussion. These appointments have been cancelled. Will follow admission for post discharge needs.  Oncology Nurse Navigator Documentation  Oncology Nurse Navigator Flowsheets 10/16/2020  Abnormal Finding Date -  Confirmed Diagnosis Date -  Diagnosis Status -  Expected Surgery Date -  Navigator Follow Up Date: 10/23/2020  Navigator Follow Up Reason: Appointment Review  Navigator Location [facility]  Referral Date to RadOnc/MedOnc -  Navigator Encounter Type Appt/Treatment Plan Review  Telephone -  Patient Visit Type MedOnc  Treatment Phase Pre-Tx/Tx Discussion  Barriers/Navigation Needs Coordination of Care;Education;Family Concerns;Personal Conflicts  Education -  Interventions None Required  Acuity Level 4-High Needs (Greater Than 4 Barriers Identified)  Coordination of Care -  Education Method -  Support Groups/Services Friends and Family  Time Spent with Patient 15

## 2020-10-16 NOTE — ED Notes (Signed)
Patient transported to CT 

## 2020-10-16 NOTE — Plan of Care (Signed)
  Problem: Clinical Measurements: Goal: Cardiovascular complication will be avoided Outcome: Progressing   Problem: Clinical Measurements: Goal: Will remain free from infection Outcome: Progressing   Problem: Coping: Goal: Level of anxiety will decrease Outcome: Progressing   Problem: Elimination: Goal: Will not experience complications related to urinary retention Outcome: Progressing
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
## 2020-10-16 NOTE — ED Notes (Signed)
Report from [place], [place]

## 2020-10-16 NOTE — ED Notes (Signed)
Report given to Trudee Kuster, RN at [facility] ED.  [facility] report in progress with Charge RN
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
## 2020-10-16 NOTE — H&P (Signed)
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 MCA possibly representing intraluminal thrombus. No associated large cortical infarct. This should be further evaluated with CT arteriography. Multiple left facial fractures as noted above in keeping with a tetrapod fracture. Large inferior orbital wall fracture with herniation of the inferior rectus through the defect. Acute teardrop fracture of the anteroinferior aspect of C2 in keeping with a hyperflexion injury. No associated listhesis. These results were called by telephone at the time of interpretation on 10/16/2020 at 7:06 am to provider [facility] , who verbally acknowledged these results. 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 Brain perfusion study pending. Formal consults pending.   Norm Parcel, [facility] 10/16/2020, 9:26 AM Please see Amion for pager number during day  hours 7:00am-4:30pm

## 2020-10-16 NOTE — Plan of Care (Signed)
  Problem: Clinical Measurements: Goal: Ability to maintain clinical measurements within normal limits will improve 10/16/2020 1746 by Shirlyn Goltz, RN Outcome: Progressing 10/16/2020 1505 by Shirlyn Goltz, RN Outcome: Progressing   Problem: Clinical Measurements: Goal: Cardiovascular complication will be avoided 10/16/2020 1746 by Shirlyn Goltz, RN Outcome: Progressing 10/16/2020 1505 by Shirlyn Goltz, RN Outcome: Progressing   Problem: Clinical Measurements: Goal: Respiratory complications will improve 10/16/2020 1746 by Shirlyn Goltz, RN Outcome: Progressing 10/16/2020 1505 by Shirlyn Goltz, RN Outcome: Progressing   Problem: Coping: Goal: Level of anxiety will decrease 10/16/2020 1746 by Shirlyn Goltz, RN Outcome: Progressing 10/16/2020 1505 by Shirlyn Goltz, RN Outcome: Progressing   Problem: Pain Managment: Goal: General experience of comfort will improve 10/16/2020 1746 by Shirlyn Goltz, RN Outcome: Progressing 10/16/2020 1505 by Shirlyn Goltz, RN Outcome: Progressing   Problem: Safety: Goal: Ability to remain free from injury will improve 10/16/2020 1746 by Shirlyn Goltz, RN Outcome: Progressing 10/16/2020 1505 by Shirlyn Goltz, RN Outcome: Progressing

## 2020-10-16 NOTE — ED Notes (Signed)
Pt left with [facility].  Stable at transfer time.

## 2020-10-16 NOTE — ED Notes (Signed)
Dr. Cardama at bedside.  
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
## 2020-10-17 ENCOUNTER — Other Ambulatory Visit: Payer: Self-pay

## 2020-10-17 ENCOUNTER — Inpatient Hospital Stay (HOSPITAL_COMMUNITY): Payer: Medicare HMO

## 2020-10-17 ENCOUNTER — Other Ambulatory Visit: Payer: Medicare HMO

## 2020-10-17 ENCOUNTER — Ambulatory Visit: Payer: Medicare HMO | Admitting: Hematology & Oncology

## 2020-10-17 DIAGNOSIS — E43 Unspecified severe protein-calorie malnutrition: Secondary | ICD-10-CM | POA: Insufficient documentation

## 2020-10-17 LAB — CBC
HCT: 30.5 % — ABNORMAL LOW (ref 36.0–46.0)
Hemoglobin: 9.5 g/dL — ABNORMAL LOW (ref 12.0–15.0)
MCH: 27.3 pg (ref 26.0–34.0)
MCHC: 31.1 g/dL (ref 30.0–36.0)
MCV: 87.6 fL (ref 80.0–100.0)
Platelets: 446 10*3/uL — ABNORMAL HIGH (ref 150–400)
RBC: 3.48 MIL/uL — ABNORMAL LOW (ref 3.87–5.11)
RDW: 16.5 % — ABNORMAL HIGH (ref 11.5–15.5)
WBC: 13.7 10*3/uL — ABNORMAL HIGH (ref 4.0–10.5)
nRBC: 0 % (ref 0.0–0.2)

## 2020-10-17 LAB — MRSA PCR SCREENING: MRSA by PCR: NEGATIVE

## 2020-10-17 LAB — BASIC METABOLIC PANEL
Anion gap: 8 (ref 5–15)
BUN: 7 mg/dL — ABNORMAL LOW (ref 8–23)
CO2: 26 mmol/L (ref 22–32)
Calcium: 8.9 mg/dL (ref 8.9–10.3)
Chloride: 103 mmol/L (ref 98–111)
Creatinine, Ser: 0.73 mg/dL (ref 0.44–1.00)
GFR, Estimated: >60 mL/min (ref >60)
Glucose, Bld: 115 mg/dL — ABNORMAL HIGH (ref 70–99)
Potassium: 3.3 mmol/L — ABNORMAL LOW (ref 3.5–5.1)
Sodium: 137 mmol/L (ref 135–145)

## 2020-10-17 MED ORDER — AMLODIPINE-ATORVASTATIN 5-20 MG PO TABS: 1.0000 | ORAL_TABLET | Freq: Every day | ORAL | Status: DC

## 2020-10-17 MED ORDER — OLANZAPINE 5 MG PO TABS
7.5000 mg | ORAL_TABLET | Freq: Every day | ORAL | Status: DC
  Administered 2020-10-17 – 2020-10-25 (×9): 7.5 mg via ORAL
  Filled 2020-10-17 (×3): qty 2
  Filled 2020-10-17: qty 1
  Filled 2020-10-17 (×5): qty 2

## 2020-10-17 MED ORDER — LORAZEPAM 2 MG/ML IJ SOLN
0.2500 mg | Freq: Four times a day (QID) | INTRAMUSCULAR | Status: DC | PRN
  Administered 2020-10-17: 0.25 mg via INTRAVENOUS
  Filled 2020-10-17: qty 1

## 2020-10-17 MED ORDER — LORAZEPAM 0.5 MG PO TABS
0.2500 mg | ORAL_TABLET | Freq: Four times a day (QID) | ORAL | Status: DC | PRN
  Administered 2020-10-17 – 2020-10-20 (×4): 0.25 mg via ORAL
  Filled 2020-10-17 (×4): qty 1

## 2020-10-17 MED ORDER — PROMETHAZINE HCL 12.5 MG RE SUPP
12.5000 mg | Freq: Four times a day (QID) | RECTAL | Status: DC | PRN
  Filled 2020-10-17: qty 1

## 2020-10-17 MED ORDER — ENSURE ENLIVE PO LIQD
237.0000 mL | Freq: Two times a day (BID) | ORAL | Status: DC
  Administered 2020-10-17 – 2020-10-23 (×7): 237 mL via ORAL

## 2020-10-17 MED ORDER — ATORVASTATIN CALCIUM 10 MG PO TABS
20.0000 mg | ORAL_TABLET | Freq: Every day | ORAL | Status: DC
  Administered 2020-10-17 – 2020-10-24 (×8): 20 mg via ORAL
  Filled 2020-10-17 (×8): qty 2

## 2020-10-17 MED ORDER — POTASSIUM CHLORIDE 20 MEQ PO PACK
40.0000 meq | PACK | Freq: Once | ORAL | Status: AC
  Administered 2020-10-17: 40 meq via ORAL
  Filled 2020-10-17: qty 2

## 2020-10-17 MED ORDER — AMLODIPINE BESYLATE 5 MG PO TABS
5.0000 mg | ORAL_TABLET | Freq: Every day | ORAL | Status: DC
  Administered 2020-10-17 – 2020-10-25 (×9): 5 mg via ORAL
  Filled 2020-10-17 (×9): qty 1

## 2020-10-17 MED ORDER — ACETAMINOPHEN 160 MG/5ML PO SOLN
1000.0000 mg | Freq: Four times a day (QID) | ORAL | Status: DC
  Administered 2020-10-17 (×3): 1000 mg via ORAL
  Filled 2020-10-17 (×2): qty 40.6

## 2020-10-17 MED ORDER — CHLORHEXIDINE GLUCONATE CLOTH 2 % EX PADS
6.0000 | MEDICATED_PAD | Freq: Every day | CUTANEOUS | Status: DC
  Administered 2020-10-17 – 2020-10-22 (×5): 6 via TOPICAL

## 2020-10-17 MED ORDER — METHOCARBAMOL 1000 MG/10ML IJ SOLN
1000.0000 mg | Freq: Three times a day (TID) | INTRAVENOUS | Status: DC
  Administered 2020-10-17 – 2020-10-18 (×3): 1000 mg via INTRAVENOUS
  Filled 2020-10-17 (×5): qty 10

## 2020-10-17 MED ORDER — SERTRALINE HCL 50 MG PO TABS
150.0000 mg | ORAL_TABLET | Freq: Every day | ORAL | Status: DC
  Administered 2020-10-17 – 2020-10-25 (×9): 150 mg via ORAL
  Filled 2020-10-17 (×2): qty 1
  Filled 2020-10-17: qty 3
  Filled 2020-10-17 (×6): qty 1

## 2020-10-17 MED ORDER — ACETAMINOPHEN 160 MG/5ML PO SOLN
1000.0000 mg | Freq: Four times a day (QID) | ORAL | Status: DC
  Filled 2020-10-17: qty 40.6

## 2020-10-17 MED ORDER — MIRTAZAPINE 15 MG PO TABS
15.0000 mg | ORAL_TABLET | Freq: Every day | ORAL | Status: DC
  Administered 2020-10-17 – 2020-10-23 (×7): 15 mg via ORAL
  Filled 2020-10-17 (×8): qty 1

## 2020-10-17 NOTE — Progress Notes (Signed)
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 10/16/2020 09:38   DG Chest Port 1 View  Result Date: 10/16/2020 CLINICAL DATA:  Head lacerations following a fall. EXAM: PORTABLE CHEST 1 VIEW COMPARISON:  Earlier today. FINDINGS: Normal sized heart. Tortuous aorta. Stable medial left upper lung surgical staples. Stable mildly hyperexpanded lungs with mildly prominent interstitial markings. Mild levoconvex scoliosis. IMPRESSION: 1. No acute abnormality. 2. Stable mild changes of COPD. Electronically Signed   By: Claudie Revering M.D.   On: 10/16/2020 09:54   CT MAXILLOFACIAL WO CONTRAST  Result Date: 10/16/2020 CLINICAL DATA:  Found down EXAM: CT MAXILLOFACIAL WITHOUT CONTRAST TECHNIQUE: Multidetector CT imaging of the maxillofacial structures was performed. Multiplanar CT image reconstructions were also generated. COMPARISON:  None. FINDINGS: Osseous: Fractures through the anterior and posterior walls of the left maxillary sinus. Fracture through the left zygomatic arch. Mandible intact. Orbits: Depressed fracture through the floor of the left orbit with herniation of orbital fat noted. No evidence of muscular entrapment. Fractures through the lateral wall and medial wall of the left orbit. Sinuses: Soft tissue swelling over the left orbit and face. Soft tissues: None Limited  intracranial: No significant or unexpected finding. IMPRESSION: Fractures through the medial, lateral walls and floor the left orbit. No evidence of muscular entrapment. Fracture through the anterior and posterior wall of the left maxillary sinus. Electronically Signed   By: Rolm Baptise M.D.   On: 10/16/2020 12:46    Anti-infectives: Anti-infectives (From admission, onward)   Start     Dose/Rate Route Frequency Ordered Stop   10/16/20 0945  ceFAZolin (ANCEF) IVPB 2g/100 mL premix        2 g 200 mL/hr over 30 Minutes Intravenous  Once 10/16/20 0931 10/16/20 1041       Assessment/Plan Found down SAH - repeat CTH this AM stable, NS signing off, TBI therapies to see, keppra x1 week Possible MCA thrombus - noted on CT head, CTA negative, brain perfusion scan negative C2 fracture - collar present, NS recommends collar and OP follow up  L tetrapod facial fracture - ENT recommends non-op management, ice, liquid/soft diet as tolerated HTN - reordered home meds  GERD Hx of lung mass s/p resection, Hx of renal mass CAD Anxiety - ordered home psych meds  Dementia  FEN: ok to try soft diet, SLP to see as well, IVF VTE: SCDs, no chemical VTE in setting of acute TBI ID: Ancef x1  Dispo: transfer to 4NP. TBI therapies to see. Limit sedating meds. Palliative consult to establish GOC as well. Repeat labs this AM.   LOS: 1 day    Norm Parcel , [facility] Surgery 10/17/2020, 9:34 AM Please see Amion for pager number during day hours 7:00am-4:30pm

## 2020-10-17 NOTE — Progress Notes (Signed)
Occupational Therapy Evaluation   Pt admitted with the below listed diagnosis and demonstrates the below listed deficits.  She currently demonstrates deficits with balance, cognition, generalized weakness, and decreased activity tolerance.  She requires mod - total A for ADLs and min A +2 for functional mobility.  Per chart review, and pt report, it appears pt lives with her spouse and was mod I with ADLs and functional mobility, however, no family present to confirm this info.  Feel she would benefit from CIR level rehab to maximize her safety and independence with ADLs.  Will follow.    10/17/20 1000  OT Visit Information  Last OT Received On 10/17/20  Assistance Needed +2 (safety)  PT/OT/SLP Co-Evaluation/Treatment Yes  Reason for Co-Treatment For patient/therapist safety;To address functional/ADL transfers  OT goals addressed during session ADL's and self-care  History of Present Illness This 75 y.o. female admitted after being found on floor by her spouse.  She was found ot have traumatic SAH, and punctate ICH; C2 fracture, and facial fx.  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  PMH includes: new diagnosis metastatic renal cancer, h/o lung CA, Anxiety, osteopenia, cognitive dysfunction with behavioral changes (See notes in chart review 05/22/2020)  Precautions  Precautions Fall;Cervical  Precaution Booklet Issued No  Precaution Comments reviewed cervical precautions with pt  Required Braces or Orthoses Cervical Brace  Cervical Brace Hard collar;At all times  Home Living  Family/patient expects to be discharged to: Private residence  Living Arrangements Spouse/significant other  Additional Comments Per pt report and per chart review, pt lives with spouse, and it appears son assists at home  Prior Function  Level of Independence Independent  Comments Per chart review, and pt report, it appears as if pt may have been performing ADLs and functional mobility mod I.   Family assists with medication management, and per chart review, pt no longer drives.  No family present to provide info.  Communication  Communication Receptive difficulties;Expressive difficulties  Pain Assessment  Pain Assessment Faces  Faces Pain Scale 4  Pain Location generalized grimmacing  Pain Descriptors / Indicators Grimacing  Pain Intervention(s) Monitored during session  Cognition  Arousal/Alertness Awake/alert;Lethargic  Behavior During Therapy Restless;Flat affect  Overall Cognitive Status Impaired/Different from baseline  Area of Impairment Orientation;Attention;Memory;Following commands;Safety/judgement;Awareness;Problem solving;Rancho level  Orientation Level Disoriented to;Place  Current Attention Level Focused;Sustained  Memory Decreased short-term memory;Decreased recall of precautions  Following Commands Follows one step commands inconsistently;Follows one step commands with increased time  Safety/Judgement Decreased awareness of deficits;Decreased awareness of safety  Problem Solving Slow processing;Decreased initiation;Difficulty sequencing;Requires verbal cues;Requires tactile cues  General Comments Pt is oriented to person, and to [facility].  She demosntrates delayed responeses up to ~3 -4 mins.  She was able to state her DOB, but the year was incorrect.  She was noted to perseverate.  She required max cues and mod A to sequence through simple ADL tasks such as toileting and washing her hands. She repeatedly attempted to remove cervical collar  Rancho Levels of Cognitive Functioning  Rancho Los Amigos Scales of Cognitive Functioning V  Upper Extremity Assessment  Upper Extremity Assessment Generalized weakness (grossly assessed)  Lower Extremity Assessment  Lower Extremity Assessment Defer to PT evaluation  Cervical / Trunk Assessment  Cervical / Trunk Assessment Other exceptions  Cervical / Trunk Exceptions s/p C2 fx with cervical collar  ADL   Overall ADL's  Needs assistance/impaired  Eating/Feeding Minimal assistance;Bed level  Grooming Wash/dry hands;Wash/dry face;Moderate  assistance;Standing  Grooming Details (indicate cue type and reason) Pt initially turned the water on, then off and walked away when asked to wash her hands.  She required hand over hand assist to sequence the task appropriately.  Upper Body Bathing Maximal assistance;Sitting  Lower Body Bathing Maximal assistance;Sit to/from stand  Upper Body Dressing  Total assistance;Sitting  Lower Body Dressing Total assistance;Sit to/from Retail buyer Minimal assistance;+2 for physical assistance;+2 for safety/equipment;Ambulation;Comfort height toilet;Grab bars  Toileting- Clothing Manipulation and Hygiene Moderate assistance;Sit to/from stand  Toileting - Clothing Manipulation Details (indicate cue type and reason) Pt spontaneously retrieved toilet paper, then sat on commode and proceeded to wipe her peri area, then stand from toilet without urinating or having a bowel movement.  Functional mobility during ADLs Minimal assistance;+2 for physical assistance;+2 for safety/equipment  Vision- Assessment  Additional Comments Lt eye edematous with minimal eye opening.  Difficult to accurately assess due to cognitive impairment  Perception  Comments difficult to accurately assess  Praxis  Praxis tested? Deficits  Deficits Perseveration;Organization  Bed Mobility  Overal bed mobility Needs Assistance  Bed Mobility Supine to Sit;Sit to Supine  Supine to sit Mod assist;HOB elevated  Sit to supine Min assist;+2 for safety/equipment  General bed mobility comments assist to initiate task and to move LEs off the bed and to lift trunk  Transfers  Overall transfer level Needs assistance  Equipment used 2 person hand held assist  Transfers Sit to/from [employer] Transfers  Sit to Stand Min assist;+2 physical assistance;+2 safety/equipment  Stand pivot transfers  Min assist;+2 physical assistance;+2 safety/equipment  General transfer comment assist to initiate task, and assist for balance  Balance  Overall balance assessment Needs assistance  Sitting-balance support Bilateral upper extremity supported;Single extremity supported;No upper extremity supported  Sitting balance-Leahy Scale Fair  Sitting balance - Comments able to maintain static sitting with min guard assist for safety  Standing balance support Bilateral upper extremity supported;Single extremity supported;During functional activity  Standing balance-Leahy Scale Poor  Standing balance comment requires UE support and min A  General Comments  General comments (skin integrity, edema, etc.) VSS.  Cervical collar repositioned  OT - End of Session  Equipment Utilized During Treatment Cervical collar;Gait belt  Activity Tolerance Patient tolerated treatment well  Patient left in bed;with call bell/phone within reach;with bed alarm set;with restraints reapplied  Nurse Communication Mobility status  OT Assessment  OT Recommendation/Assessment Patient needs continued OT Services  OT Visit Diagnosis Unsteadiness on feet (R26.81);Cognitive communication deficit (R41.841)  OT Problem List Decreased strength;Decreased activity tolerance;Impaired balance (sitting and/or standing);Impaired vision/perception;Decreased cognition;Decreased knowledge of use of DME or AE;Decreased safety awareness  OT Plan  OT Frequency (ACUTE ONLY) Min 2X/week  OT Treatment/Interventions (ACUTE ONLY) Self-care/ADL training;DME and/or AE instruction;Therapeutic activities;Cognitive remediation/compensation;Visual/perceptual remediation/compensation;Patient/family education;Balance training  AM-PAC OT "6 Clicks" Daily Activity Outcome Measure (Version 2)  Help from another person eating meals? 3  Help from another person taking care of personal grooming? 2  Help from another person toileting, which includes using toliet,  bedpan, or urinal? 2  Help from another person bathing (including washing, rinsing, drying)? 2  Help from another person to put on and taking off regular upper body clothing? 1  Help from another person to put on and taking off regular lower body clothing? 1  6 Click Score 11  OT Recommendation  Recommendations for Other Services Rehab consult  Follow Up Recommendations CIR  OT Equipment None recommended by OT  Individuals Consulted  Consulted and Agree  with Results and Recommendations Patient  Acute Rehab OT Goals  Patient Stated Goal pt unable to state  OT Goal Formulation With patient  Time For Goal Achievement 10/31/20  Potential to Achieve Goals Good  OT Time Calculation  OT Start Time (ACUTE ONLY) 0941  OT Stop Time (ACUTE ONLY) 1008  OT Time Calculation (min) 27 min  OT General Charges  $OT Visit 1 Visit  OT Evaluation  $OT Eval Moderate Complexity 1 Mod  Written Expression  Dominant Hand Right  Nilsa Nutting., OTR/L Acute Rehabilitation Services Pager [phone] Office [phone]

## 2020-10-17 NOTE — Progress Notes (Signed)
Spoke with DR Kieth Brightly, discussed pt condition, pt still kept on moving on the bed, last haldol was given at 2000 H but with no effect, pt is scheduled t have repeat Brain CT, and said will order ativan before the procedure

## 2020-10-17 NOTE — Progress Notes (Signed)
Nutrition Follow-up  DOCUMENTATION CODES:   Severe malnutrition in context of chronic illness,Underweight  INTERVENTION:   Ensure Enlive po BID, each supplement provides 350 kcal and 20 grams of protein  Magic cup TID with meals, each supplement provides 290 kcal and 9 grams of protein  Encourage PO intake    NUTRITION DIAGNOSIS:   Severe Malnutrition related to chronic illness as evidenced by severe muscle depletion,severe fat depletion.  GOAL:   Patient will meet greater than or equal to 90% of their needs  MONITOR:   PO intake,Supplement acceptance  REASON FOR ASSESSMENT:   Rounds    ASSESSMENT:   Pt with PMH of current smoker, LUL lung mass s/p L wedge resection 2019, L renal mass, GERD, HTN, and osteopenia who was found down at home by husband admitted with SAH, C2 fx, orbital fx with significant orbital content displacement.    Pt discussed during ICU rounds and with RN.  Per RN pt needs to be fed to eat. Psych meds resumed. Pt in mittens at time of visit. Per RN pt has been agitated.  ENT following for injuries. Noted palliative care consulted and plans for SNF placement at discharge.   Medications reviewed and include: colace, remeron keppra  Labs reviewed: K+3.3   UOP: 450 ml   Diet Order:   Diet Order            DIET DYS 3 Room service appropriate? Yes; Fluid consistency: Thin  Diet effective now                 EDUCATION NEEDS:   Not appropriate for education at this time  Skin:  Skin Assessment: Reviewed RN Assessment  Last BM:  unknown  Height:   Ht Readings from Last 1 Encounters:  10/16/20 4\' 11"  (1.499 m)    Weight:   Wt Readings from Last 1 Encounters:  10/16/20 32.3 kg    Ideal Body Weight:  44.6 kg  BMI:  Body mass index is 14.38 kg/m.  Estimated Nutritional Needs:   Kcal:  1200-1400  Protein:  55-65 grams  Fluid:  >1.5 L/day  Lockie Pares., RD, LDN, CNSC See AMiON for contact information

## 2020-10-17 NOTE — Progress Notes (Addendum)
  NEUROSURGERY PROGRESS NOTE   No issues overnight. No voiced concerns this am  EXAM:  BP (!) 145/48 (BP Location: Right Arm)   Pulse 70   Temp 98.1 F (36.7 C) (Oral)   Resp 17   Ht 4\' 11"  (1.499 m)   Wt 32.3 kg   SpO2 98%   BMI 14.38 kg/m   Awake, alert, oriented to self but not year, location Speech slow Left periorbital edema and eccymosis, CN otherwise grossly intact  MAEW, seemingly nonfocal exam   IMPRESSION/PLAN 75 y.o. female s/p presumed fall with small amount of tSAH, punctate IPH, C2 fracture and facial fx. Confused, but otherwise grossly neuro intact. Repeat head CT shows stable punctate IPH with now new small IVH. Remains no role for NS intervention.  - No need for repeat imaging unless exam changes - complete 7 day course of keppra for routine seizure prophylaxis - continue aspen c collar at all times  Will sign off. Please call for any concerns. Would like to see patient 2 weeks after discharge for monitoring.   75 y.o. woman s/p trauma, unknown mechanism with multiple small punctate hemorrhages without associated mass effect, non-displaced fracture of antero-inferior body of C2, and multiple facial fractures. She is confused but otherwise non-focal c/w mild-mod TBI.   - Can manage C2 fracture conservatively, Aspen collar at all times - Can f/u in outpatient clinic in 2 weeks for ICH and C2 fracture - Complete 7d Keppra  Consuella Lose, MD [facility]

## 2020-10-17 NOTE — Progress Notes (Signed)
Inpatient Rehab Admissions Coordinator Note:   Per therapy recommendations, pt was screened for CIR candidacy by Shann Medal, PT, DPT.  At this time we are recommending a CIR consult, and I will place an order per our protocol.  Please contact me with questions.   Shann Medal, PT, DPT [phone] 10/17/20 3:47 PM

## 2020-10-17 NOTE — Progress Notes (Signed)
Patient transferred to [place] PCU room 12. Report given to Vickki Hearing, RN.

## 2020-10-17 NOTE — Progress Notes (Signed)
CT scan brain done

## 2020-10-17 NOTE — Evaluation (Signed)
Physical Therapy Evaluation Patient Details Name: Leslie Boyd MRN: [id number] DOB: 27-Apr-1946 Today's Date: 10/17/2020   History of Present Illness  This 75 y.o. female presented 4/12 after being found on floor by her spouse.  She was found to have traumatic SAH, and punctate ICH; C2 fracture, and facial fx.  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  PMH includes: new diagnosis metastatic renal cancer, h/o lung CA, Anxiety, osteopenia, cognitive dysfunction with behavioral changes (See notes in chart review 05/22/2020)    Clinical Impression  Pt presents with condition above and deficits mentioned below, see PT Problem List. PTA, she was living on the 2nd floor of her home with her husband living on the first floor. Pt was independent without utilizing an AD for functional mobility, but has recently been getting weaker as she has been staying in bed and appears more depressed recently due to new health diagnoses, per husband. She currently demonstrates deficits with balance, impaired cognition, generalized weakness, incoordination, and decreased activity tolerance that place her at risk for falls. She requires min A +2 for functional mobility with bil UE support this date. Pt would greatly benefit from CIR level rehab to maximize her safety and independence with all functional mobility. Will continue to follow acutely.     Follow Up Recommendations CIR;Supervision/Assistance - 24 hour    Equipment Recommendations  3in1 (PT);Other (comment) (RW if cannot manage the already owned rollator)    Recommendations for Other Services Rehab consult     Precautions / Restrictions Precautions Precautions: Fall;Cervical Precaution Booklet Issued: No Precaution Comments: reviewed cervical precautions with pt; bil mittens Required Braces or Orthoses: Cervical Brace Cervical Brace: Hard collar;At all times Restrictions Weight Bearing Restrictions: No      Mobility  Bed  Mobility Overal bed mobility: Needs Assistance Bed Mobility: Supine to Sit;Sit to Supine     Supine to sit: Mod assist;HOB elevated Sit to supine: Min assist;+2 for safety/equipment   General bed mobility comments: assist to initiate task and to move legs off/on the bed and to lift trunk    Transfers Overall transfer level: Needs assistance Equipment used: 2 person hand held assist Transfers: Sit to/from [facility] Sit to Stand: Min assist;+2 physical assistance;+2 safety/equipment Stand pivot transfers: Min assist;+2 physical assistance;+2 safety/equipment       General transfer comment: assist to initiate task, and assist for balance with bil HHA. Pt needs physical guidance to sequence tasks.  Ambulation/Gait Ambulation/Gait assistance: Min assist;+2 physical assistance;+2 safety/equipment Gait Distance (Feet): 300 Feet Assistive device: 2 person hand held assist Gait Pattern/deviations: Step-through pattern;Decreased step length - right;Decreased step length - left;Decreased stride length;Shuffle;Trunk flexed Gait velocity: reduced Gait velocity interpretation: <1.31 ft/sec, indicative of household ambulator General Gait Details: Pt with flexed posture, ambulating at decreased speed with shuffling steps, needing bil HHA minAx2 for steadying and physical guidance.  Stairs            Wheelchair Mobility    Modified Rankin (Stroke Patients Only)       Balance Overall balance assessment: Needs assistance Sitting-balance support: Bilateral upper extremity supported;Single extremity supported;No upper extremity supported Sitting balance-Leahy Scale: Fair Sitting balance - Comments: able to maintain static sitting with min guard assist for safety   Standing balance support: Bilateral upper extremity supported;Single extremity supported;During functional activity Standing balance-Leahy Scale: Poor Standing balance comment: requires UE support and min  A  Pertinent Vitals/Pain Pain Assessment: Faces Faces Pain Scale: Hurts little more Pain Location: generalized grimacing Pain Descriptors / Indicators: Grimacing Pain Intervention(s): Limited activity within patient's tolerance;Monitored during session;Repositioned    Home Living Family/patient expects to be discharged to:: Private residence Living Arrangements: Spouse/significant other Available Help at Discharge: Family;Available 24 hours/day Type of Home: House Home Access: Stairs to enter Entrance Stairs-Rails: Can reach both Entrance Stairs-Number of Steps: 2 Home Layout: Two level;Bed/bath upstairs Home Equipment: Walker - 4 wheels;Wheelchair - manual Additional Comments: Pt's husband reports their son works from home x2 days/week and can assist in evenings after work. They report recently considering trying to get the pt and her husband into an ALF. Pt can be resistive to leaving home though or transitioning to the floor level of the house, per spouse.    Prior Function Level of Independence: Independent         Comments: Per family and pt report, pt was independent without an AD with all functional mobility, but has recently been staying in bed with apparent depression and getting weaker and having more difficulty with tasks, like bathing self. Family assists with medication management, per chart review. Spouse confirmed that pt no longer drives. Spouse reports that pt has a tendency to cancel/change MD appointments if she does not like the answers provided and fails to relay this info to her husband who sets up the appointments.     Hand Dominance   Dominant Hand: Right    Extremity/Trunk Assessment   Upper Extremity Assessment Upper Extremity Assessment: Defer to OT evaluation (pt undershooting at times)    Lower Extremity Assessment Lower Extremity Assessment: Generalized weakness (assessed via functional mobility)     Cervical / Trunk Assessment Cervical / Trunk Assessment: Other exceptions Cervical / Trunk Exceptions: s/p C2 fx with cervical collar  Communication   Communication: Receptive difficulties;Expressive difficulties  Cognition Arousal/Alertness: Awake/alert;Lethargic Behavior During Therapy: Restless;Flat affect Overall Cognitive Status: Impaired/Different from baseline Area of Impairment: Orientation;Attention;Memory;Following commands;Safety/judgement;Awareness;Problem solving;Rancho level               Rancho Levels of Cognitive Functioning Rancho Los Amigos Scales of Cognitive Functioning: Confused/inappropriate/non-agitated Orientation Level: Disoriented to;Place Current Attention Level: Focused;Sustained Memory: Decreased short-term memory;Decreased recall of precautions Following Commands: Follows one step commands inconsistently;Follows one step commands with increased time Safety/Judgement: Decreased awareness of deficits;Decreased awareness of safety   Problem Solving: Slow processing;Decreased initiation;Difficulty sequencing;Requires verbal cues;Requires tactile cues General Comments: Pt is oriented to person, and to [facility].  She demonstrates delayed responeses up to ~3 -4 mins.  She was able to state her DOB, but the year was incorrect.  She was noted to perseverate.  She required max cues and mod A to sequence through simple ADL tasks such as toileting and washing her hands. She repeatedly attempted to remove cervical collar. Pt with increased alertness and conversation when upright compared to when in bed. Pt with poor awareness into her deficits or body, sitting and performing pericare on toilet without even attempting to urinate while on toilet.      General Comments General comments (skin integrity, edema, etc.): VSS.  Cervical collar repositioned    Exercises     Assessment/Plan    PT Assessment Patient needs continued PT services  PT Problem List  Decreased strength;Decreased activity tolerance;Decreased balance;Decreased range of motion;Decreased mobility;Decreased coordination;Decreased cognition;Decreased knowledge of use of DME;Decreased safety awareness;Decreased knowledge of precautions       PT Treatment Interventions DME instruction;Gait training;Stair training;Functional mobility training;Therapeutic  activities;Therapeutic exercise;Balance training;Cognitive remediation;Neuromuscular re-education;Patient/family education    PT Goals (Current goals can be found in the Care Plan section)  Acute Rehab PT Goals Patient Stated Goal: husband reports a desire for pt to improve and ultimately for him and her to go to an ALF PT Goal Formulation: With patient/family Time For Goal Achievement: 10/31/20 Potential to Achieve Goals: Fair    Frequency Min 3X/week   Barriers to discharge        Co-evaluation PT/OT/SLP Co-Evaluation/Treatment: Yes Reason for Co-Treatment: Necessary to address cognition/behavior during functional activity;For patient/therapist safety;To address functional/ADL transfers PT goals addressed during session: Mobility/safety with mobility;Balance         AM-PAC PT "6 Clicks" Mobility  Outcome Measure Help needed turning from your back to your side while in a flat bed without using bedrails?: A Little Help needed moving from lying on your back to sitting on the side of a flat bed without using bedrails?: A Lot Help needed moving to and from a bed to a chair (including a wheelchair)?: A Lot Help needed standing up from a chair using your arms (e.g., wheelchair or bedside chair)?: A Lot Help needed to walk in hospital room?: A Lot Help needed climbing 3-5 steps with a railing? : Total 6 Click Score: 12    End of Session Equipment Utilized During Treatment: Gait belt;Cervical collar Activity Tolerance: Patient tolerated treatment well Patient left: in bed;with call bell/phone within reach;with bed alarm  set;with restraints reapplied Nurse Communication: Mobility status PT Visit Diagnosis: Unsteadiness on feet (R26.81);Other abnormalities of gait and mobility (R26.89);Muscle weakness (generalized) (M62.81);History of falling (Z91.81);Difficulty in walking, not elsewhere classified (R26.2)    Time: 2423-5361 PT Time Calculation (min) (ACUTE ONLY): 31 min   Charges:   PT Evaluation $PT Eval Moderate Complexity: 1 Mod          Moishe Spice, PT, DPT Acute Rehabilitation Services  Pager: [phone] Office: [phone]   Orvan Falconer 10/17/2020, 3:07 PM

## 2020-10-17 NOTE — Progress Notes (Signed)
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 12:46     PHYSICAL EXAM: Periorbital ecchymosis and mild edema.  No significant swelling. No palpable fracture in the periorbital region.  Patient reports normal vision and has normal extraocular mobility without evidence of entrapment or diplopia No epistaxis or bleeding.   Assessment/Plan: Patient stable after left trimalar and orbital floor fracture.  She has a significant medial defect but does not appear to have any entrapment or other issues, given her underlying health and medical issues I would not recommend surgical intervention at this time.  Plan follow-up as an outpatient in approximately 1 week for recheck, if stable she would not require surgical intervention.  Recommend fracture precautions with particular attention to saline nasal spray and avoiding further trauma.  She will avoid nose blowing.  Recommend p.o. antibiotics (Keflex or Augmentin) x10 days.    Jerrell Belfast 10/17/2020, 12:02 PM

## 2020-10-17 NOTE — TOC Initial Note (Signed)
Transition of Care [facility]) - Initial/Assessment Note    Patient Details  Name: Leslie Boyd MRN: [id number] Date of Birth: [birth year]  Transition of Care [facility]) CM/SW Contact:    Ella Bodo, RN Phone Number: 10/17/2020, 5:06 PM  Clinical Narrative:   This 75 y.o. female presented 4/12 after being found on floor by her spouse.  She was found to have traumatic SAH, and punctate ICH; C2 fracture, and facial fx.  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  Spoke with patient's daughter, Chrys Racer, to discuss discharge planning.  PT/OT recommending CIR at this time.  Daughter states that patient currently lives with her 12 year old husband, who is limited due to his own health problems.  She states that she and her brother are unable to provide 24-hour support at discharge, and that patient would be better suited for skilled nursing facility for rehab.  She states that patient has required increased assistance as of late, and intentionally overdosed last year as a suicide attempt.  She was hospitalized at [facility] inpatient psych in [place].  Daughter agreeable to patient being faxed out for SNF bed search; will initiate FL- 2 and begin bed search.  Will follow up with daughter when bed offers available.            Expected Discharge Plan: Skilled Nursing Facility Barriers to Discharge: Continued Medical Work up   Patient Goals and CMS Choice   CMS Medicare.gov Compare Post Acute Care list provided to:: Patient Represenative (must comment) (daughter, Chrys Racer) Choice offered to / list presented to : Adult [place] / Guardian (daughter)  Expected Discharge Plan and Services Expected Discharge Plan: [place]   Discharge Planning Services: CM Consult   Living arrangements for the past 2 months: Single Family Home                                      Prior Living Arrangements/Services Living arrangements for the past 2 months: Single  Family Home Lives with:: Spouse Patient language and need for interpreter reviewed:: Yes        Need for Family Participation in Patient Care: Yes (Comment) Care giver support system in place?: No (comment)   Criminal Activity/Legal Involvement Pertinent to Current Situation/Hospitalization: No - Comment as needed  Activities of Daily Living Home Assistive Devices/Equipment: None ADL Screening (condition at time of admission) Patient's cognitive ability adequate to safely complete daily activities?: No Is the patient deaf or have difficulty hearing?: No Does the patient have difficulty seeing, even when wearing glasses/contacts?: No Does the patient have difficulty concentrating, remembering, or making decisions?: No Patient able to express need for assistance with ADLs?: Yes Does the patient have difficulty dressing or bathing?: Yes Independently performs ADLs?: No Does the patient have difficulty walking or climbing stairs?: Yes Weakness of Legs: Both Weakness of Arms/Hands: Both  Permission Sought/Granted                  Emotional Assessment Appearance:: Appears stated age   Affect (typically observed): Appropriate        Admission diagnosis:  ICH (intracerebral hemorrhage) ([place]) [I61.9] Fall [W19.XXXA] Patient Active Problem List   Diagnosis Date Noted  . Protein-calorie malnutrition, severe 10/17/2020  . ICH (intracerebral hemorrhage) ([place]) 10/16/2020  . Renal mass, left 08/30/2020  . Dementia with behavioral disturbance ([place]) 12/08/2019  . GAD (generalized anxiety disorder) 12/08/2019  .  Gastroesophageal reflux disease without esophagitis 12/08/2019  . Intentional drug overdose ([place]) 12/08/2019  . Cognitive impairment 11/16/2019  . Dermatochalasis of both upper eyelids 04/26/2019  . Keratoconjunctivitis sicca of both eyes not specified as Sjogren's 04/26/2019  . Adenocarcinoma of left lung, stage 1 ([place]) 05/07/2018  . Aortic atherosclerosis ([place])  03/15/2018  . Substance abuse ([place])   . 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 Vitamin D deficiency 07/21/2017  . Coronary artery calcification seen on CAT scan 07/15/2017  . Episodic tobacco dependence 09/01/2016  . Essential hypertension 09/01/2016  . Nodule of left lung 09/01/2016  . Hypercholesteremia 07/18/2016  . Indigestion 03/17/2016  . Mixed emotional features as adjustment reaction 03/17/2016  . Osteopenia 03/17/2016   PCP:  Nicola Girt, DO Pharmacy:   [phone] [place] [place] [address] Phone: [phone] Fax: [phone]     Social Determinants of Health (SDOH) Interventions    Readmission Risk Interventions No flowsheet data found.  Reinaldo Raddle, RN, BSN  Trauma/Neuro ICU Case Manager [phone]

## 2020-10-18 DIAGNOSIS — W19XXXA Unspecified fall, initial encounter: Secondary | ICD-10-CM

## 2020-10-18 DIAGNOSIS — I619 Nontraumatic intracerebral hemorrhage, unspecified: Secondary | ICD-10-CM | POA: Diagnosis not present

## 2020-10-18 DIAGNOSIS — Z515 Encounter for palliative care: Secondary | ICD-10-CM

## 2020-10-18 DIAGNOSIS — Z7189 Other specified counseling: Secondary | ICD-10-CM

## 2020-10-18 DIAGNOSIS — F17210 Nicotine dependence, cigarettes, uncomplicated: Secondary | ICD-10-CM

## 2020-10-18 DIAGNOSIS — R627 Adult failure to thrive: Secondary | ICD-10-CM

## 2020-10-18 DIAGNOSIS — C641 Malignant neoplasm of right kidney, except renal pelvis: Secondary | ICD-10-CM

## 2020-10-18 DIAGNOSIS — S065X0A Traumatic subdural hemorrhage without loss of consciousness, initial encounter: Secondary | ICD-10-CM

## 2020-10-18 DIAGNOSIS — F0391 Unspecified dementia with behavioral disturbance: Secondary | ICD-10-CM

## 2020-10-18 DIAGNOSIS — R4182 Altered mental status, unspecified: Secondary | ICD-10-CM

## 2020-10-18 DIAGNOSIS — C7951 Secondary malignant neoplasm of bone: Secondary | ICD-10-CM

## 2020-10-18 MED ORDER — ACETAMINOPHEN 500 MG PO TABS
1000.0000 mg | ORAL_TABLET | Freq: Four times a day (QID) | ORAL | Status: DC
  Administered 2020-10-18 – 2020-10-25 (×18): 1000 mg via ORAL
  Filled 2020-10-18 (×24): qty 2

## 2020-10-18 MED ORDER — LEVETIRACETAM 500 MG PO TABS
500.0000 mg | ORAL_TABLET | Freq: Two times a day (BID) | ORAL | Status: DC
  Administered 2020-10-18 (×2): 500 mg via ORAL
  Filled 2020-10-18 (×2): qty 1

## 2020-10-18 MED ORDER — METHOCARBAMOL 500 MG PO TABS
1000.0000 mg | ORAL_TABLET | Freq: Three times a day (TID) | ORAL | Status: DC
  Administered 2020-10-18 – 2020-10-25 (×17): 1000 mg via ORAL
  Filled 2020-10-18 (×19): qty 2

## 2020-10-18 MED ORDER — NICOTINE 14 MG/24HR TD PT24
14.0000 mg | MEDICATED_PATCH | Freq: Every day | TRANSDERMAL | Status: DC
  Administered 2020-10-18 – 2020-10-25 (×7): 14 mg via TRANSDERMAL
  Filled 2020-10-18 (×8): qty 1

## 2020-10-18 MED ORDER — SALINE SPRAY 0.65 % NA SOLN
1.0000 | NASAL | Status: DC | PRN
  Filled 2020-10-18: qty 44

## 2020-10-18 MED ORDER — AMOXICILLIN-POT CLAVULANATE 500-125 MG PO TABS
1.0000 | ORAL_TABLET | Freq: Two times a day (BID) | ORAL | Status: DC
  Administered 2020-10-18 – 2020-10-24 (×12): 500 mg via ORAL
  Filled 2020-10-18 (×15): qty 1

## 2020-10-18 MED ORDER — METOPROLOL TARTRATE 5 MG/5ML IV SOLN
5.0000 mg | Freq: Four times a day (QID) | INTRAVENOUS | Status: DC | PRN
  Administered 2020-10-20 – 2020-10-23 (×3): 5 mg via INTRAVENOUS
  Filled 2020-10-18 (×3): qty 5

## 2020-10-18 NOTE — Consult Note (Signed)
Consultation Note Date: 10/18/2020   Patient Name: Leslie Boyd  DOB: 12/21/1945  MRN: [id number]  Age / Sex: 75 y.o., female  PCP: Nicola Girt, DO Referring Physician: Md, Trauma, MD  Reason for Consultation: Establishing goals of care and Psychosocial/spiritual support  HPI/Patient Profile: 75 y.o. female   admitted on 10/16/2020 with  after being found down, presenting to [facility] with SAH, C2 fx, facial fx, and concern for M2/3 thrombus. Patient emergently transported as L1A for possible neuro IR intervention. Discussed case with both NSGY (Dr. Zada Finders) and Neuro IR (Dr. Estanislado Pandy) and plan for CT perfusion scan at presentation. Patient presented confused, but moves all extremities symmetrically, and f/c. CTA obtained prior to transport negative for thrombus.CT perfusion scan negative. Currently in  ICU for close neuro monitoring.  Today is day 2 of this hospitalization, she remains lethargic responds to her name but does not open her eyes, or follow commands.  Poor po intake.  Palliative medicine consult for goals of care.    Clinical Assessment and Goals of Care:   This NP Wadie Lessen reviewed medical records, received report from team, assessed the patient and then spoke from the patient's bedside by telephone with her son Henya Aguallo, and daughter/ HPOA Chrys Racer Rodden to discuss diagnosis, prognosis, GOC, EOL wishes disposition and options.   Concept of Palliative Care was introduced as specialized medical care for people and their families living with serious illness.  If focuses on providing relief from the symptoms and stress of a serious illness.  The goal is to improve quality of life for both the patient and the family.  Values and goals of care important to patient and family were attempted to be elicited.  Created space and opportunity for family  to explore thoughts and feelings  regarding current medical situation.  Her children report a  difficult past year.  Patient has had continued cognitive decline secondary to depression and possibly underlying dementia.  She has declined physically and functionally.  She is paranoid at times.  There was a suicide attempt/drug overdose approximately 1 year ago.  She spent 3weeks at a facility in [place] and seemed to be doing well, plan was for her to transition to assisted living but instead she returned home and  unfortunately she quickly declined.    Son feels that "she has lost her will to live".   Patient currently  live at home with her husband. He is elderly himself.   Patient's daughter Chrys Racer is her HPOA.     A  discussion was had today regarding advanced directives.  Concepts specific to code status, artifical feeding and hydration, continued IV antibiotics and rehospitalization was had.    The difference between a aggressive medical intervention path  and a palliative comfort care path for this patient at this time was had.      Questions and concerns addressed.  Patient  encouraged to call with questions or concerns.     PMT will continue to support holistically.    HCPOA/Caroline  SUMMARY OF RECOMMENDATIONS    Code Status/Advance Care Planning:  Full code  Encouraged patient/family to consider DNR/DNI status understanding evidenced based poor outcomes in similar hospitalized patient, as the cause of arrest is likely associated with advanced chronic illness rather than an easily reversible acute cardio-pulmonary event. HPOA/Caroline understands the important decisions that they are facing.  She wishes to discuss the document with both her brother and her father before making definitive decisions regarding plan of care.   She will bring documents in for scanning..  Family is open to ongoing discussions with treatment team and the palliative medicine team as they navigate patient's current medical situation  and transitions of care. This nurse practitioner informed  the family  that I will be out of the hospital until Monday morning.  If the patient is still hospitalized I will follow-up at that time.  Call palliative medicine team phone # [phone] with questions or concerns in the interim.  Symptom management:  Added NicoDerm patch 14 mg-patient is a current heavy smoker and hopefully this will help with agitation.  Calorie count placed to assess accurate intake.  Palliative Prophylaxis:   Aspiration, Bowel Regimen, Delirium Protocol, Frequent Pain Assessment and Oral Care  Additional Recommendations (Limitations, Scope, Preferences):  Full Scope Treatment  Psycho-social/Spiritual:   Desire for further Chaplaincy support:no  Additional Recommendations: Education on Hospice  Prognosis:   Unable to determine  Discharge Planning: To Be Determined      Primary Diagnoses: Present on Admission: . ICH (intracerebral hemorrhage) ([place])   I have reviewed the medical record, interviewed the patient and family, and examined the patient. The following aspects are pertinent.  Past Medical History:  Diagnosis Date  . Anxiety   . Arthritis   . Cancer ([place])   . Coronary artery calcification seen on CT scan    cardiologist-- dr Atilano Median [facility] in [facility])  . GERD (gastroesophageal reflux disease)   . History of kidney stones    ureter stent in place  . History of stress test    stress echo 01-29-2018 with dr Atilano Median, cardiologist with [facility] in [facility]  . Hypertension   . 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 Osteopenia   . Renal lesion    left renal pelvis  . Renal mass, left 08/30/2020   Social History   Socioeconomic History  . Marital status: Married    Spouse name: Not on file  . Number of children: Not on file  . Years of education: Not on file  . Highest education level: Not on file   Occupational History  . Not on file  Tobacco Use  . Smoking status: Current Every Day Smoker    Packs/day: 0.50    Years: 53.00    Pack years: 26.50    Types: Cigarettes  . Smokeless tobacco: Never Used  . Tobacco comment: smokes 4 cigarettes a day  Vaping Use  . Vaping Use: Never used  Substance and Sexual Activity  . Alcohol use: Not Currently    Comment: occ  . Drug use: No  . Sexual activity: Yes  Other Topics Concern  . Not on file  Social History Narrative  . Not on file   Social Determinants of Health   Financial Resource Strain: Not on file  Food Insecurity: Not on file  Transportation Needs: Not on file  Physical Activity: Not on file  Stress: Not  on file  Social Connections: Not on file   Family History  Problem Relation Age of Onset  . Heart disease Father   . COPD Sister   . Heart disease Brother   . Colon cancer Neg Hx    Scheduled Meds: . acetaminophen  1,000 mg Oral Q6H  . amLODipine  5 mg Oral Daily  . amoxicillin-clavulanate  1 tablet Oral Q12H  . atorvastatin  20 mg Oral Daily  . Chlorhexidine Gluconate Cloth  6 each Topical Daily  . docusate sodium  100 mg Oral BID  . feeding supplement  237 mL Oral BID BM  . levETIRAcetam  500 mg Oral BID  . methocarbamol  1,000 mg Oral Q8H  . mirtazapine  15 mg Oral QHS  . OLANZapine  7.5 mg Oral Daily  . sertraline  150 mg Oral Daily   Continuous Infusions: . sodium chloride 75 mL/hr at 10/17/20 1700   PRN Meds:.LORazepam, metoprolol tartrate, promethazine, sodium chloride Medications Prior to Admission:  Prior to Admission medications   Medication Sig Start Date End Date Taking? Authorizing Provider  acetaminophen (TYLENOL) 500 MG tablet Take 1,000 mg by mouth every 8 (eight) hours as needed for moderate pain.   Yes [provider]  amLODipine-atorvastatin (CADUET) 5-20 MG tablet Take 1 tablet by mouth daily.   Yes [provider]  cholecalciferol (VITAMIN D3) 25 MCG (1000 UNIT)  tablet Take 1,000 Units by mouth daily.   Yes [provider]  famotidine (PEPCID) 20 MG tablet Take 20 mg by mouth 2 (two) times daily. 02/27/19  Yes [provider]  HYDROcodone-acetaminophen (NORCO/VICODIN) 5-325 MG tablet Take 1 tablet by mouth every 6 (six) hours as needed for moderate pain. 10/05/20  Yes Ennever, Rudell Cobb, MD  LORazepam (ATIVAN) 0.5 MG tablet Take 0.5 mg by mouth daily. 06/22/18  Yes [provider]  mirtazapine (REMERON) 15 MG tablet Take 15 mg by mouth at bedtime. 11/03/19  Yes [provider]  OLANZapine (ZYPREXA) 7.5 MG tablet Take 7.5 mg by mouth daily. 12/16/19  Yes [provider]  promethazine (PHENERGAN) 12.5 MG tablet Take 2 tablets (25 mg total) by mouth every 8 (eight) hours as needed for nausea or vomiting. 09/27/20  Yes Hayden Rasmussen, MD  promethazine (PHENERGAN) 25 MG suppository Place 0.5 suppositories (12.5 mg total) rectally every 6 (six) hours as needed for nausea or vomiting. 09/29/20  Yes Tacy Learn, PA-C  sertraline (ZOLOFT) 100 MG tablet Take 150 mg by mouth daily. 12/16/19  Yes [provider]   Allergies  Allergen Reactions  . Ondansetron Hcl Other (See Comments)    Excessive sweating  . Morphine Nausea And Vomiting  . Morphine And Related Nausea And Vomiting and Rash   Review of Systems  Unable to perform ROS: Mental status change    Physical Exam Constitutional:      Appearance: She is cachectic. She is ill-appearing.  Neurological:     Mental Status: She is lethargic.     Vital Signs: BP (!) 176/96 (BP Location: Right Arm)   Pulse 89   Temp 98.7 F (37.1 C) (Oral)   Resp 18   Ht 4\' 11"  (1.499 m)   Wt 32.3 kg   SpO2 92%   BMI 14.38 kg/m  Pain Scale: 0-10   Pain Score: Asleep   SpO2: SpO2: 92 % O2 Device:SpO2: 92 % O2 Flow Rate: .   IO: Intake/output summary:   Intake/Output Summary (Last 24 hours) at 10/18/2020 0951 Last  data filed at 10/18/2020 0902 Gross per 24  hour  Intake 1542.03 ml  Output 355 ml  Net 1187.03 ml    LBM: Last BM Date:  (pta) Baseline Weight: Weight: 32.3 kg Most recent weight: Weight: 32.3 kg     Palliative Assessment/Data: 30%    Discussed with Margie Billet PA-C   Time In: 0915 Time Out: 1030 Time Total: 75 minutes Greater than 50%  of this time was spent counseling and coordinating care related to the above assessment and plan.  Signed by: Wadie Lessen, NP   Please contact Palliative Medicine Team phone at [phone] for questions and concerns.  For individual provider: See Shea Evans

## 2020-10-18 NOTE — NC FL2 (Signed)
[place] LEVEL OF CARE SCREENING TOOL     IDENTIFICATION  Patient Name: Leslie Boyd Birthdate: 07/12/1945 Sex: female Admission Date (Current Location): 10/16/2020  [facility] and [place] Number:  Herbalist and Address:  [facility], [place] [address] [place], [place] [address]      Provider Number: [id number]  Attending Physician Name and Address:  Md, Trauma, MD  Relative Name and Phone Number:  [name] [phone]    Current Level of Care: Hospital Recommended Level of Care: [place] Prior Approval Number:    Date Approved/Denied:   PASRR Number: Pending  Discharge Plan: SNF    Current Diagnoses: Patient Active Problem List   Diagnosis Date Noted  . Protein-calorie malnutrition, severe 10/17/2020  . ICH (intracerebral hemorrhage) ([place]) 10/16/2020  . Renal mass, left 08/30/2020  . Dementia with behavioral disturbance ([place]) 12/08/2019  . GAD (generalized anxiety disorder) 12/08/2019  . Gastroesophageal reflux disease without esophagitis 12/08/2019  . Intentional drug overdose ([place]) 12/08/2019  . Cognitive impairment 11/16/2019  . Dermatochalasis of both upper eyelids 04/26/2019  . Keratoconjunctivitis sicca of both eyes not specified as Sjogren's 04/26/2019  . Adenocarcinoma of left lung, stage 1 ([place]) 05/07/2018  . Aortic atherosclerosis ([place]) 03/15/2018  . Substance abuse ([place])   . 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 Vitamin D deficiency 07/21/2017  . Coronary artery calcification seen on CAT scan 07/15/2017  . Episodic tobacco dependence 09/01/2016  . Essential hypertension 09/01/2016  . Nodule of left lung 09/01/2016  . Hypercholesteremia 07/18/2016  . Indigestion 03/17/2016  . Mixed emotional features as adjustment reaction 03/17/2016  . Osteopenia 03/17/2016    Orientation RESPIRATION BLADDER Height & Weight     Self  Normal External catheter ([place])  Weight: 32.3 kg Height:  4\' 11"  (149.9 cm)  BEHAVIORAL SYMPTOMS/MOOD NEUROLOGICAL BOWEL NUTRITION STATUS      Incontinent  (Dysphagia 1, thin liquids, crush meds; full supervision/assist)  AMBULATORY STATUS COMMUNICATION OF NEEDS Skin   Limited Assist Verbally Skin abrasions,Bruising (Lt eye)                       Personal Care Assistance Level of Assistance  Bathing,Feeding,Dressing Bathing Assistance: Maximum assistance Feeding assistance: Maximum assistance Dressing Assistance: Maximum assistance     Functional Limitations Info  Sight (Lt eye swelling) Sight Info: Adequate        SPECIAL CARE FACTORS FREQUENCY  PT (By licensed PT),OT (By licensed OT),Speech therapy     PT Frequency: 5x weekly OT Frequency: 5x weekly     Speech Therapy Frequency: 5x weekly      Contractures Contractures Info: Not present    Additional Factors Info  Code Status,Allergies Code Status Info: Full Code Allergies Info: Ondansetron HCL-excessive sweating; Morphine-N/V; Morphine and related-N/V, rash           Current Medications (10/18/2020):  This is the current hospital active medication list Current Facility-Administered Medications  Medication Dose Route Frequency Provider Last Rate Last Admin  . 0.9 %  sodium chloride infusion   Intravenous Continuous Norm Parcel, PA-C 75 mL/hr at 10/17/20 1700 Infusion Verify at 10/17/20 1700  . acetaminophen (TYLENOL) tablet 1,000 mg  1,000 mg Oral Q6H Jesusita Oka, MD   1,000 mg at 10/18/20 1012  . amLODipine (NORVASC) tablet 5 mg  5 mg Oral Daily Norm Parcel, PA-C   5 mg at 10/18/20 1011  .  amoxicillin-clavulanate (AUGMENTIN) 500-125 MG per tablet 500 mg  1 tablet Oral Q12H Meuth, Brooke A, PA-C   500 mg at 10/18/20 1020  . atorvastatin (LIPITOR) tablet 20 mg  20 mg Oral Daily Norm Parcel, PA-C   20 mg at 10/18/20 1012  . Chlorhexidine Gluconate Cloth 2 % PADS 6 each  6 each Topical Daily Consuella Lose, MD   6 each at  10/18/20 1014  . docusate sodium (COLACE) capsule 100 mg  100 mg Oral BID Jesusita Oka, MD   100 mg at 10/18/20 1013  . feeding supplement (ENSURE ENLIVE / ENSURE PLUS) liquid 237 mL  237 mL Oral BID BM Georganna Skeans, MD   237 mL at 10/18/20 1348  . levETIRAcetam (KEPPRA) tablet 500 mg  500 mg Oral BID Meuth, Brooke A, PA-C   500 mg at 10/18/20 1011  . LORazepam (ATIVAN) tablet 0.25 mg  0.25 mg Oral Q6H PRN Norm Parcel, PA-C   0.25 mg at 10/18/20 0322  . methocarbamol (ROBAXIN) tablet 1,000 mg  1,000 mg Oral Q8H Meuth, Brooke A, PA-C   1,000 mg at 10/18/20 1348  . metoprolol tartrate (LOPRESSOR) injection 5 mg  5 mg Intravenous Q6H PRN Meuth, Brooke A, PA-C      . mirtazapine (REMERON) tablet 15 mg  15 mg Oral QHS Norm Parcel, PA-C   15 mg at 10/17/20 2045  . nicotine (NICODERM CQ - dosed in mg/24 hours) patch 14 mg  14 mg Transdermal Daily Knox Royalty, NP   14 mg at 10/18/20 1349  . OLANZapine (ZYPREXA) tablet 7.5 mg  7.5 mg Oral Daily Barkley Boards R, PA-C   7.5 mg at 10/18/20 1011  . promethazine (PHENERGAN) suppository 12.5 mg  12.5 mg Rectal Q6H PRN Norm Parcel, PA-C      . sertraline (ZOLOFT) tablet 150 mg  150 mg Oral Daily Barkley Boards R, PA-C   150 mg at 10/18/20 1012  . sodium chloride (OCEAN) 0.65 % nasal spray 1 spray  1 spray Each Nare PRN Meuth, Blaine Hamper, PA-C         Discharge Medications: Please see discharge summary for a list of discharge medications.  Relevant Imaging Results:  Relevant Lab Results:   Additional Information SS # [id number]    Reinaldo Raddle, RN, BSN  Trauma/Neuro ICU Case Manager [phone]

## 2020-10-18 NOTE — Progress Notes (Signed)
IP rehab admissions - Please see consult note from Dr. Naaman Plummer from today.  Also, please see note from case manager, Almyra Free, from yesterday.  Plan now is for SNF placement.  I will sign off for inpatient rehab at this time.  Call me for questions.  [phone]
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
## 2020-10-18 NOTE — TOC Progression Note (Signed)
Transition of Care [facility]) - Progression Note    Patient Details  Name: Leslie Boyd MRN: [id number] Date of Birth: [birth year]  Transition of Care [facility]) CM/SW Contact  Oren Section Cleta Alberts, RN Phone Number: 10/18/2020, 4:42 PM  Clinical Narrative: FL-2 initiated for SNF bed search; noted Palliative Medicine Team following for goals of care. Spoke with daughter and husband today; they are interested in SNF for rehab, should pt continue to progress with therapies.  Will follow with updates as available.      Expected Discharge Plan: [place] Barriers to Discharge: Continued Medical Work up  Expected Discharge Plan and Services Expected Discharge Plan: [place]   Discharge Planning Services: CM Consult   Living arrangements for the past 2 months: Single Family Home                                       Social Determinants of Health (SDOH) Interventions    Readmission Risk Interventions No flowsheet data found.  Reinaldo Raddle, RN, BSN  Trauma/Neuro ICU Case Manager [phone]
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
## 2020-10-18 NOTE — Consult Note (Signed)
Referral MD  Reason for Referral: Metastatic renal cell carcinoma; traumatic fractures from a fall  Chief Complaint  Patient presents with  . Fall  : Patient cannot give any history.  HPI: Ms. Hattabaugh is well-known to me.  She is 75 year old white female.  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  This was resected back in October 2019.  We recently have been seeing her because she was found to have a mass in the left kidney.  She had retroperitoneal lymph nodes.  She ultimately had a biopsy of left kidney mass.  This was done on 09/20/2020.  The pathology report ([id number]) showed carcinoma with squamous differentiation.  The differential includes squamous of carcinoma or urothelial carcinoma with a prominent squamous differentiation.  She had an adenocarcinoma the left lung so I do not believe that this is a metastatic lung cancer.  She had molecular markers that were done which were all pretty much unremarkable.  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  She only has disease down her abdomen.  She has retroperitoneal lymph nodes.  Unfortunately, she has a very poor performance status.  She recently fell.  She is in the neuro ICU.  She has fractures on the face.  She has a subdural hematoma.  She has a intraventricular bleed.  She really was not that alert this morning.  I am not sure she knew who I was.  She has an element of dementia.  I do not believe that we will be able to treat her unless she really starts making some improvement in her performance status.  The only option that we would have treatment was really would be immunotherapy and possibly a TKI agent.  I do still think she would be tolerable of either of these right now.  I wish that there was more in the way of molecular abnormalities that we might be able to target.  Unfortunately there just are not.  She is quite frail.  At best, her performance status is by ECOG 3.   Past Medical  History:  Diagnosis Date  . Anxiety   . Arthritis   . Cancer ([place])   . Coronary artery calcification seen on CT scan    cardiologist-- dr Atilano Median [facility] in [facility])  . GERD (gastroesophageal reflux disease)   . History of kidney stones    ureter stent in place  . History of stress test    stress echo 01-29-2018 with dr Atilano Median, cardiologist with [facility] in [facility]  . Hypertension   . 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 in [facility])  . Osteopenia   . Renal lesion    left renal pelvis  . Renal mass, left 08/30/2020  :  Past Surgical History:  Procedure Laterality Date  . CATARACT EXTRACTION W/ INTRAOCULAR LENS  IMPLANT, BILATERAL  2016  . [place]  . CYSTOSCOPY WITH RETROGRADE PYELOGRAM, URETEROSCOPY AND STENT PLACEMENT Left 04/27/2018   Procedure: CYSTOSCOPY WITH BILATERAL RETROGRADE PYELOGRAM, POSSIBLE LEFT URETEROSCOPY AND POSSIBLE STENT PLACEMENT;  Surgeon: Ceasar Mons, MD;  Location: [facility];  Service: Urology;  Laterality: Left;  . SKIN GRAFT    . TONSILLECTOMY  1963  . VIDEO ASSISTED THORACOSCOPY (VATS)/WEDGE RESECTION Left 05/03/2018   Procedure: VIDEO ASSISTED THORACOSCOPY (VATS)/WEDGE RESECTION;  Surgeon: [name], MD;  Location: [facility];  Service: Thoracic;  Laterality: Left;  :   Current Facility-Administered Medications:  .  [COMPLETED] sodium chloride 0.9 % bolus 1,000 mL, 1,000 mL, Intravenous, Once, Stopped at 10/16/20 0931 **AND** 0.9 %  sodium chloride infusion, , Intravenous, Continuous, Norm Parcel, PA-C, Last Rate: 75 mL/hr at 10/17/20 1700, Infusion Verify at 10/17/20 1700 .  acetaminophen (TYLENOL) tablet 1,000 mg, 1,000 mg, Oral, Q6H, Lovick, Montel Culver, MD, 1,000 mg at 10/18/20 0321 .  amLODipine (NORVASC) tablet 5 mg, 5 mg, Oral, Daily, Norm Parcel, PA-C, 5 mg at 10/17/20 1022 .  atorvastatin (LIPITOR) tablet 20 mg, 20 mg,  Oral, Daily, Norm Parcel, PA-C, 20 mg at 10/17/20 1022 .  Chlorhexidine Gluconate Cloth 2 % PADS 6 each, 6 each, Topical, Daily, Consuella Lose, MD, 6 each at 10/17/20 1024 .  docusate sodium (COLACE) capsule 100 mg, 100 mg, Oral, BID, Jesusita Oka, MD, 100 mg at 10/17/20 0834 .  feeding supplement (ENSURE ENLIVE / ENSURE PLUS) liquid 237 mL, 237 mL, Oral, BID BM, Georganna Skeans, MD, 237 mL at 10/17/20 1451 .  levETIRAcetam (KEPPRA) IVPB 500 mg/100 mL premix, 500 mg, Intravenous, Q12H, Costella, Vincent Lenna Sciara, PA-C, Last Rate: 400 mL/hr at 10/17/20 2305, 500 mg at 10/17/20 2305 .  LORazepam (ATIVAN) tablet 0.25 mg, 0.25 mg, Oral, Q6H PRN, Norm Parcel, PA-C, 0.25 mg at 10/18/20 0322 .  methocarbamol (ROBAXIN) 1,000 mg in dextrose 5 % 100 mL IVPB, 1,000 mg, Intravenous, Q8H, Lovick, Montel Culver, MD, Last Rate: 200 mL/hr at 10/18/20 0019, 1,000 mg at 10/18/20 0019 .  mirtazapine (REMERON) tablet 15 mg, 15 mg, Oral, QHS, Norm Parcel, PA-C, 15 mg at 10/17/20 2045 .  OLANZapine (ZYPREXA) tablet 7.5 mg, 7.5 mg, Oral, Daily, Norm Parcel, PA-C, 7.5 mg at 10/17/20 1134 .  promethazine (PHENERGAN) suppository 12.5 mg, 12.5 mg, Rectal, Q6H PRN, Norm Parcel, PA-C .  sertraline (ZOLOFT) tablet 150 mg, 150 mg, Oral, Daily, Norm Parcel, PA-C, 150 mg at 10/17/20 1022:  . acetaminophen  1,000 mg Oral Q6H  . amLODipine  5 mg Oral Daily  . atorvastatin  20 mg Oral Daily  . Chlorhexidine Gluconate Cloth  6 each Topical Daily  . docusate sodium  100 mg Oral BID  . feeding supplement  237 mL Oral BID BM  . mirtazapine  15 mg Oral QHS  . OLANZapine  7.5 mg Oral Daily  . sertraline  150 mg Oral Daily  :  Allergies  Allergen Reactions  . Ondansetron Hcl Other (See Comments)    Excessive sweating  . Morphine Nausea And Vomiting  . Morphine And Related Nausea And Vomiting and Rash  :  Family History  Problem Relation Age of Onset  . Heart disease Father   . COPD Sister   .  Heart disease Brother   . Colon cancer Neg Hx   :  Social History   Socioeconomic History  . Marital status: Married    Spouse name: Not on file  . Number of children: Not on file  . Years of education: Not on file  . Highest education level: Not on file  Occupational History  . Not on file  Tobacco Use  . Smoking status: Current Every Day Smoker    Packs/day: 0.50    Years: 53.00    Pack years: 26.50    Types: Cigarettes  . Smokeless tobacco: Never Used  . Tobacco comment: smokes 4 cigarettes a day  Vaping Use  . Vaping  Use: Never used  Substance and Sexual Activity  . Alcohol use: Not Currently    Comment: occ  . Drug use: No  . Sexual activity: Yes  Other Topics Concern  . Not on file  Social History Narrative  . Not on file   Social Determinants of Health   Financial Resource Strain: Not on file  Food Insecurity: Not on file  Transportation Needs: Not on file  Physical Activity: Not on file  Stress: Not on file  Social Connections: Not on file  Intimate Partner Violence: Not on file  :  Review of Systems  Unable to perform ROS: Medical condition     Exam: This is a very frail, elderly-appearing white female.  She has a cervical collar on.  She has ecchymoses on her face.  Her vital signs are temperature of 97.6.  Pulse 94.  Blood pressure 174/95.  Last weight was 71 pounds.  Her lungs are clear.  Cardiac exam regular rate and rhythm.  Abdomen is soft.  Bowel sounds are present.  There is no abdominal mass.  She has no obvious fluid wave.  There is no palpable liver or spleen tip.  Extremities shows some scattered ecchymoses.  She has some muscle atrophy in upper and lower extremities.  Neurological exam shows no focal deficits.      Patient Vitals for the past 24 hrs:  BP Temp Temp src Pulse Resp SpO2  10/18/20 0305 (!) 174/95 97.6 F (36.4 C) Axillary 94 20 99 %  10/18/20 0022 (!) 161/86 98.9 F (37.2 C) Axillary 95 20 98 %  10/17/20 2042 (!) 155/73  98 F (36.7 C) Axillary 84 19 100 %  10/17/20 1900 (!) 168/80 -- -- 82 (!) 21 99 %  10/17/20 1730 133/89 -- -- 96 (!) 24 96 %  10/17/20 1630 116/72 -- -- 74 18 100 %  10/17/20 1600 (!) 152/85 98.1 F (36.7 C) Axillary 85 17 98 %  10/17/20 1530 (!) 138/105 -- -- -- 18 --  10/17/20 1500 (!) 157/79 -- -- -- (!) 24 --  10/17/20 1430 (!) 145/70 -- -- -- 20 --  10/17/20 1400 (!) 137/97 -- -- 80 (!) 21 99 %  10/17/20 1330 136/90 -- -- 81 19 98 %  10/17/20 1300 124/61 -- -- 73 17 100 %  10/17/20 1230 (!) 108/54 -- -- 73 19 97 %  10/17/20 1215 -- -- -- 72 18 98 %  10/17/20 1200 127/83 97.7 F (36.5 C) Axillary 85 15 96 %  10/17/20 1130 -- -- -- (!) 105 18 (!) 72 %  10/17/20 1100 (!) 148/63 -- -- 86 (!) 23 98 %  10/17/20 1030 (!) 163/75 -- -- 82 19 97 %  10/17/20 1022 (!) 146/75 -- -- -- -- --  10/17/20 1000 -- -- -- 93 (!) 25 97 %  10/17/20 0930 (!) 152/86 -- -- 88 18 98 %  10/17/20 0900 (!) 153/66 -- -- 72 17 94 %  10/17/20 0830 -- -- -- 85 18 96 %  10/17/20 0800 (!) 158/85 97.9 F (36.6 C) Axillary 82 20 97 %  10/17/20 0730 -- -- -- 84 (!) 21 97 %     Recent Labs    10/16/20 0601 10/16/20 0922 10/17/20 0944  WBC 16.1*  --  13.7*  HGB 10.4* 9.2* 9.5*  HCT 34.2* 27.0* 30.5*  PLT 467*  --  446*   Recent Labs    10/16/20 0601 10/16/20 0922 10/17/20 0944  NA 139  139 137  K 4.0 3.9 3.3*  CL 97* 101 103  CO2 31  --  26  GLUCOSE 135* 109* 115*  BUN 20 16 7*  CREATININE 0.94 0.70 0.73  CALCIUM 9.3  --  8.9    Blood smear review: None  Pathology: None    Assessment and Plan: Ms. Gural is a 75 year old white female.  She is quite frail.  She fell and sustained fractures.  She has a subdural hematoma and a intraventricular bleed.  She has metastatic renal cell carcinoma.  This appears to be an unusual subtype that has squamous differentiation.  Regardless, I just do not see that she is a candidate for any therapy at the present time.  I just do not believe that she is  strong enough to take any treatment.  If she does not improve her status with therapy, though we might think about treating her.  For right now, I really believe that we are dealing with quality of life issues.  Hopefully, should be able to go to rehab.  How much  she will accomplish is unclear.  I know she is getting fantastic care from all staff up on  4NP.  Lattie Haw, MD  Hebrews 12:12

## 2020-10-18 NOTE — Progress Notes (Signed)
Patient arrived from 4N ICU in bed, skin assessment completed and sacral foam placed. Patient placed on cardiac monitoring and VS within normal limits. Bed in lowest position, bed alarm on and call bell within reach.

## 2020-10-18 NOTE — Progress Notes (Addendum)
Nutrition Follow-up  DOCUMENTATION CODES:   Severe malnutrition in context of chronic illness,Underweight  INTERVENTION:   -Continue Ensure Enlive po BID, each supplement provides 350 kcal and 20 grams of protein -Continue Magic cup TID with meals, each supplement provides 290 kcal and 9 grams of protein -Feeding assistance with meals -Initiate 48 hour calorie count per MD -If intake remains poor and enteral nutrition is within pt's goals of care, consider:   Initiate Osmolite 1.2 @ 20 ml/hr and increase by 10 ml every 12 hours to goal rate of 50 ml/hr.   If no IVFs, add 130 ml free water flush every 6 hours  Tube feeding regimen provides 1440 kcal (100% of needs), 67 grams of protein, and 984 ml of H2O. Total free water: 1504 ml daily  -If feedings are started, monitor K, Mg, and Phos daily and replete as needed due to high refeeding risk  NUTRITION DIAGNOSIS:   Severe Malnutrition related to chronic illness as evidenced by severe muscle depletion,severe fat depletion.  Ongoing  GOAL:   Patient will meet greater than or equal to 90% of their needs  Unmet  MONITOR:   PO intake,Supplement acceptance  REASON FOR ASSESSMENT:   Rounds    ASSESSMENT:   Pt with PMH of current smoker, LUL lung mass s/p L wedge resection 2019, L renal mass, GERD, HTN, and osteopenia who was found down at home by husband admitted with SAH, C2 fx, orbital fx with significant orbital content displacement.  Pt admitted with traumatic SAH, punctuate ICH, C2 fracture, and facial fracture.   Reviewed I/O's: +1.9 L x 24 hours and +5.1 L since admission  UOP: 355 ml x 24 hours  Pt unavailable at time of visit. Attempted to speak with pt via call to hospital room, however, unable to reach.   Per chart review, pt is very lethargic; she is not opening her eyes and remains in restraints.   Intake remains very poor. Noted meal completions 5-10%. Reviewed meal intake over the past 24 hours- pt  consumed approximately 157 kcals and 6 grams protein, meeting 13% of estimated kcal needs and 11% of estimated protein needs.   Case discussed with general surgery PA and Palliative care NP. Plan to hold off on cortrak for now, however, may be reconsidered based upon goals of care discussions. Per PCT NP, family is leaning towards comfort care but would like to see if pt progresses. Calorie count in place to determine how much PO pt is taking in.   Medications reviewed and include colace, keppra, and remeron.  Labs reviewed: K: 3.3.   Diet Order:   Diet Order            DIET DYS 3 Room service appropriate? No; Fluid consistency: Thin  Diet effective now                 EDUCATION NEEDS:   Not appropriate for education at this time  Skin:  Skin Assessment: Reviewed RN Assessment  Last BM:  unknown  Height:   Ht Readings from Last 1 Encounters:  10/16/20 4\' 11"  (1.499 m)    Weight:   Wt Readings from Last 1 Encounters:  10/16/20 32.3 kg    Ideal Body Weight:  44.6 kg  BMI:  Body mass index is 14.38 kg/m.  Estimated Nutritional Needs:   Kcal:  1200-1400  Protein:  55-65 grams  Fluid:  >1.5 L/day    Loistine Chance, RD, LDN, CDCES Registered Dietitian II Certified Diabetes Care  and Education Specialist Please refer to [facility] for RD and/or RD on-call/weekend/after hours pager

## 2020-10-19 LAB — CBC
HCT: 32.2 % — ABNORMAL LOW (ref 36.0–46.0)
Hemoglobin: 10 g/dL — ABNORMAL LOW (ref 12.0–15.0)
MCH: 26.8 pg (ref 26.0–34.0)
MCHC: 31.1 g/dL (ref 30.0–36.0)
MCV: 86.3 fL (ref 80.0–100.0)
Platelets: 453 10*3/uL — ABNORMAL HIGH (ref 150–400)
RBC: 3.73 MIL/uL — ABNORMAL LOW (ref 3.87–5.11)
RDW: 16.3 % — ABNORMAL HIGH (ref 11.5–15.5)
WBC: 14.4 10*3/uL — ABNORMAL HIGH (ref 4.0–10.5)
nRBC: 0 % (ref 0.0–0.2)

## 2020-10-19 LAB — BASIC METABOLIC PANEL
Anion gap: 7 (ref 5–15)
BUN: 11 mg/dL (ref 8–23)
CO2: 24 mmol/L (ref 22–32)
Calcium: 9 mg/dL (ref 8.9–10.3)
Chloride: 105 mmol/L (ref 98–111)
Creatinine, Ser: 0.82 mg/dL (ref 0.44–1.00)
GFR, Estimated: >60 mL/min (ref >60)
Glucose, Bld: 87 mg/dL (ref 70–99)
Potassium: 3.7 mmol/L (ref 3.5–5.1)
Sodium: 136 mmol/L (ref 135–145)

## 2020-10-19 LAB — MAGNESIUM: Magnesium: 1.9 mg/dL (ref 1.7–2.4)

## 2020-10-19 MED ORDER — LEVETIRACETAM 100 MG/ML PO SOLN
500.0000 mg | Freq: Two times a day (BID) | ORAL | Status: AC
  Administered 2020-10-19 – 2020-10-22 (×8): 500 mg via ORAL
  Filled 2020-10-19 (×8): qty 5

## 2020-10-19 NOTE — Progress Notes (Signed)
  Speech Language Pathology Treatment: Dysphagia  Patient Details Name: Leslie Boyd MRN: [id number] DOB: Feb 13, 1946 Today's Date: 10/19/2020 Time: 1100-1110 SLP Time Calculation (min) (ACUTE ONLY): 10 min  Assessment / Plan / Recommendation Clinical Impression  Pt remains lethargic- her husband was at bedside.  He voiced concerns about her poor PO intake, citing chronic issues with poor appetite and weight loss.  Today, she was repositioned as upright as possible in an effort to help her LOA. After max verbal cues to open her eyes and participate, she accepted limited sips of water and only two half teaspoons of pudding.  She appeared to tolerate POs without concern for aspiration, but the primary issue is MS and intake.  Per chart review, cortrak may be placed for nocturnal feedings.  SLP will follow for swallowing/cognitive deficits. D/W RN.  HPI HPI: This 75 y.o. female admitted after being found on floor by her spouse.  She was found ot have traumatic SAH, and punctate ICH; C2 fracture, and facial fx.  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  PMH includes: new diagnosis metastatic renal cancer, h/o lung CA, Anxiety, osteopenia, cognitive dysfunction with behavioral changes      SLP Plan  Continue with current plan of care  Patient needs continued Speech Lanaguage Pathology Services    Recommendations  Diet recommendations: Dysphagia 1 (puree);Thin liquid Liquids provided via: Straw Medication Administration: Crushed with puree Supervision: Staff to assist with self feeding Compensations: Minimize environmental distractions Postural Changes and/or Swallow Maneuvers: Seated upright 90 degrees                Oral Care Recommendations: Oral care BID Follow up Recommendations: Skilled Nursing facility SLP Visit Diagnosis: Dysphagia, unspecified (R13.10) Plan: Continue with current plan of care       GO               Couper Juncaj L. Tivis Ringer, [place] CCC/SLP Acute  Rehabilitation Services Office number [phone] Pager [phone]  [name] 10/19/2020, 11:23 AM

## 2020-10-19 NOTE — TOC Progression Note (Signed)
Transition of Care [facility]) - Progression Note    Patient Details  Name: Leslie Boyd MRN: [id number] Date of Birth: [birth year]  Transition of Care [facility] [place]) CM/SW Contact  Oren Section Cleta Alberts, RN Phone Number: 10/19/2020, 3:23 PM  Clinical Narrative:   Noted family's decision for DNR/DNI with no artificial feeding.  TOC team to continue to follow for disposition.      Expected Discharge Plan: [place] Barriers to Discharge: Continued Medical Work up  Expected Discharge Plan and Services Expected Discharge Plan: [place]   Discharge Planning Services: CM Consult   Living arrangements for the past 2 months: Single Family Home                                       Social Determinants of Health (SDOH) Interventions    Readmission Risk Interventions No flowsheet data found.  Reinaldo Raddle, RN, BSN  Trauma/Neuro ICU Case Manager [phone]

## 2020-10-19 NOTE — Progress Notes (Signed)
Cortrak Tube Team Note:  Consult received to place a Cortrak feeding tube.  Noted that after conversations with MD, RN, RD, and Palliative pt is now a DNR/DNI with no artifical feedings planned.  Cortrak consult d/c'ed.  Please reach out if needed.    Lockie Pares., RD, LDN, CNSC See AMiON for contact information

## 2020-10-19 NOTE — Progress Notes (Signed)
Palliative:  Additional conversations held with daughter and palliative medicine team. Decisions made for: DNR/DNI, NO artificial feeding. Continue all other interventions over the weekend. Dr. Bobbye Morton and RN aware of above. Wadie Lessen, NP will follow up with family Monday.  Juel Burrow, DNP, AGNP-C Palliative Medicine Team Team Phone # [phone]  Pager # [phone]  NO CHARGE

## 2020-10-19 NOTE — Evaluation (Signed)
Speech Language Pathology Evaluation Patient Details Name: Leslie Boyd MRN: [id number] DOB: Jun 12, 1946 Today's Date: 10/19/2020 Time: 1044-1100 SLP Time Calculation (min) (ACUTE ONLY): 15.67 min  Problem List:  Patient Active Problem List   Diagnosis Date Noted  . Protein-calorie malnutrition, severe 10/17/2020  . ICH (intracerebral hemorrhage) ([place]) 10/16/2020  . Renal mass, left 08/30/2020  . Dementia with behavioral disturbance ([place]) 12/08/2019  . GAD (generalized anxiety disorder) 12/08/2019  . Gastroesophageal reflux disease without esophagitis 12/08/2019  . Intentional drug overdose ([place]) 12/08/2019  . Cognitive impairment 11/16/2019  . Dermatochalasis of both upper eyelids 04/26/2019  . Keratoconjunctivitis sicca of both eyes not specified as Sjogren's 04/26/2019  . Adenocarcinoma of left lung, stage 1 ([place]) 05/07/2018  . Aortic atherosclerosis ([place]) 03/15/2018  . Substance abuse ([place])   . 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 Vitamin D deficiency 07/21/2017  . Coronary artery calcification seen on CAT scan 07/15/2017  . Episodic tobacco dependence 09/01/2016  . Essential hypertension 09/01/2016  . Nodule of left lung 09/01/2016  . Hypercholesteremia 07/18/2016  . Indigestion 03/17/2016  . Mixed emotional features as adjustment reaction 03/17/2016  . Osteopenia 03/17/2016   Past Medical History:  Past Medical History:  Diagnosis Date  . Anxiety   . Arthritis   . Cancer ([place])   . Coronary artery calcification seen on CT scan    cardiologist-- dr Atilano Median [facility] in [facility])  . GERD (gastroesophageal reflux disease)   . History of kidney stones    ureter stent in place  . History of stress test    stress echo 01-29-2018 with dr Atilano Median, cardiologist with [facility] in [facility]  . Hypertension   . 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 Osteopenia   . Renal lesion     left renal pelvis  . Renal mass, left 08/30/2020   Past Surgical History:  Past Surgical History:  Procedure Laterality Date  . CATARACT EXTRACTION W/ INTRAOCULAR LENS  IMPLANT, BILATERAL  2016  . [place]  . CYSTOSCOPY WITH RETROGRADE PYELOGRAM, URETEROSCOPY AND STENT PLACEMENT Left 04/27/2018   Procedure: CYSTOSCOPY WITH BILATERAL RETROGRADE PYELOGRAM, POSSIBLE LEFT URETEROSCOPY AND POSSIBLE STENT PLACEMENT;  Surgeon: Ceasar Mons, MD;  Location: [facility];  Service: Urology;  Laterality: Left;  . SKIN GRAFT    . TONSILLECTOMY  1963  . VIDEO ASSISTED THORACOSCOPY (VATS)/WEDGE RESECTION Left 05/03/2018   Procedure: VIDEO ASSISTED THORACOSCOPY (VATS)/WEDGE RESECTION;  Surgeon: [name], MD;  Location[facility] OR;  Service: Thoracic;  Laterality: Left;   HPI:  This 75 y.o. female admitted after being found on floor by her spouse.  She was found ot have traumatic SAH, and punctate ICH; C2 fracture, and facial fx.  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  PMH includes: new diagnosis metastatic renal cancer, h/o lung CA, Anxiety, osteopenia, cognitive dysfunction with behavioral changes   Assessment / Plan / Recommendation Clinical Impression  Pt with persisting lethargy; she could awaken briefly for transient participation, then would fall asleep again. Participated in limited TBI/cognitive assessment.  Pt followed simple, one-step commands. Speech was fluent and without dysarthria.  She occasionally made needs known ("can you take these off?" referencing mitts). She was oriented to person only. She often perseverated on verbal output and was unable to shift set despite max cues. Attention  was significantly impaired, impacting cognition globally.  She is functioning at a RL V. Mr. Keil was at bedside - we talked about her current status and his hope that she would begin to wake up and participate more.  SLP will follow  for cognition and swallowing.    SLP Assessment  SLP Recommendation/Assessment: Patient needs continued Speech Lanaguage Pathology Services SLP Visit Diagnosis: Cognitive communication deficit (R41.841)    Follow Up Recommendations  Skilled Nursing facility    Frequency and Duration min 2x/week  2 weeks      SLP Evaluation Cognition  Overall Cognitive Status: Impaired/Different from baseline Arousal/Alertness: Lethargic Orientation Level: Oriented to person;Disoriented to place;Disoriented to time;Disoriented to situation Attention: Focused Focused Attention: Impaired Focused Attention Impairment: Verbal basic Memory: Impaired Memory Impairment: Storage deficit Awareness: Impaired Safety/Judgment: Impaired [place] [employer] Scales of Cognitive Functioning: Confused/inappropriate/non-agitated       Comprehension  Auditory Comprehension Overall Auditory Comprehension: Impaired Yes/No Questions: Impaired Basic Biographical Questions: 0-25% accurate Commands: Impaired One Step Basic Commands: 75-100% accurate Two Step Basic Commands: 25-49% accurate Interfering Components: Attention Visual Recognition/Discrimination Discrimination: Not tested    Expression Expression Primary Mode of Expression: Verbal Verbal Expression Overall Verbal Expression: Other (comment) (minimal output) Automatic Speech: Social Response Level of Generative/Spontaneous Verbalization: Phrase Naming: Impairment Written Expression Dominant Hand: Right Written Expression: Not tested   Oral / Motor  Oral Motor/Sensory Function Overall Oral Motor/Sensory Function: Other (comment) (symmetric features; bruising from fall) Motor Speech Overall Motor Speech: Appears within functional limits for tasks assessed   GO                    [name] 10/19/2020, 11:20 AM  Estill Bamberg L. Tivis Ringer, [place] Office number [phone] Pager [phone]

## 2020-10-19 NOTE — Progress Notes (Addendum)
Brief Nutrition Note  Consult received this morning to initiate nocturnal tube feeding; however, family has since decided to make pt DNR and have decided against artificial feeding. Will d/c consult for TF initiation/management. Will also d/c calorie count given no plans for nutrition support. Discussed with MD and RN. Recommend continuing with interventions as described in RD assessment from yesterday, 4/14.   Larkin Ina, MS, RD, LDN RD pager number and weekend/on-call pager number located in [place].

## 2020-10-19 NOTE — Progress Notes (Signed)
Trauma/Critical Care Follow Up Note  Subjective:    Overnight Issues:   Objective:  Vital signs for last 24 hours: Temp:  [97.5 F (36.4 C)-98.8 F (37.1 C)] 98.2 F (36.8 C) (04/15 0746) Pulse Rate:  [85-94] 90 (04/15 0746) Resp:  [18-25] 18 (04/15 0746) BP: (144-170)/(72-85) 170/85 (04/15 0746) SpO2:  [96 %-100 %] 99 % (04/15 0746)  Hemodynamic parameters for last 24 hours:    Intake/Output from previous day: 04/14 0701 - 04/15 0700 In: 210 [P.O.:210] Out: -   Intake/Output this shift: No intake/output data recorded.  Vent settings for last 24 hours:    Physical Exam:  Gen: comfortable, no distress Neuro: not interactive HEENT: PERRL Neck: supple CV: RRR Pulm: unlabored breathing Abd: soft, NT GU: clear yellow urine Extr: wwp, no edema   Results for orders placed or performed during the hospital encounter of 10/16/20 (from the past 24 hour(s))  CBC     Status: Abnormal   Collection Time: 10/19/20  2:45 AM  Result Value Ref Range   WBC 14.4 (H) 4.0 - 10.5 K/uL   RBC 3.73 (L) 3.87 - 5.11 MIL/uL   Hemoglobin 10.0 (L) 12.0 - 15.0 g/dL   HCT 32.2 (L) 36.0 - 46.0 %   MCV 86.3 80.0 - 100.0 fL   MCH 26.8 26.0 - 34.0 pg   MCHC 31.1 30.0 - 36.0 g/dL   RDW 16.3 (H) 11.5 - 15.5 %   Platelets 453 (H) 150 - 400 K/uL   nRBC 0.0 0.0 - 0.2 %  Basic metabolic panel     Status: None   Collection Time: 10/19/20  2:45 AM  Result Value Ref Range   Sodium 136 135 - 145 mmol/L   Potassium 3.7 3.5 - 5.1 mmol/L   Chloride 105 98 - 111 mmol/L   CO2 24 22 - 32 mmol/L   Glucose, Bld 87 70 - 99 mg/dL   BUN 11 8 - 23 mg/dL   Creatinine, Ser 0.82 0.44 - 1.00 mg/dL   Calcium 9.0 8.9 - 10.3 mg/dL   GFR, Estimated >60 >60 mL/min   Anion gap 7 5 - 15  Magnesium     Status: None   Collection Time: 10/19/20  2:45 AM  Result Value Ref Range   Magnesium 1.9 1.7 - 2.4 mg/dL    Assessment & Plan:   Present on Admission: . ICH (intracerebral hemorrhage) ([place])    LOS: 3  days   Additional comments:I reviewed the patient's new clinical lab test results.   and I reviewed the patients new imaging test results.    Found down SAH-repeat [facility] 4/13 stable, NS signed off, TBI therapies, keppra x1 week Possible MCA thrombus- noted on CT head, CTA negative, brain perfusion scannegative C2 fracture- collar present, NSrecommends collar and OP follow up L tetrapod facial fracture- ENTDr. Wilburn Cornelia recommends non-op management, fracture precautions, nasal spray, ice, liquid/soft diet as tolerated, PO abx x10 days, follow up 1 week HTN-home meds. BP remains elevated, add IV metoprolol PRN GERD Hx of lung mass s/p resection 2019, Hx of renal mass/carcinoma - seen by Dr. Marin Olp, not currently candidate for immunotherapy CAD Anxiety, hx suicide attempt 2021- home psych meds  Dementia FEN:IVF, downgraded to D1 diet by SLP, Ensure, place cortrak today for nocturnal feeds  VTE: SCDs, start LMWH, 30mg  daily due to weight ID: Ancef x1, augmentin 4/14>>4/23 (x10 days per ENT), send UA as WBC 14 Dispo: Continue TBI team therapies, plan for SNF placement. Palliative on board.  Leslie Oka, MD Trauma & General Surgery Please use [email or web address] to contact on call provider  10/19/2020  *Care during the described time interval was provided by me. I have reviewed this patient's available data, including medical history, events of note, physical examination and test results as part of my evaluation.

## 2020-10-19 NOTE — Progress Notes (Signed)
Occupational Therapy Treatment Patient Details Name: Leslie Boyd MRN: [id number] DOB: 1946/02/17 Today's Date: 10/19/2020    History of present illness This 75 y.o. female presented 4/12 after being found on floor by her spouse.  She was found to have traumatic SAH, and punctate ICH; C2 fracture, and facial fx.  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  PMH includes: new diagnosis metastatic renal cancer, h/o lung CA, Anxiety, osteopenia, cognitive dysfunction with behavioral changes (See notes in chart review 05/22/2020)   OT comments  Pt. Seen for skilled OT treatment.  Pts. Husband and son present for session.  Grooming tasks attempted. Pt. With poor arousal and participation.  Total a for grooming task of washing face.  Able to engage minimally for sips of water and bite of pudding left by ST who had seen her previously.  Pt. Stating when she wanted another sip and did communicate she did not like the pudding.  Would close her eyes after each swallow. Max tactile to engage again.    Pt. Left sleeping at end of session with rn meeting with son and husband to answer all questions and provide updates on her POC. Will alert OTR/L to adjust follow up recommendations in conjunction with any necessary goal modifications needed.    Follow Up Recommendations  SNF    Equipment Recommendations  None recommended by OT    Recommendations for Other Services      Precautions / Restrictions Precautions Precautions: Fall;Cervical Precaution Comments: reminded pt of cervical precautions; bil mittens Required Braces or Orthoses: Cervical Brace Cervical Brace: Hard collar;At all times       Mobility Bed Mobility                    Transfers                      Balance                                           ADL either performed or assessed with clinical judgement   ADL Overall ADL's : Needs assistance/impaired Eating/Feeding: Maximal  assistance;Bed level Eating/Feeding Details (indicate cue type and reason): some verbalization if she wanted another sip, able to state she did not like the pudding.  3 sips of water, did cough with pudding but it was more paired with facial expressions of not liking it Grooming: Wash/dry face;Total assistance;Bed level                                 General ADL Comments: pt. difficult to arouse and maintain arousal for participation.  opening eyes x2 to say hello to her son.  able to recall names of her 2 grand dtrs.  closing eyes often and not engaging with max stimulus     Vision       Perception     Praxis      Cognition     Overall Cognitive Status: Impaired/Different from baseline                 [place] Levels of Cognitive Functioning [place] [employer] Scales of Cognitive Functioning: Confused/inappropriate/non-agitated  Exercises     Shoulder Instructions       General Comments  accidentally called the pt. Romie Minus vsRemo Lipps.  Son politely corrected me.  I apologized and used correct name throughout remainder of session.      Pertinent Vitals/ Pain       Pain Assessment: Faces Faces Pain Scale: No hurt  Home Living     Available Help at Discharge: Family;Available 24 hours/day Type of Home: House                              Lives With: Spouse    Prior Functioning/Environment              Frequency  Min 2X/week        Progress Toward Goals  OT Goals(current goals can now be found in the care plan section)  Progress towards OT goals: OT to reassess next treatment     Plan Discharge plan needs to be updated    Co-evaluation                 AM-PAC OT "6 Clicks" Daily Activity     Outcome Measure   Help from another person eating meals?: A Lot Help from another person taking care of personal grooming?: A Lot Help from another person toileting, which includes using toliet,  bedpan, or urinal?: A Lot Help from another person bathing (including washing, rinsing, drying)?: A Lot Help from another person to put on and taking off regular upper body clothing?: Total Help from another person to put on and taking off regular lower body clothing?: Total 6 Click Score: 10    End of Session Equipment Utilized During Treatment: Cervical collar  OT Visit Diagnosis: Unsteadiness on feet (R26.81);Cognitive communication deficit (R41.841)   Activity Tolerance Patient limited by lethargy   Patient Left in bed;with call bell/phone within reach;with bed alarm set;with restraints reapplied;with family/visitor present;with nursing/sitter in room   Nurse Communication          Time: 2297-9892 OT Time Calculation (min): 19 min  Charges: OT General Charges $OT Visit: 1 Visit OT Treatments $Self Care/Home Management : 8-22 mins  Sonia Baller, COTA/L Acute Rehabilitation [phone]   10/19/2020, 1:59 PM

## 2020-10-20 MED ORDER — LORAZEPAM 2 MG/ML IJ SOLN
0.2500 mg | INTRAMUSCULAR | Status: DC | PRN
  Administered 2020-10-20 – 2020-10-24 (×6): 0.25 mg via INTRAVENOUS
  Filled 2020-10-20 (×7): qty 1

## 2020-10-20 NOTE — Progress Notes (Signed)
Patient ID: Leslie Boyd, female   DOB: [birth year], 75 y.o.   MRN: [id number]     Subjective: Does not offer complaint ROS negative except as listed above. Objective: Vital signs in last 24 hours: Temp:  [97.6 F (36.4 C)-98.7 F (37.1 C)] 98.1 F (36.7 C) (04/16 0747) Pulse Rate:  [83-101] 95 (04/16 0747) Resp:  [17-24] 24 (04/16 0747) BP: (139-174)/(75-96) 141/76 (04/16 0747) SpO2:  [98 %-100 %] 99 % (04/16 0747) Last BM Date:  (pta)  Intake/Output from previous day: 04/15 0701 - 04/16 0700 In: 25 [P.O.:25] Out: -  Intake/Output this shift: No intake/output data recorded.  General appearance: no distress Resp: clear to auscultation bilaterally Cardio: regular rate and rhythm GI: soft, NT Extremities: significant wasting  Collar on neck F/C  Lab Results: CBC  Recent Labs    10/17/20 0944 10/19/20 0245  WBC 13.7* 14.4*  HGB 9.5* 10.0*  HCT 30.5* 32.2*  PLT 446* 453*   BMET Recent Labs    10/17/20 0944 10/19/20 0245  NA 137 136  K 3.3* 3.7  CL 103 105  CO2 26 24  GLUCOSE 115* 87  BUN 7* 11  CREATININE 0.73 0.82  CALCIUM 8.9 9.0   PT/INR No results for input(s): LABPROT, INR in the last 72 hours. ABG No results for input(s): PHART, HCO3 in the last 72 hours.  Invalid input(s): PCO2, PO2  Studies/Results: No results found.  Anti-infectives: Anti-infectives (From admission, onward)   Start     Dose/Rate Route Frequency Ordered Stop   10/18/20 1000  amoxicillin-clavulanate (AUGMENTIN) 500-125 MG per tablet 500 mg        1 tablet Oral Every 12 hours 10/18/20 0828 10/28/20 0959   10/16/20 0945  ceFAZolin (ANCEF) IVPB 2g/100 mL premix        2 g 200 mL/hr over 30 Minutes Intravenous  Once 10/16/20 0931 10/16/20 1041      Assessment/Plan: Found down SAH-repeat CTH 4/13 stable, NS signed off, TBI therapies, keppra x1 week Possible MCA thrombus- noted on CT head, CTA negative, brain perfusion scannegative C2 fracture- collar present,  NSrecommends collar and OP follow up L tetrapod facial fracture- ENTDr. Wilburn Cornelia recommends non-op management, fracture precautions, nasal spray, ice, liquid/soft diet as tolerated, PO abx x10 days, follow up 1 week HTN-home meds. BP remains elevated, add IV metoprolol PRN GERD Hx of lung mass s/p resection 2019, Hx of renal mass/carcinoma - seen by Dr. Marin Olp, not currently candidate for immunotherapy CAD Anxiety, hx suicide attempt 2021- home psych meds  Dementia FEN:IVF, downgraded to D1 diet by SLP, Ensure, place cortrak today for nocturnal feeds  VTE: SCDs, start LMWH, 30mg  daily due to weight ID: Ancef x1, augmentin 4/14>>4/23 (x10 days per ENT), send UA as WBC 14 Dispo: Continue TBI team therapies, plan for SNF placement. Palliative on board. DNR, no artificial feeding, family considering comfort care.   LOS: 4 days    Georganna Skeans, MD, MPH, FACS Trauma & General Surgery Use [email or web address] to contact on call provider  10/20/2020

## 2020-10-20 NOTE — Progress Notes (Signed)
Dr. Grandville Silos notified patient agitated, trying to get out of bed, stating she wants to die and wants to kill her self. Also notified that she is not tolerating P.O' s.

## 2020-10-21 NOTE — Progress Notes (Signed)
   Trauma/Critical Care Follow Up Note  Subjective:    Overnight Issues:   Objective:  Vital signs for last 24 hours: Temp:  [97.6 F (36.4 C)-98.6 F (37 C)] 97.8 F (36.6 C) (04/17 0925) Pulse Rate:  [83-100] 83 (04/17 0900) Resp:  [19-28] 20 (04/17 0925) BP: (141-178)/(74-86) 147/76 (04/17 0925) SpO2:  [97 %-100 %] 99 % (04/17 0900)  Hemodynamic parameters for last 24 hours:    Intake/Output from previous day: No intake/output data recorded.  Intake/Output this shift: No intake/output data recorded.  Vent settings for last 24 hours:    Physical Exam:  Gen: comfortable, no distress Neuro: not f/c, responds to questions, albeit unintelligible HEENT: PERRL Neck: c-collar CV: RRR Pulm: unlabored breathing on RA Abd: soft, NT GU: clear yellow urine Extr: wwp, no edema   No results found for this or any previous visit (from the past 24 hour(s)).  Assessment & Plan:  Present on Admission: . ICH (intracerebral hemorrhage) ([place])    LOS: 5 days   Additional comments:I reviewed the patient's new clinical lab test results.   and I reviewed the patients new imaging test results.    Found down  SAH-repeat CTH4/13stable, NS signedoff, TBI therapies, keppra x1 week Possible MCA thrombus- noted on CT head, CTA negative, brain perfusion scannegative C2 fracture- collar present, NSrecommends collar and OP follow up L tetrapod facial fracture- ENTDr. Shoemakerrecommends non-op management,fracture precautions, nasal spray,ice, liquid/soft diet as tolerated, PO abx x10 days, follow up 1 week HTN-home meds. BP remains elevated, add IV metoprolol PRN GERD Hx of lung mass s/p resection2019, Hx of renal mass/carcinoma - seen by Dr. Marin Olp, not currently candidate for immunotherapy CAD Anxiety, hx suicide attempt 2021- home psych meds, safety sitter ordered as she is currently suicidal Dementia FEN:IVF, downgraded to D1 diet by SLP, Ensure, no artifical  feeding per family  VTE: SCDs, start LMWH, 30mg  daily due to weight ID: Ancef x1, augmentin 4/14>>4/23 (x10 days per ENT), send UA as WBC 14 Dispo:Continue TBI team therapies, plan for SNF placement. Palliative on board. DNR, no artificial feeding, family considering comfort care.   Jesusita Oka, MD Trauma & General Surgery Please use [email or web address] to contact on call provider  10/21/2020  *Care during the described time interval was provided by me. I have reviewed this patient's available data, including medical history, events of note, physical examination and test results as part of my evaluation.

## 2020-10-22 ENCOUNTER — Inpatient Hospital Stay (HOSPITAL_COMMUNITY): Payer: Medicare HMO

## 2020-10-22 DIAGNOSIS — E43 Unspecified severe protein-calorie malnutrition: Secondary | ICD-10-CM

## 2020-10-22 LAB — BASIC METABOLIC PANEL
Anion gap: 15 (ref 5–15)
BUN: 7 mg/dL — ABNORMAL LOW (ref 8–23)
CO2: 24 mmol/L (ref 22–32)
Calcium: 9.3 mg/dL (ref 8.9–10.3)
Chloride: 97 mmol/L — ABNORMAL LOW (ref 98–111)
Creatinine, Ser: 0.73 mg/dL (ref 0.44–1.00)
GFR, Estimated: >60 mL/min (ref >60)
Glucose, Bld: 105 mg/dL — ABNORMAL HIGH (ref 70–99)
Potassium: 3.3 mmol/L — ABNORMAL LOW (ref 3.5–5.1)
Sodium: 136 mmol/L (ref 135–145)

## 2020-10-22 LAB — CBC
HCT: 35.1 % — ABNORMAL LOW (ref 36.0–46.0)
Hemoglobin: 11.1 g/dL — ABNORMAL LOW (ref 12.0–15.0)
MCH: 26.7 pg (ref 26.0–34.0)
MCHC: 31.6 g/dL (ref 30.0–36.0)
MCV: 84.6 fL (ref 80.0–100.0)
Platelets: 461 10*3/uL — ABNORMAL HIGH (ref 150–400)
RBC: 4.15 MIL/uL (ref 3.87–5.11)
RDW: 16 % — ABNORMAL HIGH (ref 11.5–15.5)
WBC: 16.1 10*3/uL — ABNORMAL HIGH (ref 4.0–10.5)
nRBC: 0 % (ref 0.0–0.2)

## 2020-10-22 NOTE — Progress Notes (Signed)
    Progress Note from the Palliative Medicine Team at [facility]   Patient Name: Leslie Boyd       Date: 10/22/2020 DOB: 23-Jul-1945  Age: 75 y.o. MRN#: [id number] Attending Physician: Particia Jasper, MD Primary Care Physician: Nicola Girt, DO Admit Date: 10/16/2020   Medical records reviewed   75 y.o. female   admitted on 10/16/2020 with  after being found down, presenting to [facility] with SAH, C2 fx, facial fx, and concern for M2/3 thrombus. Patient emergently transported as L1A for possible neuro IR intervention. Discussed case with both NSGY (Dr. Zada Finders) and Neuro IR (Dr. Estanislado Pandy) and plan for CT perfusion scan at presentation. Patient presented confused, but moves all extremities symmetrically, and f/c. CTA obtained prior to transport negative for thrombus.CT perfusion scan negative. Currently in  ICU for close neuro monitoring.  Today is day 6 of this hospitalization, she is more alert today.  She knows her name and location, she is intermittantly confused   She po intake is improving   This NP visited patient at the bedside as a follow up for palliative medicien needs and emotional support.  I spoke initially this morning with daughter Leslie Racer by phone and scheduled a conference call for this afternoon with both her and her brother for further clarification of goals of care.  I had a conference call with daughter/ Leslie Boyd  and son/ Leslie Boyd Continue conversation regarding current medical situation; diagnosis, prognosis, goals of care, end-of-life wishes, disposition and options.  Family face treatment option decisions, advanced directive decisions and anticipatory care needs.  Plan of Care: -DNR/DNI -no artifical artifical feeding now or in the future  -NO invasive life prolonging interventions; no central lines, surgery, chemo.  ALL INTERVENTIONS TO BE CLEARED WITH HPOA. -continue with current medical interventions and when stable for discharge family is open to  SNF for rehab  Discussed with family the importance of continued conversation with each other and their  medical providers regarding overall plan of care and treatment options,  ensuring decisions are within the context of the patients values and GOCs.  Questions and concerns addressed   Discussed with bedside RN Melissa   Total time spent on the unit was 60 minutes  Greater than 50% of the time was spent in counseling and coordination of care  Wadie Lessen NP  Palliative Medicine Team Team Phone # [phone] Pager [phone]

## 2020-10-22 NOTE — Progress Notes (Signed)
Physical Therapy Treatment Patient Details Name: Leslie Boyd MRN: [id number] DOB: 1946/03/21 Today's Date: 10/22/2020    History of Present Illness This 75 y.o. female presented 4/12 after being found on floor by her spouse.  She was found to have traumatic SAH, and punctate ICH; C2 fracture, and facial fx.  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  PMH includes: new diagnosis metastatic renal cancer, h/o lung CA, Anxiety, osteopenia, cognitive dysfunction with behavioral changes.    PT Comments    RN and pt husband reporting pt with improved cognition today. Pt alert and oriented to time and place; although still remains lethargic with flat affect. Requiring min assist for bed mobility and transfers. Continued poor sitting balance with right lateral lean and decreased RLE proprioception, thus causing erratic placement with transfers. Continue to recommend SNF for ongoing Physical Therapy.       Follow Up Recommendations  SNF;Supervision/Assistance - 24 hour     Equipment Recommendations  Rolling walker with 5" wheels;3in1 (PT)    Recommendations for Other Services       Precautions / Restrictions Precautions Precautions: Fall;Cervical Precaution Booklet Issued: No Required Braces or Orthoses: Cervical Brace Cervical Brace: Hard collar;At all times Restrictions Weight Bearing Restrictions: No    Mobility  Bed Mobility Overal bed mobility: Needs Assistance Bed Mobility: Supine to Sit     Supine to sit: Min assist     General bed mobility comments: Pt initiating well, minA for trunk to upright, use of bed pad to scoot hips to edge    Transfers Overall transfer level: Needs assistance Equipment used: 2 person hand held assist Transfers: Stand Pivot Transfers;Sit to/from Stand Sit to Stand: Min assist;+2 physical assistance Stand pivot transfers: Min assist;+2 physical assistance       General transfer comment: MinA + 2 to rise x 2 from edge of  bed and pivot towards left to recliner. Pt with poor spatial awareness of RLE, with erratic step and placement  Ambulation/Gait                 Stairs             Wheelchair Mobility    Modified Rankin (Stroke Patients Only) Modified Rankin (Stroke Patients Only) Pre-Morbid Rankin Score: No symptoms Modified Rankin: Moderately severe disability     Balance Overall balance assessment: Needs assistance Sitting-balance support: Bilateral upper extremity supported;Single extremity supported Sitting balance-Leahy Scale: Poor Sitting balance - Comments: Pt requiring min-modA, right lateral lean Postural control: Right lateral lean Standing balance support: Bilateral upper extremity supported Standing balance-Leahy Scale: Poor Standing balance comment: reliant on bilateral HHA                            Cognition Arousal/Alertness: Lethargic Behavior During Therapy: Flat affect Overall Cognitive Status: Impaired/Different from baseline Area of Impairment: Memory;Following commands;Safety/judgement;Awareness;Problem solving;[place] level;Orientation                     Memory: Decreased short-term memory;Decreased recall of precautions Following Commands: Follows one step commands inconsistently;Follows one step commands with increased time Safety/Judgement: Decreased awareness of deficits;Decreased awareness of safety Awareness: Intellectual Problem Solving: Slow processing;Decreased initiation;Difficulty sequencing;Requires verbal cues;Requires tactile cues General Comments: Pt keeping eyes closed throughout majority of session; demonstrates right lateral lean in sitting without awareness. Able to correct with multimodal cues but not maintain. Oriented to place and time today. Stating she has been married  24 years, but husband reports 64. Pt husband reports improvement in cognition today, with pt stating she wanted to try to eat.      Exercises       General Comments        Pertinent Vitals/Pain Pain Assessment: Faces Faces Pain Scale: No hurt    Home Living                      Prior Function            PT Goals (current goals can now be found in the care plan section) Acute Rehab PT Goals Patient Stated Goal: did not state Potential to Achieve Goals: Fair Progress towards PT goals: Progressing toward goals    Frequency    Min 3X/week      PT Plan Current plan remains appropriate    Co-evaluation              AM-PAC PT "6 Clicks" Mobility   Outcome Measure  Help needed turning from your back to your side while in a flat bed without using bedrails?: A Little Help needed moving from lying on your back to sitting on the side of a flat bed without using bedrails?: A Little Help needed moving to and from a bed to a chair (including a wheelchair)?: A Little Help needed standing up from a chair using your arms (e.g., wheelchair or bedside chair)?: A Little Help needed to walk in hospital room?: A Lot Help needed climbing 3-5 steps with a railing? : Total 6 Click Score: 15    End of Session Equipment Utilized During Treatment: Gait belt;Cervical collar Activity Tolerance: Patient tolerated treatment well Patient left: with call bell/phone within reach;with restraints reapplied;in chair;with chair alarm set Nurse Communication: Mobility status PT Visit Diagnosis: Unsteadiness on feet (R26.81);Other abnormalities of gait and mobility (R26.89);Muscle weakness (generalized) (M62.81);History of falling (Z91.81);Difficulty in walking, not elsewhere classified (R26.2)     Time: 0017-4944 PT Time Calculation (min) (ACUTE ONLY): 20 min  Charges:  $Therapeutic Activity: 8-22 mins                     Wyona Almas, PT, DPT Acute Rehabilitation Services Pager [phone] Office [phone]    Deno Etienne 10/22/2020, 3:32 PM

## 2020-10-22 NOTE — Progress Notes (Signed)
Progress Note     Subjective: Patient alert and oriented this AM. Denies pain this AM. Denies needs currently.   Objective: Vital signs in last 24 hours: Temp:  [97.4 F (36.3 C)-97.9 F (36.6 C)] 97.4 F (36.3 C) (04/18 0700) Pulse Rate:  [89-91] 91 (04/17 2015) Resp:  [18-32] 25 (04/18 0413) BP: (146-187)/(76-97) 187/80 (04/18 0700) SpO2:  [96 %-100 %] 96 % (04/18 0700) Last BM Date:  (pta)  Intake/Output from previous day: 04/17 0701 - 04/18 0700 In: 5019.8 [P.O.:100; I.V.:4919.8] Out: -  Intake/Output this shift: No intake/output data recorded.  PE: General: pleasant, WD, cachectic female who is laying in bed in NAD HEENT: ecchymosis of right face  Neck: hard collar present  Heart: regular, rate, and rhythm.   Lungs: CTAB, no wheezes, rhonchi, or rales noted.  Respiratory effort nonlabored Abd: soft, NT, ND, +BS, no masses, hernias, or organomegaly MS: all 4 extremities are symmetrical with no cyanosis, clubbing, or edema. Skin: warm and dry with no masses, lesions, or rashes Neuro: Cranial nerves 2-12 grossly intact, sensation is normal throughout Psych: A&Ox3 with an appropriate affect.    Lab Results:  No results for input(s): WBC, HGB, HCT, PLT in the last 72 hours. BMET No results for input(s): NA, K, CL, CO2, GLUCOSE, BUN, CREATININE, CALCIUM in the last 72 hours. PT/INR No results for input(s): LABPROT, INR in the last 72 hours. CMP     Component Value Date/Time   NA 136 10/19/2020 0245   K 3.7 10/19/2020 0245   CL 105 10/19/2020 0245   CO2 24 10/19/2020 0245   GLUCOSE 87 10/19/2020 0245   BUN 11 10/19/2020 0245   CREATININE 0.82 10/19/2020 0245   CREATININE 0.97 08/30/2020 1448   CALCIUM 9.0 10/19/2020 0245   PROT 7.6 10/16/2020 0601   ALBUMIN 3.7 10/16/2020 0601   AST 22 10/16/2020 0601   AST 14 (L) 08/30/2020 1448   ALT 14 10/16/2020 0601   ALT 11 08/30/2020 1448   ALKPHOS 77 10/16/2020 0601   BILITOT 0.4 10/16/2020 0601   BILITOT 0.2  (L) 08/30/2020 1448   GFRNONAA >60 10/19/2020 0245   GFRNONAA >60 08/30/2020 1448   GFRAA 55 (L) 12/22/2019 1132   Lipase     Component Value Date/Time   LIPASE 23 09/29/2020 1118       Studies/Results: No results found.  Anti-infectives: Anti-infectives (From admission, onward)   Start     Dose/Rate Route Frequency Ordered Stop   10/18/20 1000  amoxicillin-clavulanate (AUGMENTIN) 500-125 MG per tablet 500 mg        1 tablet Oral Every 12 hours 10/18/20 0828 10/28/20 0959   10/16/20 0945  ceFAZolin (ANCEF) IVPB 2g/100 mL premix        2 g 200 mL/hr over 30 Minutes Intravenous  Once 10/16/20 0931 10/16/20 1041       Assessment/Plan Found down  SAH-repeat CTH4/13stable, NS signedoff, TBI therapies, keppra x1 week Possible MCA thrombus- noted on CT head, CTA negative, brain perfusion scannegative C2 fracture- collar present, NSrecommends collar and OP follow up L tetrapod facial fracture- ENTDr. Shoemakerrecommends non-op management,fracture precautions, nasal spray,ice, liquid/soft diet as tolerated, PO abx x10 days, follow up 1 week HTN-home meds. BP remains elevated, add IV metoprolol PRN GERD Hx of lung mass s/p resection2019, Hx of renal mass/carcinoma - seen by Dr. Marin Olp, not currently candidate for immunotherapy CAD Anxiety, hx suicide attempt 2021- home psych meds, safety sitter ordered as she is currently suicidal Dementia  FEN:IVF,  downgraded to D1 diet by SLP, Ensure, no artifical feeding per family  VTE: SCDs, start LMWH, 30mg  daily due to weight ID: Ancef x1, augmentin 4/14>>4/23 (x10 days per ENT), repeat CBC this AM  Dispo:Palliative following up today. Continue current interventions other than artificial feeding for now. Transfer to floor. DNR  LOS: 6 days    Norm Parcel, [facility] 10/22/2020, 11:05 AM Please see Amion for pager number during day hours 7:00am-4:30pm

## 2020-10-23 DIAGNOSIS — R531 Weakness: Secondary | ICD-10-CM

## 2020-10-23 LAB — CBC
HCT: 37.1 % (ref 36.0–46.0)
Hemoglobin: 11.2 g/dL — ABNORMAL LOW (ref 12.0–15.0)
MCH: 26 pg (ref 26.0–34.0)
MCHC: 30.2 g/dL (ref 30.0–36.0)
MCV: 86.3 fL (ref 80.0–100.0)
Platelets: 530 10*3/uL — ABNORMAL HIGH (ref 150–400)
RBC: 4.3 MIL/uL (ref 3.87–5.11)
RDW: 16 % — ABNORMAL HIGH (ref 11.5–15.5)
WBC: 14.8 10*3/uL — ABNORMAL HIGH (ref 4.0–10.5)
nRBC: 0 % (ref 0.0–0.2)

## 2020-10-23 LAB — URINALYSIS, ROUTINE W REFLEX MICROSCOPIC
Bacteria, UA: NONE SEEN
Glucose, UA: NEGATIVE mg/dL
Hgb urine dipstick: NEGATIVE
Ketones, ur: 80 mg/dL — AB
Leukocytes,Ua: NEGATIVE
Nitrite: NEGATIVE
Protein, ur: 30 mg/dL — AB
Specific Gravity, Urine: 1.019 (ref 1.005–1.030)
pH: 5 (ref 5.0–8.0)

## 2020-10-23 NOTE — TOC Progression Note (Signed)
Transition of Care [facility]) - Progression Note    Patient Details  Name: KESHAWN SUNDBERG MRN: [id number] Date of Birth: [birth year]  Transition of Care [facility]) CM/SW Contact  Oren Section Cleta Alberts, RN Phone Number: 10/23/2020, 2:01 PM  Clinical Narrative:   Bed offers given to patient's daughter, Chrys Racer. She would like me to check with [facility] in [place], as pt's husband as been there before.  Called [facility] admissions (spoke with [facility]); she will review case and call me back.     Expected Discharge Plan: [place] Barriers to Discharge: Continued Medical Work up  Expected Discharge Plan and Services Expected Discharge Plan: [place]   Discharge Planning Services: CM Consult   Living arrangements for the past 2 months: Single Family Home                                       Social Determinants of Health (SDOH) Interventions    Readmission Risk Interventions No flowsheet data found.  Reinaldo Raddle, RN, BSN  Trauma/Neuro ICU Case Manager [phone]

## 2020-10-23 NOTE — Progress Notes (Signed)
    Progress Note from the Palliative Medicine Team at [facility]   Patient Name: Leslie Boyd       Date: 10/23/2020 DOB: May 25, 1946  Age: 75 y.o. MRN#: [id number] Attending Physician: Particia Jasper, MD Primary Care Physician: Nicola Girt, DO Admit Date: 10/16/2020   Medical records reviewed   75 y.o. female   admitted on 10/16/2020 with  after being found down, presenting to [facility] with SAH, C2 fx, facial fx, and concern for M2/3 thrombus. Patient emergently transported as L1A for possible neuro IR intervention. Discussed case with both NSGY (Dr. Zada Finders) and Neuro IR (Dr. Estanislado Pandy) and plan for CT perfusion scan at presentation. Patient presented confused, but moves all extremities symmetrically, and f/c. CTA obtained prior to transport negative for thrombus.CT perfusion scan negative. Currently in  ICU for close neuro monitoring.  Today is day 7 of this hospitalization, today she is oriented to person and place.  She knows her name and location, she remains intermittantly confused   Her po intake is improving.   This NP visited patient at the bedside as a follow up for palliative medicien needs and emotional support.  I spoke with daughter Chrys Racer by phone for brief update on current medical situation.   Family are comfortable with plan of care as follows:   Plan of Care: -DNR/DNI -no artifical artifical feeding now or in the future  -NO invasive life prolonging interventions; no central lines, surgery, chemo.  ALL INTERVENTIONS TO BE CLEARED WITH HPOA. -continue with current medical interventions and when stable for discharge family is open to SNF for rehab.   Hopeful to find facility close to where  Family live in [facility]  Questions and concerns addressed     Total time spent on the unit was 15  minutes  Greater than 50% of the time was spent in counseling and coordination of care  Wadie Lessen NP  Palliative Medicine Team Team Phone # [phone] Pager  [phone]

## 2020-10-23 NOTE — Progress Notes (Signed)
Progress Note     Subjective: CC: alert and oriented to self. Confused in regard to situation. She denies pain or needs.   Objective: Vital signs in last 24 hours: Temp:  [97.8 F (36.6 C)-98.1 F (36.7 C)] 98.1 F (36.7 C) (04/19 0500) Pulse Rate:  [84-98] 85 (04/19 0500) Resp:  [18-24] 18 (04/19 0500) BP: (152-178)/(70-88) 178/88 (04/19 0500) SpO2:  [97 %-100 %] 99 % (04/19 0500) Last BM Date:  (PTA)  Intake/Output from previous day: 04/18 0701 - 04/19 0700 In: 120 [P.O.:120] Out: -  Intake/Output this shift: No intake/output data recorded.  PE: General: pleasant, cachetic female who is laying in bed in NAD HEENT: ecchymosis of right face. Heart: regular, rate, and rhythm.  Normal s1,s2. No obvious murmurs, gallops, or rubs noted.  Palpable radial pulses bilaterally Lungs: CTAB, no wheezes, rhonchi, or rales noted.  Respiratory effort nonlabored Abd: soft, NT, ND, +BS MS: all 4 extremities are symmetrical with no cyanosis, clubbing, or edema. Skin: warm and dry Neuro: Cranial nerves 2-12 grossly intact, sensation is normal throughout Psych: alert with an appropriate affect.    Lab Results:  Recent Labs    10/22/20 1129  WBC 16.1*  HGB 11.1*  HCT 35.1*  PLT 461*   BMET Recent Labs    10/22/20 1129  NA 136  K 3.3*  CL 97*  CO2 24  GLUCOSE 105*  BUN 7*  CREATININE 0.73  CALCIUM 9.3   PT/INR No results for input(s): LABPROT, INR in the last 72 hours. CMP     Component Value Date/Time   NA 136 10/22/2020 1129   K 3.3 (L) 10/22/2020 1129   CL 97 (L) 10/22/2020 1129   CO2 24 10/22/2020 1129   GLUCOSE 105 (H) 10/22/2020 1129   BUN 7 (L) 10/22/2020 1129   CREATININE 0.73 10/22/2020 1129   CREATININE 0.97 08/30/2020 1448   CALCIUM 9.3 10/22/2020 1129   PROT 7.6 10/16/2020 0601   ALBUMIN 3.7 10/16/2020 0601   AST 22 10/16/2020 0601   AST 14 (L) 08/30/2020 1448   ALT 14 10/16/2020 0601   ALT 11 08/30/2020 1448   ALKPHOS 77 10/16/2020 0601    BILITOT 0.4 10/16/2020 0601   BILITOT 0.2 (L) 08/30/2020 1448   GFRNONAA >60 10/22/2020 1129   GFRNONAA >60 08/30/2020 1448   GFRAA 55 (L) 12/22/2019 1132   Lipase     Component Value Date/Time   LIPASE 23 09/29/2020 1118      Studies/Results: DG CHEST PORT 1 VIEW  Result Date: 10/22/2020 CLINICAL DATA:  Leukocytosis. EXAM: PORTABLE CHEST 1 VIEW COMPARISON:  Chest x-ray dated October 16, 2020. FINDINGS: The patient is significantly rotated to the right, limiting evaluation. Stable cardiomediastinal silhouette. Postsurgical changes in the medial left upper lobe again noted. The lungs remain hyperinflated No focal consolidation, pleural effusion, or pneumothorax. No acute osseous abnormality. IMPRESSION: 1. Limited study due to rotation.  No active disease. 2. COPD. Electronically Signed   By: Titus Dubin M.D.   On: 10/22/2020 14:12    Anti-infectives: Anti-infectives (From admission, onward)   Start     Dose/Rate Route Frequency Ordered Stop   10/18/20 1000  amoxicillin-clavulanate (AUGMENTIN) 500-125 MG per tablet 500 mg        1 tablet Oral Every 12 hours 10/18/20 0828 10/28/20 0959   10/16/20 0945  ceFAZolin (ANCEF) IVPB 2g/100 mL premix        2 g 200 mL/hr over 30 Minutes Intravenous  Once 10/16/20 0931 10/16/20  1041       Assessment/Plan Found down  SAH-repeat CTH4/13stable, NS signedoff, TBI therapies, keppra x1 week Possible MCA thrombus- noted on CT head, CTA negative, brain perfusion scannegative C2 fracture- collar present, NSrecommends collar and OP follow up L tetrapod facial fracture- ENTDr. Shoemakerrecommends non-op management,fracture precautions, nasal spray,ice, liquid/soft diet as tolerated, PO abx x10 days, checked in with ENT today - follow up outpatient HTN-home meds. BP remains elevated, hasIV metoprolol PRN GERD Hx of lung mass s/p resection2019, Hx of renal mass/carcinoma - seen by Dr. Marin Olp, not currently candidate for  immunotherapy CAD Anxiety, hx suicide attempt 2021- home psych meds, safety sitter ordered as she is currently suicidal Dementia  FEN:IVF, downgraded to D1 diet by SLP, Ensure,no artifical feeding per family VTE: SCDs, start LMWH, 30mg  daily due to weight ID: Ancef x1, augmentin 4/14>>4/23 (x10 days per ENT), CBC yesterday 16.1 will recheck this am. CXR 4/18 - COPD, no active disease. Will get UA  Dispo:Palliative following - all interventions to be cleared with HPOA. Continue current interventions other than artificial feeding for now. DNR. Family preference for possible discharge to SNF for rehab   LOS: 7 days    Winferd Humphrey, [facility] 10/23/2020, 9:41 AM Please see Amion for pager number during day hours 7:00am-4:30pm

## 2020-10-24 MED ORDER — HALOPERIDOL LACTATE 5 MG/ML IJ SOLN
5.0000 mg | Freq: Once | INTRAMUSCULAR | Status: AC
  Administered 2020-10-24: 5 mg via INTRAVENOUS
  Filled 2020-10-24: qty 1

## 2020-10-24 MED ORDER — METHOCARBAMOL 1000 MG/10ML IJ SOLN
500.0000 mg | Freq: Four times a day (QID) | INTRAVENOUS | Status: DC | PRN
  Administered 2020-10-25: 500 mg via INTRAVENOUS
  Filled 2020-10-24 (×2): qty 5

## 2020-10-24 MED ORDER — LORAZEPAM 2 MG/ML IJ SOLN
0.5000 mg | INTRAMUSCULAR | Status: DC | PRN
  Administered 2020-10-25 (×2): 0.5 mg via INTRAVENOUS
  Filled 2020-10-24 (×2): qty 1

## 2020-10-24 MED ORDER — HALOPERIDOL LACTATE 5 MG/ML IJ SOLN
2.0000 mg | Freq: Four times a day (QID) | INTRAMUSCULAR | Status: DC | PRN
  Administered 2020-10-25 (×2): 2 mg via INTRAVENOUS
  Filled 2020-10-24 (×2): qty 1

## 2020-10-24 NOTE — Care Management Important Message (Signed)
Important Message  Patient Details  Name: Leslie Boyd MRN: [id number] Date of Birth: 1945/10/25   Medicare Important Message Given:  Yes     Orbie Pyo 10/24/2020, 3:07 PM

## 2020-10-24 NOTE — Progress Notes (Signed)
Pt given Ativan at 1956 for Anxiety and pulling at lines and C-collar.  Soft mitts were placed to assist with this.  Pt removed soft mitts and continues to try to pull and remove C-collar.

## 2020-10-24 NOTE — Progress Notes (Signed)
Progress Note     Subjective: CC: very sleep this morning. Oriented to self and place. She denies pain or other complaints   Objective: Vital signs in last 24 hours: Temp:  [98 F (36.7 C)-98.8 F (37.1 C)] 98.8 F (37.1 C) (04/20 0443) Pulse Rate:  [92-98] 98 (04/20 0443) Resp:  [17-18] 17 (04/20 0443) BP: (141-158)/(74-86) 158/86 (04/20 0443) SpO2:  [97 %-98 %] 97 % (04/20 0443) Last BM Date:  (PTA)  Intake/Output from previous day: 04/19 0701 - 04/20 0700 In: 170 [P.O.:170] Out: 100 [Urine:100] Intake/Output this shift: Total I/O In: -  Out: 150 [Urine:150]  PE: General: pleasant, cachetic female who is laying in bed in NAD HEENT: Collar in place. Ecchymosis of face  Heart:  Palpable radial and pedal pulses bilaterally Lungs: CTAB.  Respiratory effort nonlabored Abd: soft, NT, ND, +BS, no masses, hernias, or organomegaly MS: all 4 extremities are symmetrical with no cyanosis, clubbing, or edema. Skin: warm and dry with no masses, lesions, or rashes Neuro: Cranial nerves 2-12 grossly intact, sensation is normal throughout Psych: alert with an appropriate affect   Lab Results:  Recent Labs    10/22/20 1129 10/23/20 1034  WBC 16.1* 14.8*  HGB 11.1* 11.2*  HCT 35.1* 37.1  PLT 461* 530*   BMET Recent Labs    10/22/20 1129  NA 136  K 3.3*  CL 97*  CO2 24  GLUCOSE 105*  BUN 7*  CREATININE 0.73  CALCIUM 9.3   PT/INR No results for input(s): LABPROT, INR in the last 72 hours. CMP     Component Value Date/Time   NA 136 10/22/2020 1129   K 3.3 (L) 10/22/2020 1129   CL 97 (L) 10/22/2020 1129   CO2 24 10/22/2020 1129   GLUCOSE 105 (H) 10/22/2020 1129   BUN 7 (L) 10/22/2020 1129   CREATININE 0.73 10/22/2020 1129   CREATININE 0.97 08/30/2020 1448   CALCIUM 9.3 10/22/2020 1129   PROT 7.6 10/16/2020 0601   ALBUMIN 3.7 10/16/2020 0601   AST 22 10/16/2020 0601   AST 14 (L) 08/30/2020 1448   ALT 14 10/16/2020 0601   ALT 11 08/30/2020 1448    ALKPHOS 77 10/16/2020 0601   BILITOT 0.4 10/16/2020 0601   BILITOT 0.2 (L) 08/30/2020 1448   GFRNONAA >60 10/22/2020 1129   GFRNONAA >60 08/30/2020 1448   GFRAA 55 (L) 12/22/2019 1132   Lipase     Component Value Date/Time   LIPASE 23 09/29/2020 1118       Studies/Results: DG CHEST PORT 1 VIEW  Result Date: 10/22/2020 CLINICAL DATA:  Leukocytosis. EXAM: PORTABLE CHEST 1 VIEW COMPARISON:  Chest x-ray dated October 16, 2020. FINDINGS: The patient is significantly rotated to the right, limiting evaluation. Stable cardiomediastinal silhouette. Postsurgical changes in the medial left upper lobe again noted. The lungs remain hyperinflated No focal consolidation, pleural effusion, or pneumothorax. No acute osseous abnormality. IMPRESSION: 1. Limited study due to rotation.  No active disease. 2. COPD. Electronically Signed   By: Titus Dubin M.D.   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 Shoemakerrecommends non-op management,fracture precautions, nasal spray,ice, liquid/soft diet as tolerated, PO abx x10 days, checked in with ENT today - follow up outpatient HTN-home meds. 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 Marin Olp, not  currently candidate for immunotherapy CAD Anxiety, hx suicide attempt 2021- home psych meds, safety sitter ordered as she is currently suicidal Dementia  FEN:IVF, downgraded to D1 diet by SLP, Ensure,no artifical feeding per family VTE: SCDs, LMWH, 30mg  daily due to weight ID: Ancef x1, augmentin 4/14>>4/23 (x10 days per ENT),CBC yesterday 16.1 will recheck this am. CXR 4/18 - COPD, no active disease. UA not concerning for infection  Dispo:Palliative following - all interventions to be cleared with HPOA. Continue current interventions other than artificial feeding for now. DNR. Family preference for possible discharge to SNF for rehab    LOS: 8 days    Winferd Humphrey, [facility] Surgery 10/24/2020, 9:14 AM Please see Amion for pager number during day hours 7:00am-4:30pm

## 2020-10-24 NOTE — Progress Notes (Signed)
On-call provider, Byerly MD, notified of pt attempting to remove IV, C-Collar and get up out of bed.  Pt not redirectable by voice alone, not following commands at this time.  Impulsive.  Order received and placed for 1:1 safety sitter.

## 2020-10-24 NOTE — Progress Notes (Signed)
Nutrition Follow-up  DOCUMENTATION CODES:   Severe malnutrition in context of chronic illness,Underweight  INTERVENTION:   -Continue Ensure Enlive po BID, each supplement provides 350 kcal and 20 grams of protein -Continue Magic cup TID with meals, each supplement provides 290 kcal and 9 grams of protein -Continue feeding assistance with meals  NUTRITION DIAGNOSIS:   Severe Malnutrition related to chronic illness as evidenced by severe muscle depletion,severe fat depletion.  Ongoing  GOAL:   Patient will meet greater than or equal to 90% of their needs  Unmet  MONITOR:   PO intake,Supplement acceptance  REASON FOR ASSESSMENT:   Consult Calorie Count  ASSESSMENT:   Pt with PMH of current smoker, LUL lung mass s/p L wedge resection 2019, L renal mass, GERD, HTN, and osteopenia who was found down at home by husband admitted with SAH, C2 fx, orbital fx with significant orbital content displacement.  Reviewed I/O's: +70 ml x 24 hours and +10.6 L since admission  UOP: 100 ml x 24 hours  Per palliative care notes, pt DNR/DNI with no plans for artificial feeding. Also no invasive life prolonging procedures. Family desires to continue with current medical interventions and discharge to SNF once medically stable.   Pt remains with poor oral intake. Noted meal completions 0-25%. She is intermittently accepting of Ensure Enlive supplements.   Medications reviewed and include remeron.  Labs reviewed: K: 3.3.   Diet Order:   Diet Order            DIET - DYS 1 Room service appropriate? No; Fluid consistency: Thin  Diet effective now                 EDUCATION NEEDS:   Not appropriate for education at this time  Skin:  Skin Assessment: Skin Integrity Issues: Skin Integrity Issues:: Incisions Incisions: closed preineum incision  Last BM:  Unknown  Height:   Ht Readings from Last 1 Encounters:  10/16/20 4\' 11"  (1.499 m)    Weight:   Wt Readings from Last 1  Encounters:  10/16/20 32.3 kg    Ideal Body Weight:  44.6 kg  BMI:  Body mass index is 14.38 kg/m.  Estimated Nutritional Needs:   Kcal:  1200-1400  Protein:  55-65 grams  Fluid:  >1.5 L/day    Loistine Chance, RD, LDN, [place] Registered Dietitian II Certified Diabetes Care and Education Specialist Please refer to [facility] for RD and/or RD on-call/weekend/after hours pager

## 2020-10-24 NOTE — Progress Notes (Signed)
    Progress Note from the Palliative Medicine Team at [facility]   Patient Name: Leslie Boyd       Date: 10/24/2020 DOB: 05-Aug-1945  Age: 75 y.o. MRN#: [id number] Attending Physician: Particia Jasper, MD Primary Care Physician: Nicola Girt, DO Admit Date: 10/16/2020   Medical records reviewed   75 y.o.femaleadmitted on 4/12/2022with  after being found down, presenting to [facility] with SAH, C2 fx, facial fx, and concern for M2/3 thrombus. Patient emergently transported as L1A for possible neuro IR intervention. Discussed case with both NSGY (Dr. Zada Finders) and Neuro IR (Dr. Estanislado Pandy) and plan for CT perfusion scan at presentation. Patient presented confused, but moves all extremities symmetrically, and f/c. CTA obtained prior to transport negative for thrombus.CT perfusion scan negative.Currently inICU for close neuro monitoring.  This NP visited patient at the bedside as a follow up to  yesterday's [place].  Today is day 8 of this hospitalization, today she is oriented to person only .   location, she is intermittantly confused   Her po intake is limited to sips and bites  Spoke with daughter/ Hoyle Sauer Rodden/HPOA.  Continued conversation regarding current medical situation; diagnosis, prognosis, GOCs, EOL wishes disposition and options.   Family have come to realize that in spite of current medical interventions patient continued to fail to thrive.  Focus of care is comfort, quality and dignity.     Education offered on the difference between a continued medical intervention path verses a palliative comfort path for this patient at this time in this situation.    Plan of Care:    Comfort, quality and dignity-allow for natural death -DNR/DNI -No artificial feeding or hydration now or in the future/diet as tolerated -No further work-up for underlying cancer diagnosis -No further diagnostics; lab draws, x-rays -Minimize medications, no further IV antibiotic use -Symptom  management - Re-evaluate in next 24-48 hours for residential hospice   Education offered on the natural trajectory and expectations at EOL.  PMT will continue to support holitically  Questions and concerns addressed   Discussed with trauma team and bedside RN  Total time spent on the unit was 45 minutes  Greater than 50% of the time was spent in counseling and coordination of care  Wadie Lessen NP  Palliative Medicine Team Team Phone # [phone] Pager [phone]

## 2020-10-24 NOTE — Progress Notes (Signed)
Arrived on the unit to assess pt for a PIV. Pt currently has 1 PIV. Consult cleared and RN aware.

## 2020-10-24 NOTE — TOC Progression Note (Signed)
Transition of Care [facility]) - Progression Note    Patient Details  Name: Leslie Boyd MRN: [id number] Date of Birth: 1946/07/03  Transition of Care [facility]) CM/SW [place], [place] Phone Number: 10/24/2020, 3:56 PM  Clinical Narrative:     CSW spoke to pts daughter Chrys Racer on phone. Chrys Racer stated her and the family live in [place] and they would like her close to family. [place] and gave referral.   Expected Discharge Plan: [place] Barriers to Discharge: Continued Medical Work up  Expected Discharge Plan and Services Expected Discharge Plan: [place]   Discharge Planning Services: CM Consult   Living arrangements for the past 2 months: Single Family Home                                       Social Determinants of Health (SDOH) Interventions    Readmission Risk Interventions No flowsheet data found.  Emeterio Reeve, Latanya Presser, [place] Social Worker [phone]

## 2020-10-25 ENCOUNTER — Encounter: Payer: Self-pay | Admitting: *Deleted

## 2020-10-25 DIAGNOSIS — R451 Restlessness and agitation: Secondary | ICD-10-CM

## 2020-10-25 NOTE — Progress Notes (Addendum)
Progress Note     Subjective: CC: Sleepy. She arouses easily. She states "I'm okay."   Objective: Vital signs in last 24 hours: Temp:  [97.6 F (36.4 C)-98.6 F (37 C)] 97.6 F (36.4 C) (04/21 1950) Pulse Rate:  [91-92] 92 (04/21 0638) Resp:  [17-18] 17 (04/21 9326) BP: (148-172)/(70-84) 148/70 (04/21 0638) SpO2:  [92 %-97 %] 92 % (04/21 7124) Last BM Date:  (PTA)  Intake/Output from previous day: 04/20 0701 - 04/21 0700 In: 231.9 [P.O.:180; IV Piggyback:51.9] Out: 250 [Urine:250] Intake/Output this shift: No intake/output data recorded.  PE: General: cachetic female who is laying in bed in NAD. Safety sitter at bedside HEENT: Collar in place. Ecchymosis of face  Heart:  Palpable radial and pedal pulses bilaterally Lungs: CTAB.  Respiratory effort nonlabored Abd: soft, NT, ND, +BS, no masses, hernias, or organomegaly MS: all 4 extremities are symmetrical with no cyanosis, clubbing, or edema. Skin: warm and dry  Psych: very drowsy   Lab Results:  Recent Labs    10/22/20 1129 10/23/20 1034  WBC 16.1* 14.8*  HGB 11.1* 11.2*  HCT 35.1* 37.1  PLT 461* 530*   BMET Recent Labs    10/22/20 1129  NA 136  K 3.3*  CL 97*  CO2 24  GLUCOSE 105*  BUN 7*  CREATININE 0.73  CALCIUM 9.3   PT/INR No results for input(s): LABPROT, INR in the last 72 hours. CMP     Component Value Date/Time   NA 136 10/22/2020 1129   K 3.3 (L) 10/22/2020 1129   CL 97 (L) 10/22/2020 1129   CO2 24 10/22/2020 1129   GLUCOSE 105 (H) 10/22/2020 1129   BUN 7 (L) 10/22/2020 1129   CREATININE 0.73 10/22/2020 1129   CREATININE 0.97 08/30/2020 1448   CALCIUM 9.3 10/22/2020 1129   PROT 7.6 10/16/2020 0601   ALBUMIN 3.7 10/16/2020 0601   AST 22 10/16/2020 0601   AST 14 (L) 08/30/2020 1448   ALT 14 10/16/2020 0601   ALT 11 08/30/2020 1448   ALKPHOS 77 10/16/2020 0601   BILITOT 0.4 10/16/2020 0601   BILITOT 0.2 (L) 08/30/2020 1448   GFRNONAA >60 10/22/2020 1129   GFRNONAA >60  08/30/2020 1448   GFRAA 55 (L) 12/22/2019 1132   Lipase     Component Value Date/Time   LIPASE 23 09/29/2020 1118       Studies/Results: No results found.  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 Shoemakerrecommends non-op management,fracture precautions, nasal spray,ice, liquid/soft diet as tolerated, PO abx x10 days, checked in with ENT - follow up outpatient HTN-home meds. 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 Marin Olp, not currently candidate for immunotherapy CAD Anxiety, hx suicide attempt 2021-  home psych meds, safety sitter ordered as she is currently suicidal Dementia  FEN:IVF, downgraded to D1 diet by SLP, Ensure,no artifical feeding per family VTE: SCDs, LMWH, 30mg  daily due to weight ID: Ancef x1, augmentin 4/14>>4/23 (x10 days per ENT),Leukocytosis improved. CXR 4/18 - COPD, no active disease. UA 4/19 not concerning for infection  Dispo:Palliative following - all interventions to be cleared with HPOA. DNR. Pursuing comfort care - SW is exploring hospice options    LOS: 9 days    Winferd Humphrey, [facility] Surgery 10/25/2020, 7:37 AM Please see Amion for pager number during  day hours 7:00am-4:30pm

## 2020-10-25 NOTE — Discharge Summary (Addendum)
Physician Discharge Summary  Patient ID: Leslie Boyd MRN: [id number] DOB/AGE: 03-12-1946 75 y.o.  Admit date: 10/16/2020 Discharge date: 10/25/2020  Discharge Diagnoses Patient Active Problem List   Diagnosis Date Noted  . Protein-calorie malnutrition, severe 10/17/2020  . ICH (intracerebral hemorrhage) ([place]) 10/16/2020  . Renal mass, left 08/30/2020  . Dementia with behavioral disturbance ([place]) 12/08/2019  . GAD (generalized anxiety disorder) 12/08/2019  . Gastroesophageal reflux disease without esophagitis 12/08/2019  . Intentional drug overdose ([place]) 12/08/2019  . Cognitive impairment 11/16/2019  . Dermatochalasis of both upper eyelids 04/26/2019  . Keratoconjunctivitis sicca of both eyes not specified as Sjogren's 04/26/2019  . Adenocarcinoma of left lung, stage 1 ([place]) 05/07/2018  . Aortic atherosclerosis ([place]) 03/15/2018  . Substance abuse ([place])   . 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 Vitamin D deficiency 07/21/2017  . Coronary artery calcification seen on CAT scan 07/15/2017  . Episodic tobacco dependence 09/01/2016  . Essential hypertension 09/01/2016  . Nodule of left lung 09/01/2016  . Hypercholesteremia 07/18/2016  . Indigestion 03/17/2016  . Mixed emotional features as adjustment reaction 03/17/2016  . Osteopenia 03/17/2016    Consultants Neurosurgery ENT Neurology Oncology Rehabilitation Palliative Care  HPI: Patient is a 75 year old female who was found down by her husband this AM. Patient's husband found her around 4 AM on the floor in his room. Reportedly, he went to bed around 1:30 AM and patient was in her bed upstairs at that time. Patient reportedly sleeps in an upstairs bedroom and the husband sleeps downstairs. Husband reported that patient was in and out of consciousness and appeared to have head trauma. He noted that the patient had dried blood present in nose. Patient reportedly has been in normal state of health other than  sleeping more and not wanting to eat. Husband reported that he and son manage her medications because she has overdosed in the past. Patient complained of neck pain on arrival. Patient reportedly does not take any anticoagulants. Workup at [facility] was significant for [facility], C2 fracture and left sided facial fractures. Concern for possible MCA thrombus on initial imaging, CTA obtained that did not show thrombus. Patient transferred to [facility] for further workup and upgraded to level 1 trauma. On arrival patient was alert and able to follow commands. Airway intact and oxygenating well. No external sources of hemorrhage identified. Patient taken to CT scanner for brain perfusion scan to better identify if MCA thrombus is present. ENT and NS already aware of patient and to see today. Neuro and IR aware of patient and following for results of brain perfusion scan.   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 Allergy listed to zofran and intolerance listed to morphine. No blood thinners in med list.   Hospital Course: Patient admitted to trauma service for further evaluation and care.  ENT and neurosurgery were consulted and recommended nonoperative management.  Brain perfusion study not consistent with MCA thrombus.  Admitted to ICU and evaluated by TBI team therapies.  Initial recommendations were for inpatient rehab however patient declined and recommendations were changed to SNF.  Oncology was consulted during admission due to her following with them outpatient - history of metastatic renal cell carcinoma.  Palliative care was consulted and after further discussion with family ultimately decision was made to transition to comfort care.  Patient will discharge to hospice.   Allergies as of 10/25/2020  Reactions   Ondansetron Hcl Other (See Comments)   Excessive sweating   Morphine Nausea And Vomiting   Morphine And Related Nausea  And Vomiting, Rash      Medication List    STOP taking these medications   acetaminophen 500 MG tablet Commonly known as: TYLENOL   amLODipine-atorvastatin 5-20 MG tablet Commonly known as: CADUET   cholecalciferol 25 MCG (1000 UNIT) tablet Commonly known as: VITAMIN D3   famotidine 20 MG tablet Commonly known as: PEPCID   HYDROcodone-acetaminophen 5-325 MG tablet Commonly known as: NORCO/VICODIN   LORazepam 0.5 MG tablet Commonly known as: ATIVAN   mirtazapine 15 MG tablet Commonly known as: REMERON   OLANZapine 7.5 MG tablet Commonly known as: ZYPREXA   promethazine 12.5 MG tablet Commonly known as: PHENERGAN   promethazine 25 MG suppository Commonly known as: PHENERGAN   sertraline 100 MG tablet Commonly known as: ZOLOFT         Follow-up Information    Jerrell Belfast, MD. Call.   Specialty: Otolaryngology Why: Call as needed  Contact information: [address] [place] [address] [phone]        Consuella Lose, MD Follow up.   Specialty: Neurosurgery Why: Call as needed  Contact information: [address] [place] [address] [phone]        Doug Sou B, DO. Call.   Specialty: Internal Medicine Why: Call as needed  Contact information: [address] [place] [address] [phone]               Signed: Caroll Rancher [facility] Surgery 10/25/2020, 12:06 PM Please see Amion for pager number during day hours 7:00am-4:30pm

## 2020-10-25 NOTE — Progress Notes (Signed)
Orthopedic Tech Progress Note Patient Details:  Leslie Boyd 26-Dec-1945 [id number]  Ortho Devices Type of Ortho Device: Soft collar Ortho Device/Splint Location: neck Ortho Device/Splint Interventions: Ordered,Application   Post Interventions Patient Tolerated: Well Instructions Provided: Care of device,Adjustment of device,Poper ambulation with device   Sua Spadafora 10/25/2020, 7:10 PM

## 2020-10-25 NOTE — Plan of Care (Signed)
  Problem: Education: Goal: Knowledge of General Education information will improve Description: Including pain rating scale, medication(s)/side effects and non-pharmacologic comfort measures Outcome: Not Progressing   Problem: Health Behavior/Discharge Planning: Goal: Ability to manage health-related needs will improve Outcome: Not Progressing   

## 2020-10-25 NOTE — Progress Notes (Signed)
    Progress Note from the Palliative Medicine Team at [facility]   Patient Name: Leslie Boyd        Date: 10/25/2020 DOB: 13-Nov-1945  Age: 75 y.o. MRN#: [id number] Attending Physician: Particia Jasper, MD Primary Care Physician: Nicola Girt, DO Admit Date: 10/16/2020   Medical records reviewed   75 y.o.femaleadmitted on 4/12/2022with  after being found down, presenting to [facility] with SAH, C2 fx, facial fx, and concern for M2/3 thrombus. Patient emergently transported as L1A for possible neuro IR intervention. Discussed case with both NSGY (Dr. Zada Finders) and Neuro IR (Dr. Estanislado Pandy) and plan for CT perfusion scan at presentation. Patient presented confused, but moves all extremities symmetrically, and f/c. CTA obtained prior to transport negative for thrombus.CT perfusion scan negative.  This NP visited patient at the bedside as a follow for palliative medicine  needs and emotional support.  Social work has been in discussion with family regarding transition to residential hospice of [facility].  Patient is intermittently  agitated.  Education to bedside RN on utilization of prn medications  for symptom management  Spoke with daughter/ Leslie Boyd/HPOA by telephone.  Focus of care is comfort, quality and dignity.   Today I also was able to meet the patient's son and husband at bedside.  No questions or concerns at this time.  Plan of Care:    Comfort, quality and dignity-allow for natural death -DNR/DNI -No artificial feeding or hydration now or in the future/diet as tolerated -No further work-up for underlying cancer diagnosis -No further diagnostics; lab draws, x-rays -Minimize medications, no further IV antibiotic use -Symptom management - residential hospice   - Family requesting trauma's input on the option of removing hard collar for comfort purposes  Education offered on the natural trajectory and expectations at EOL.  PMT will continue to support  holitically  Questions and concerns addressed   Discussed with trauma team and bedside RN  Total time spent on the unit was 25 minutes  Greater than 50% of the time was spent in counseling and coordination of care  Wadie Lessen NP  Palliative Medicine Team Team Phone # [phone] Pager [phone]

## 2020-10-25 NOTE — TOC Transition Note (Signed)
Transition of Care [facility]) - CM/SW Discharge Note   Patient Details  Name: Leslie Boyd MRN: [id number] Date of Birth: 31-Jan-1946  [facility] CM/SW Contact:  Emeterio Reeve, [place] Phone Number: 10/25/2020, 2:27 PM   Clinical Narrative:     Patient [facility] DC to: [facility] Anticipated DC date: 10/25/20 Family notified: Daughter Marine scientist by: Corey Harold     Per MD patient ready for DC to [place] . RN, patient, patient's family, and facility notified of DC.. DC packet on chart. Ambulance transport requested for patient.      CSW will sign off for now as social work intervention is no longer needed. Please consult [place] again if new needs arise.   Final next level of care: [place] Barriers to Discharge: Barriers Resolved   Patient Goals and CMS Choice   CMS Medicare.gov Compare Post Acute Care list provided to:: Patient Represenative (must comment) (daughter, Chrys Racer) Choice offered to / list presented to : Adult [facility] POA / Guardian (daughter)  Discharge Placement              Patient chooses bed at: Other - please specify in the comment section below: ([facility]) Patient to be transferred to facility by: [place] Name of family member notified: Daughter, Chrys Racer Patient and family notified of of transfer: 10/25/20  Discharge Plan and Services   Discharge Planning Services: CM Consult                                 Social Determinants of Health (SDOH) Interventions     Readmission Risk Interventions No flowsheet data found.  Emeterio Reeve, Latanya Presser, [place] Social Worker [phone]

## 2020-10-25 NOTE — Progress Notes (Signed)
  Speech Language Pathology Treatment: Dysphagia  Patient Details Name: Leslie Boyd MRN: [id number] DOB: 10-24-1945 Today's Date: 10/25/2020 Time: 1435-1450 SLP Time Calculation (min) (ACUTE ONLY): 15 min  Assessment / Plan / Recommendation Clinical Impression  SLP followed up for diet tolerance. Assessed for appropriateness of POs with current puree and thin liquid diet. Pt repositioned upright, eager for water. Assessed with thins via straw, delayed coughing noted with pt deconditioning appreciated. Pt accepted small amounts of puree snack, required significantly extended time for oral propulsion and swallow initiation per palpation. Puree and thin liquid diet remains most appropriate. No further ST needs identified with recent transition to comfort and hospice care as well as goals of care.    HPI HPI: This 75 y.o. female admitted after being found on floor by her spouse.  She was found ot have traumatic SAH, and punctate ICH; C2 fracture, and facial fx.  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  PMH includes: new diagnosis metastatic renal cancer, h/o lung CA, Anxiety, osteopenia, cognitive dysfunction with behavioral changes. Per most recent palliative note, family wishes for comfort and hospice care.      SLP Plan  Discharge SLP treatment due to (comment) (transition to comfort and hospice care)       Recommendations  Diet recommendations: Dysphagia 1 (puree);Thin liquid Liquids provided via: Straw;Cup Medication Administration: Crushed with puree Supervision: Staff to assist with self feeding;Full supervision/cueing for compensatory strategies Compensations: Minimize environmental distractions;Slow rate;Small sips/bites Postural Changes and/or Swallow Maneuvers: Seated upright 90 degrees                Oral Care Recommendations: Oral care BID Follow up Recommendations: Skilled Nursing facility SLP Visit Diagnosis: Dysphagia, oral phase (R13.11);Dysphagia,  unspecified (R13.10) Plan: Discharge SLP treatment due to (comment) (transition to comfort and hospice care)       [place], CCC-SLP Acute Rehabilitation Services   10/25/2020, 3:04 PM

## 2020-10-25 NOTE — Progress Notes (Signed)
Attempted to contact patient's daughter, Leslie Boyd, to provide an update this morning.  Unable to reach her at this time.

## 2020-10-25 NOTE — Progress Notes (Signed)
Patient will be discharged from the hospital to hospice. Dr Marin Olp notified.   Oncology Nurse Navigator Documentation  Oncology Nurse Navigator Flowsheets 10/25/2020  Abnormal Finding Date -  Confirmed Diagnosis Date -  Diagnosis Status -  Expected Surgery Date -  Navigator Follow Up Date: -  Navigator Follow Up Reason: -  Navigation Complete Date: 10/25/2020  Post Navigation: Continue to Follow Patient? No  Reason Not Navigating Patient: Hospice/Death  Navigator Location [facility]  Referral Date to RadOnc/MedOnc -  Navigator Encounter Type Appt/Treatment Plan Review  Telephone -  Patient Visit Type MedOnc  Treatment Phase Other  Barriers/Navigation Needs Coordination of Care;Education;Family Concerns;Personal Conflicts  Education -  Interventions None Required  Acuity Level 4-High Needs (Greater Than 4 Barriers Identified)  Coordination of Care -  Education Method -  Support Groups/Services Friends and Family  Time Spent with Patient 15

## 2020-12-05 DEATH — deceased

## 2021-09-07 IMAGING — CT CT HEAD W/O CM
4 of 5 series · 14 of 47 positions shown, 16 images · non-contrast
Comparison: 10/16/2020

CLINICAL DATA: Subarachnoid hemorrhage

EXAM:
CT HEAD WITHOUT CONTRAST
TECHNIQUE: Contiguous axial images were obtained from the base of the skull
through the vertex without intravenous contrast.

[Series 4: head wo · axial · 0.43mm/px · z∈[-188,-122]mm · 3 of 34 slices shown]
[im 7/34  brain]
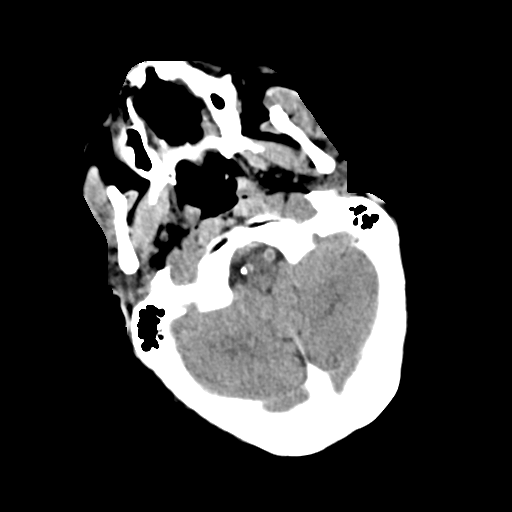
[im 14/34  brain]
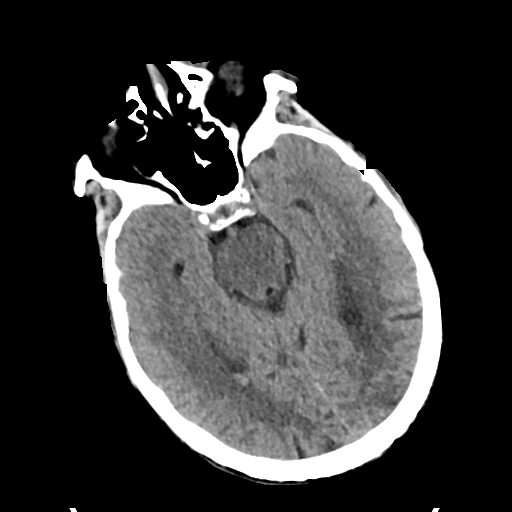
[im 20/34  brain]
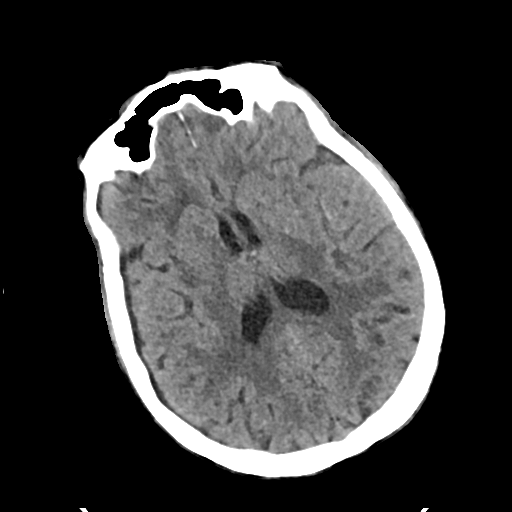

[Series 5: cor soft · coronal · 0.32mm/px · 3 of 66 slices shown]
[im 22/66  brain]
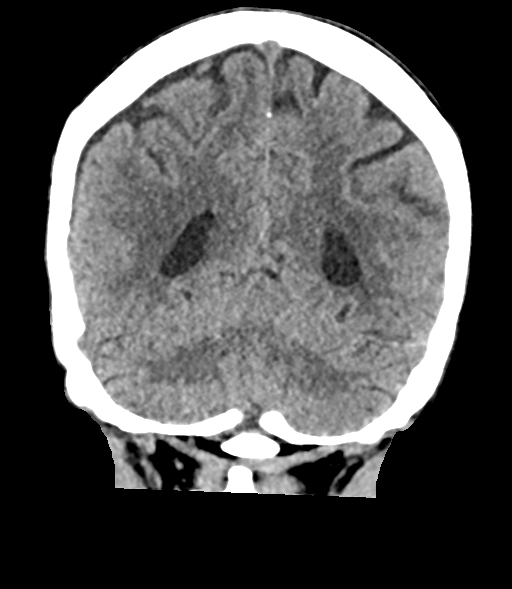
[im 29/66  brain]
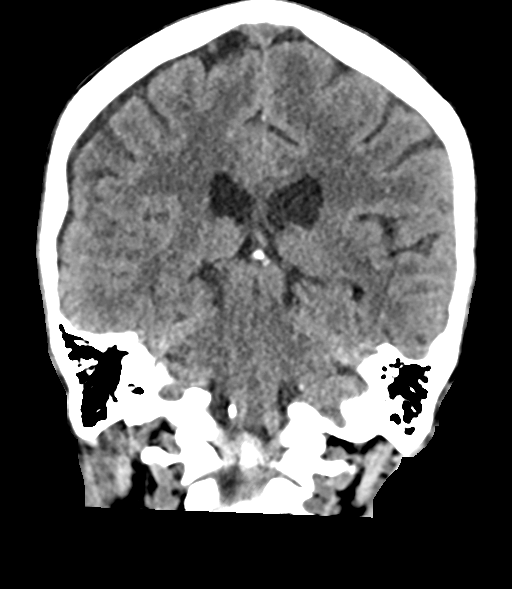
[im 37/66  brain]
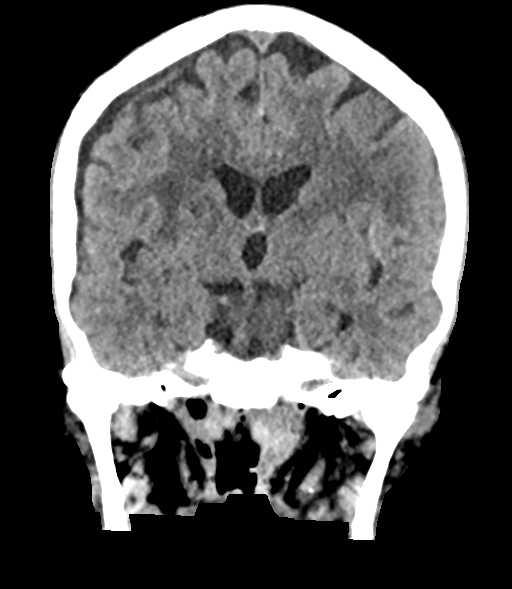

[Series 6: sag soft · sagittal · 0.37mm/px · 3 of 49 slices shown]
[im 17/49  brain]
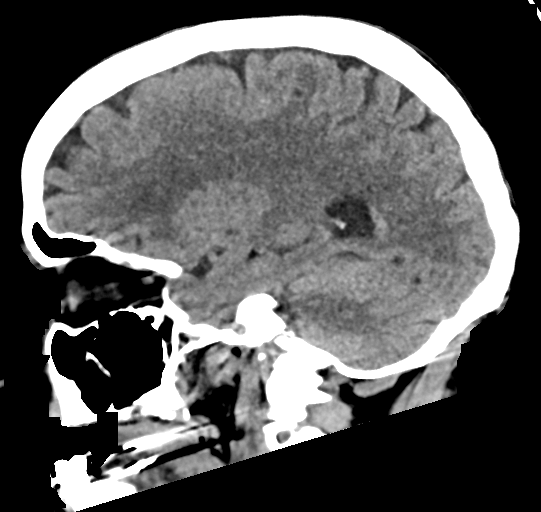
[im 25/49  brain]
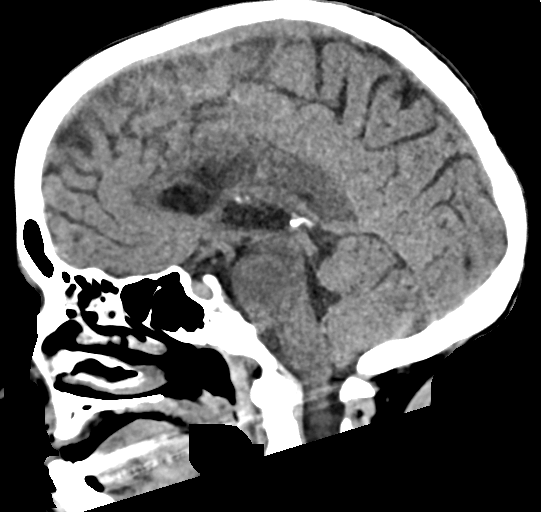
[im 33/49  brain]
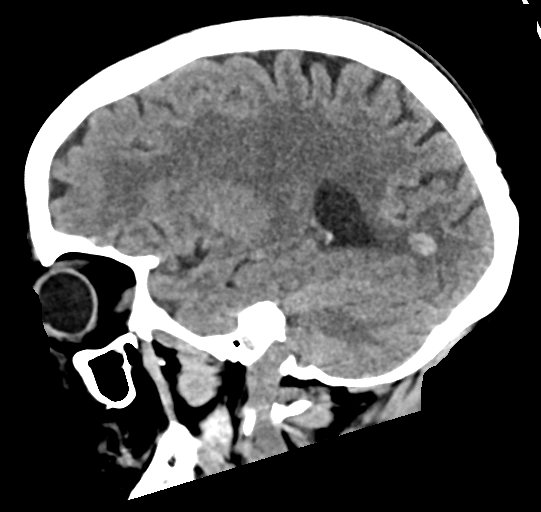

[Series 7: head wo true · axial · 0.32mm/px · z∈[-182,-63]mm · 5 of 39 slices shown, 7 images]
[im 7/39  brain]
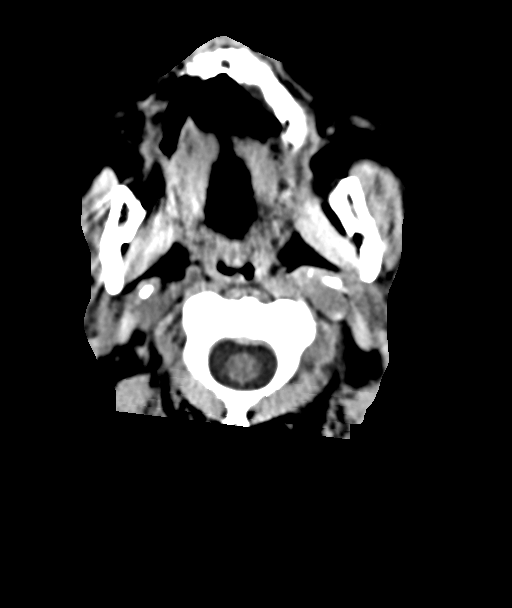
[im 7/39  bone]
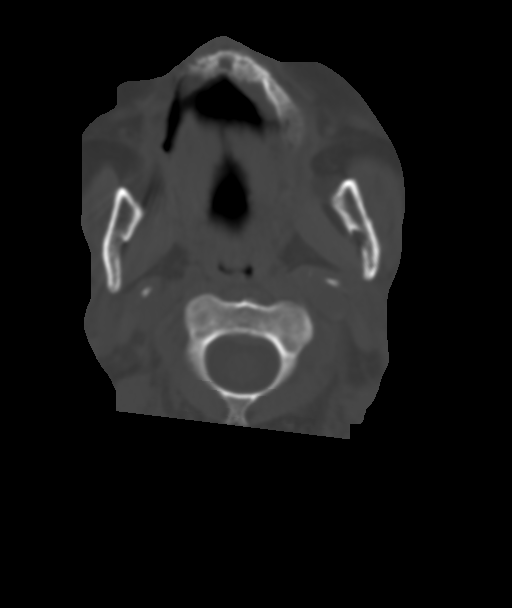
[im 13/39  brain]
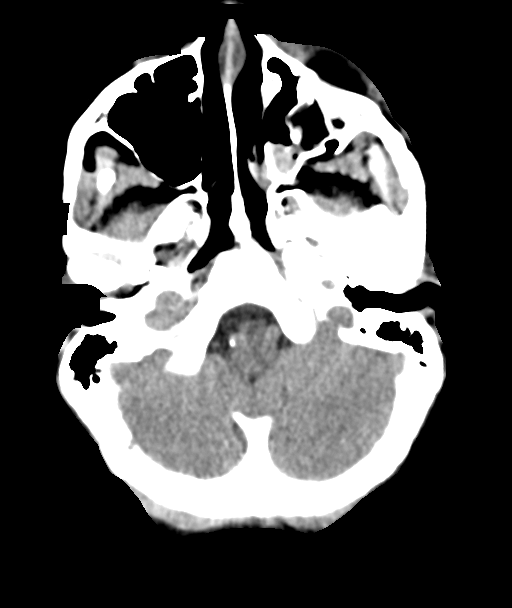
[im 20/39  brain]
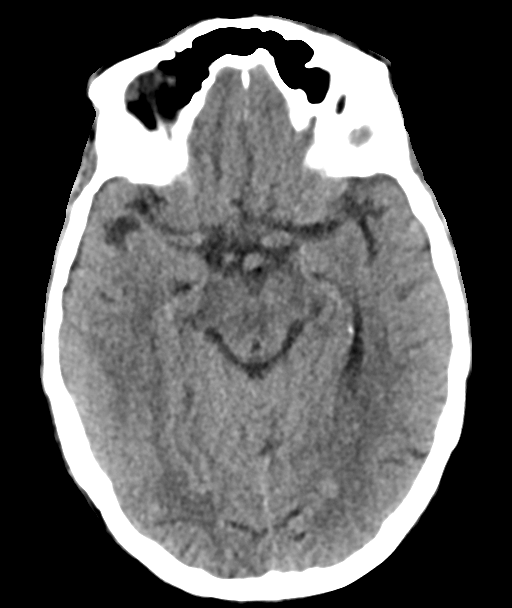
[im 26/39  brain]
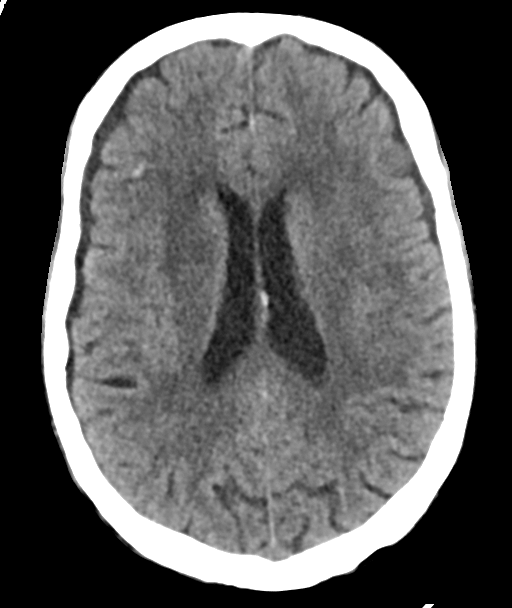
[im 32/39  brain]
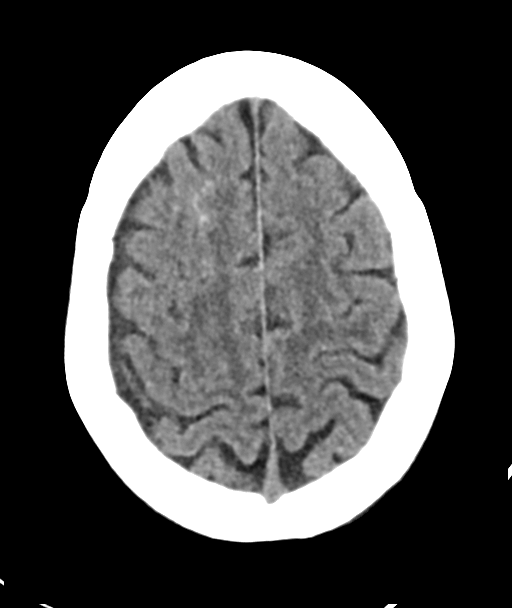
[im 32/39  bone]
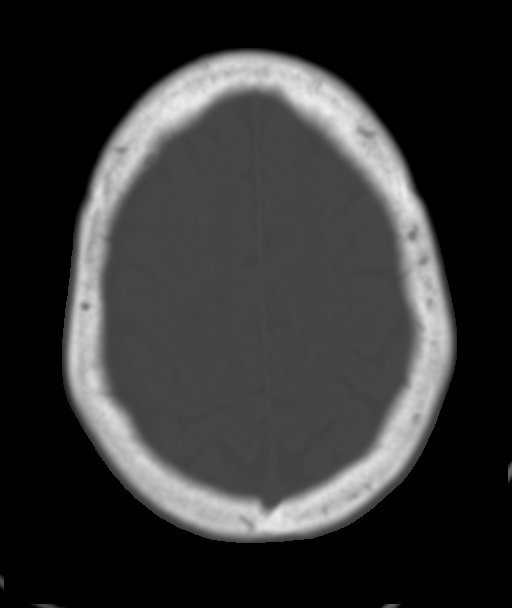

[14 of 47 positions shown; findings below may reference images not displayed]

FINDINGS: Brain: Normal anatomic configuration of the brain. Small
subarachnoid hemorrhage seen within the sulci of the right frontal
lobe appears stable. Punctate foci of hemorrhage within the frontal
lobes bilaterally at the gray-white matter junction in keeping with
probable axonal shear injury/DA I are again identified and are
stable. Small intraventricular hemorrhage has developed in the
interval, however, layering within the occipital horns of the
lateral ventricles bilaterally.

Small right crescentic subdural hematoma overlying the right
cerebral convexity demonstrating relatively low attenuation is
unchanged in keeping with a small chronic subdural hematoma.

No abnormal mass effect or midline shift. Mild periventricular white
matter changes are again identified in keeping with changes of
probable small vessel ischemia. No acute infarct. No abnormal intra
or extra-axial mass lesion. Cerebellum unremarkable. Ventricular
size is normal.

Vascular: Hyperattenuation of several left MCA branches within the
sylvian fissure is again identified likely representing intracranial
atherosclerotic disease given the lack of intraluminal thrombus
noted on recent CT arteriography. No asymmetric hyperdense
vasculature at the skull base.

Skull: Intact.

Sinuses/Orbits: Fractures of the anterior and lateral walls of the a
maxillary antrum, the left zygomatic arch, the left inferior,
lateral, and medial orbital walls are again identified in keeping
with a combination orbital blowout/zygomaticomaxillary complex
fracture. Extensive left preseptal soft tissue swelling again noted.
This appears grossly unchanged from prior examination.

Other: Mastoid air cells and middle ear cavities are clear.
IMPRESSION: Stable subarachnoid hemorrhage and punctate foci of intraparenchymal
hemorrhage in keeping with axonal shear injury. Interval development
of small intraventricular hemorrhage. Normal ventricular size.

No abnormal mass effect or midline shift.

Stable probable chronic small right subdural hematoma

Grossly stable left orbital blowout/zygomaticomaxillary complex
fracture

These results will be called to the ordering clinician or
representative by the Radiologist Assistant, and communication
documented in the PACS or [REDACTED].

## 2021-09-12 IMAGING — DX DG CHEST 1V PORT
1 series · 1 of 1 positions shown · non-contrast
Comparison: Chest x-ray dated October 16, 2020.

CLINICAL DATA: Leukocytosis.

EXAM:
PORTABLE CHEST 1 VIEW

[chest]
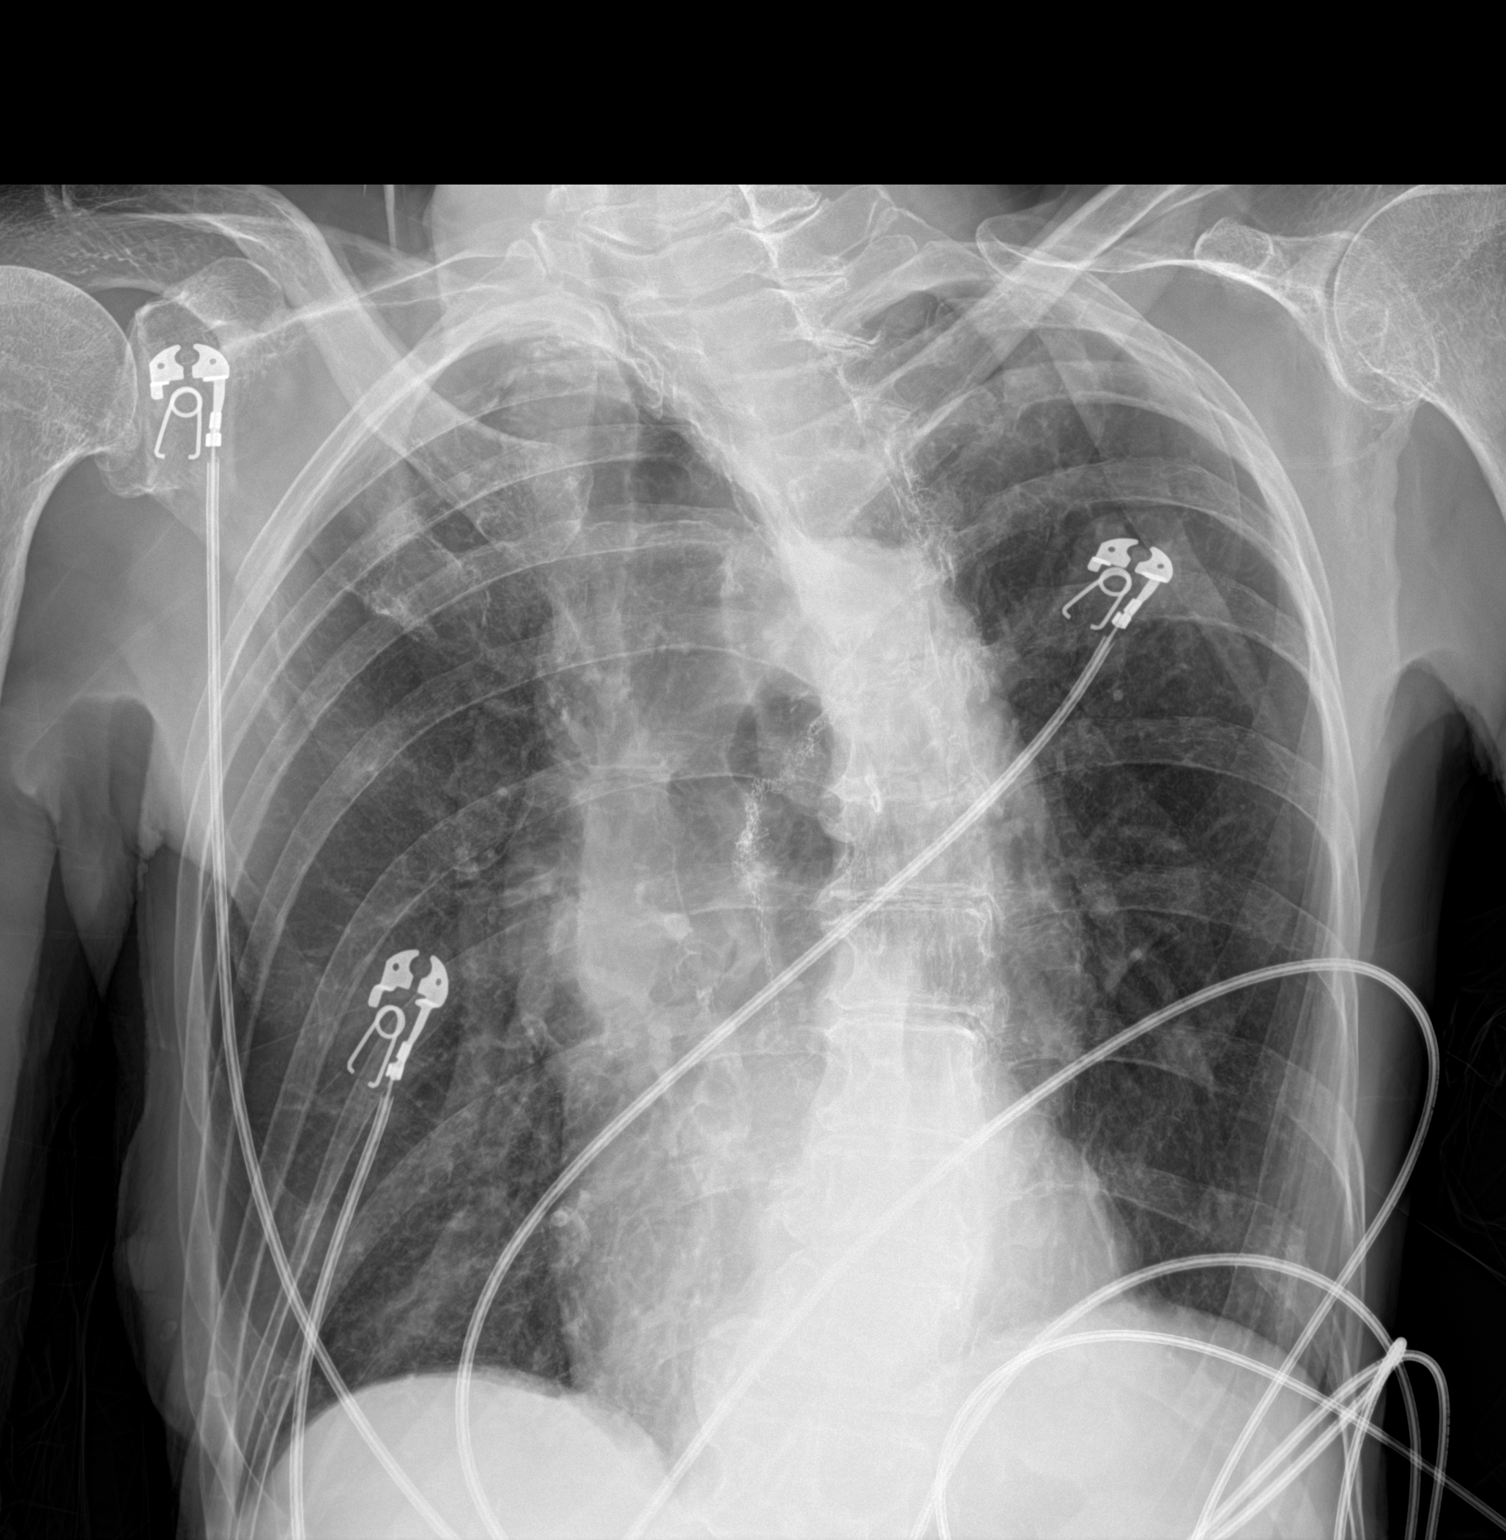

[1 of 1 positions shown; findings below may reference images not displayed]

FINDINGS: The patient is significantly rotated to the right, limiting
evaluation. Stable cardiomediastinal silhouette. Postsurgical
changes in the medial left upper lobe again noted. The lungs remain
hyperinflated No focal consolidation, pleural effusion, or
pneumothorax. No acute osseous abnormality.
IMPRESSION: 1. Limited study due to rotation.  No active disease.
2. COPD.
